# Patient Record
Sex: Female | Born: 1965 | Race: Black or African American | Hispanic: No | Smoking: Never smoker
Health system: Southern US, Community
[De-identification: ages and names within clinical notes are randomized; demographics above are authoritative.]

## PROBLEM LIST (undated history)

## (undated) DIAGNOSIS — T464X5A Adverse effect of angiotensin-converting-enzyme inhibitors, initial encounter: Secondary | ICD-10-CM

## (undated) DIAGNOSIS — R05 Cough: Secondary | ICD-10-CM

## (undated) DIAGNOSIS — Z862 Personal history of diseases of the blood and blood-forming organs and certain disorders involving the immune mechanism: Secondary | ICD-10-CM

## (undated) DIAGNOSIS — T7840XA Allergy, unspecified, initial encounter: Secondary | ICD-10-CM

## (undated) DIAGNOSIS — R7611 Nonspecific reaction to tuberculin skin test without active tuberculosis: Secondary | ICD-10-CM

## (undated) DIAGNOSIS — R042 Hemoptysis: Secondary | ICD-10-CM

## (undated) DIAGNOSIS — I10 Essential (primary) hypertension: Secondary | ICD-10-CM

## (undated) DIAGNOSIS — E0590 Thyrotoxicosis, unspecified without thyrotoxic crisis or storm: Secondary | ICD-10-CM

## (undated) HISTORY — DX: Allergy, unspecified, initial encounter: T78.40XA

## (undated) HISTORY — DX: Adverse effect of angiotensin-converting-enzyme inhibitors, initial encounter: T46.4X5A

## (undated) HISTORY — DX: Personal history of diseases of the blood and blood-forming organs and certain disorders involving the immune mechanism: Z86.2

## (undated) HISTORY — DX: Thyrotoxicosis, unspecified without thyrotoxic crisis or storm: E05.90

## (undated) HISTORY — DX: Cough: R05

## (undated) HISTORY — DX: Nonspecific reaction to tuberculin skin test without active tuberculosis: R76.11

## (undated) HISTORY — DX: Hemoptysis: R04.2

## (undated) HISTORY — PX: TUBAL LIGATION: SHX77

---

## 1997-05-17 ENCOUNTER — Encounter: Admission: RE | Admit: 1997-05-17 | Discharge: 1997-05-17 | Payer: Self-pay | Admitting: Family Medicine

## 1997-05-23 ENCOUNTER — Encounter: Admission: RE | Admit: 1997-05-23 | Discharge: 1997-05-23 | Payer: Self-pay | Admitting: Family Medicine

## 1997-05-31 ENCOUNTER — Encounter: Admission: RE | Admit: 1997-05-31 | Discharge: 1997-05-31 | Payer: Self-pay | Admitting: Family Medicine

## 1997-06-23 ENCOUNTER — Encounter: Admission: RE | Admit: 1997-06-23 | Discharge: 1997-06-23 | Payer: Self-pay | Admitting: Family Medicine

## 1997-07-12 ENCOUNTER — Encounter: Admission: RE | Admit: 1997-07-12 | Discharge: 1997-07-12 | Payer: Self-pay | Admitting: Family Medicine

## 1997-08-09 ENCOUNTER — Encounter: Admission: RE | Admit: 1997-08-09 | Discharge: 1997-08-09 | Payer: Self-pay | Admitting: Family Medicine

## 2000-03-19 ENCOUNTER — Encounter: Admission: RE | Admit: 2000-03-19 | Discharge: 2000-03-19 | Payer: Self-pay | Admitting: Family Medicine

## 2001-09-03 ENCOUNTER — Ambulatory Visit (HOSPITAL_COMMUNITY): Admission: RE | Admit: 2001-09-03 | Discharge: 2001-09-03 | Payer: Self-pay | Admitting: Obstetrics

## 2001-09-03 ENCOUNTER — Encounter: Payer: Self-pay | Admitting: Obstetrics

## 2001-11-03 ENCOUNTER — Encounter: Admission: RE | Admit: 2001-11-03 | Discharge: 2001-11-03 | Payer: Self-pay | Admitting: Obstetrics and Gynecology

## 2001-11-10 ENCOUNTER — Encounter: Admission: RE | Admit: 2001-11-10 | Discharge: 2001-11-10 | Payer: Self-pay | Admitting: *Deleted

## 2001-11-12 ENCOUNTER — Ambulatory Visit (HOSPITAL_COMMUNITY): Admission: RE | Admit: 2001-11-12 | Discharge: 2001-11-12 | Payer: Self-pay | Admitting: *Deleted

## 2001-11-17 ENCOUNTER — Encounter: Admission: RE | Admit: 2001-11-17 | Discharge: 2001-11-17 | Payer: Self-pay | Admitting: *Deleted

## 2001-11-24 ENCOUNTER — Encounter: Admission: RE | Admit: 2001-11-24 | Discharge: 2001-11-24 | Payer: Self-pay | Admitting: *Deleted

## 2001-11-25 ENCOUNTER — Encounter (HOSPITAL_COMMUNITY): Admission: AD | Admit: 2001-11-25 | Discharge: 2001-12-02 | Payer: Self-pay | Admitting: *Deleted

## 2001-12-01 ENCOUNTER — Encounter: Admission: RE | Admit: 2001-12-01 | Discharge: 2001-12-01 | Payer: Self-pay | Admitting: *Deleted

## 2001-12-04 ENCOUNTER — Inpatient Hospital Stay (HOSPITAL_COMMUNITY): Admission: AD | Admit: 2001-12-04 | Discharge: 2001-12-07 | Payer: Self-pay | Admitting: Family Medicine

## 2001-12-04 ENCOUNTER — Encounter (INDEPENDENT_AMBULATORY_CARE_PROVIDER_SITE_OTHER): Payer: Self-pay | Admitting: *Deleted

## 2001-12-09 ENCOUNTER — Inpatient Hospital Stay (HOSPITAL_COMMUNITY): Admission: AD | Admit: 2001-12-09 | Discharge: 2001-12-11 | Payer: Self-pay | Admitting: *Deleted

## 2002-06-30 ENCOUNTER — Encounter: Admission: RE | Admit: 2002-06-30 | Discharge: 2002-06-30 | Payer: Self-pay | Admitting: Internal Medicine

## 2002-06-30 ENCOUNTER — Encounter: Payer: Self-pay | Admitting: Internal Medicine

## 2002-11-20 ENCOUNTER — Emergency Department (HOSPITAL_COMMUNITY): Admission: EM | Admit: 2002-11-20 | Discharge: 2002-11-20 | Payer: Self-pay | Admitting: Emergency Medicine

## 2004-03-14 ENCOUNTER — Ambulatory Visit: Payer: Self-pay | Admitting: Family Medicine

## 2005-05-08 ENCOUNTER — Encounter (INDEPENDENT_AMBULATORY_CARE_PROVIDER_SITE_OTHER): Payer: Self-pay | Admitting: *Deleted

## 2005-05-08 ENCOUNTER — Ambulatory Visit: Payer: Self-pay | Admitting: Family Medicine

## 2005-05-12 ENCOUNTER — Ambulatory Visit (HOSPITAL_COMMUNITY): Admission: RE | Admit: 2005-05-12 | Discharge: 2005-05-12 | Payer: Self-pay | Admitting: *Deleted

## 2006-05-13 ENCOUNTER — Ambulatory Visit: Payer: Self-pay | Admitting: Obstetrics and Gynecology

## 2006-11-12 ENCOUNTER — Ambulatory Visit (HOSPITAL_COMMUNITY): Admission: RE | Admit: 2006-11-12 | Discharge: 2006-11-12 | Payer: Self-pay | Admitting: Family Medicine

## 2009-04-24 ENCOUNTER — Emergency Department (HOSPITAL_COMMUNITY): Admission: EM | Admit: 2009-04-24 | Discharge: 2009-04-24 | Payer: Self-pay | Admitting: Emergency Medicine

## 2010-01-30 ENCOUNTER — Ambulatory Visit
Admission: RE | Admit: 2010-01-30 | Discharge: 2010-01-30 | Payer: Self-pay | Source: Home / Self Care | Attending: Obstetrics and Gynecology | Admitting: Obstetrics and Gynecology

## 2010-02-04 LAB — POCT URINALYSIS DIPSTICK
Bilirubin Urine: NEGATIVE
Ketones, ur: NEGATIVE mg/dL
Nitrite: NEGATIVE
Protein, ur: NEGATIVE mg/dL
Specific Gravity, Urine: 1.025 (ref 1.005–1.030)
Urine Glucose, Fasting: NEGATIVE mg/dL
Urobilinogen, UA: 0.2 mg/dL (ref 0.0–1.0)
pH: 6 (ref 5.0–8.0)

## 2010-02-14 ENCOUNTER — Ambulatory Visit: Admit: 2010-02-14 | Payer: Self-pay | Admitting: Obstetrics and Gynecology

## 2010-03-15 ENCOUNTER — Encounter (HOSPITAL_COMMUNITY): Payer: Self-pay | Admitting: Radiology

## 2010-03-15 ENCOUNTER — Inpatient Hospital Stay (HOSPITAL_COMMUNITY)
Admission: EM | Admit: 2010-03-15 | Discharge: 2010-03-19 | DRG: 204 | Disposition: A | Discharge status: Home / Self Care | Payer: Medicaid Other | Attending: Pulmonary Disease | Admitting: Pulmonary Disease

## 2010-03-15 ENCOUNTER — Emergency Department (HOSPITAL_COMMUNITY): Payer: Medicaid Other

## 2010-03-15 DIAGNOSIS — J189 Pneumonia, unspecified organism: Secondary | ICD-10-CM

## 2010-03-15 DIAGNOSIS — Z7982 Long term (current) use of aspirin: Secondary | ICD-10-CM

## 2010-03-15 DIAGNOSIS — I10 Essential (primary) hypertension: Secondary | ICD-10-CM | POA: Diagnosis present

## 2010-03-15 DIAGNOSIS — R042 Hemoptysis: Principal | ICD-10-CM | POA: Diagnosis present

## 2010-03-15 DIAGNOSIS — R7611 Nonspecific reaction to tuberculin skin test without active tuberculosis: Secondary | ICD-10-CM | POA: Diagnosis present

## 2010-03-15 DIAGNOSIS — Z23 Encounter for immunization: Secondary | ICD-10-CM

## 2010-03-15 DIAGNOSIS — R918 Other nonspecific abnormal finding of lung field: Secondary | ICD-10-CM | POA: Diagnosis present

## 2010-03-15 HISTORY — DX: Essential (primary) hypertension: I10

## 2010-03-15 LAB — CBC
HCT: 28.9 % — ABNORMAL LOW (ref 36.0–46.0)
Hemoglobin: 8.6 g/dL — ABNORMAL LOW (ref 12.0–15.0)
MCH: 21.6 pg — ABNORMAL LOW (ref 26.0–34.0)
MCHC: 29.8 g/dL — ABNORMAL LOW (ref 30.0–36.0)
MCV: 72.6 fL — ABNORMAL LOW (ref 78.0–100.0)
Platelets: 246 10*3/uL (ref 150–400)
RBC: 3.98 MIL/uL (ref 3.87–5.11)
RDW: 16.8 % — ABNORMAL HIGH (ref 11.5–15.5)
WBC: 10.9 10*3/uL — ABNORMAL HIGH (ref 4.0–10.5)

## 2010-03-15 LAB — DIFFERENTIAL
Basophils Absolute: 0 10*3/uL (ref 0.0–0.1)
Basophils Relative: 0 % (ref 0–1)
Eosinophils Absolute: 0 10*3/uL (ref 0.0–0.7)
Eosinophils Relative: 0 % (ref 0–5)
Lymphocytes Relative: 11 % — ABNORMAL LOW (ref 12–46)
Lymphs Abs: 1.2 10*3/uL (ref 0.7–4.0)
Monocytes Absolute: 0.5 10*3/uL (ref 0.1–1.0)
Monocytes Relative: 5 % (ref 3–12)
Neutro Abs: 9.2 10*3/uL — ABNORMAL HIGH (ref 1.7–7.7)
Neutrophils Relative %: 84 % — ABNORMAL HIGH (ref 43–77)

## 2010-03-15 LAB — POCT I-STAT, CHEM 8
BUN: 11 mg/dL (ref 6–23)
Calcium, Ion: 1.17 mmol/L (ref 1.12–1.32)
Chloride: 105 mEq/L (ref 96–112)
Creatinine, Ser: 0.7 mg/dL (ref 0.4–1.2)
Glucose, Bld: 152 mg/dL — ABNORMAL HIGH (ref 70–99)
HCT: 30 % — ABNORMAL LOW (ref 36.0–46.0)
Hemoglobin: 10.2 g/dL — ABNORMAL LOW (ref 12.0–15.0)
Potassium: 3.7 mEq/L (ref 3.5–5.1)
Sodium: 141 mEq/L (ref 135–145)
TCO2: 22 mmol/L (ref 0–100)

## 2010-03-15 LAB — COMPREHENSIVE METABOLIC PANEL
ALT: 41 U/L — ABNORMAL HIGH (ref 0–35)
AST: 33 U/L (ref 0–37)
Albumin: 2.9 g/dL — ABNORMAL LOW (ref 3.5–5.2)
Alkaline Phosphatase: 58 U/L (ref 39–117)
BUN: 9 mg/dL (ref 6–23)
CO2: 24 mEq/L (ref 19–32)
Calcium: 8.9 mg/dL (ref 8.4–10.5)
Chloride: 108 mEq/L (ref 96–112)
Creatinine, Ser: 0.66 mg/dL (ref 0.4–1.2)
GFR calc Af Amer: >60 mL/min (ref >60)
GFR calc non Af Amer: >60 mL/min (ref >60)
Glucose, Bld: 85 mg/dL (ref 70–99)
Potassium: 3.7 mEq/L (ref 3.5–5.1)
Sodium: 141 mEq/L (ref 135–145)
Total Bilirubin: 0.3 mg/dL (ref 0.3–1.2)
Total Protein: 6.3 g/dL (ref 6.0–8.3)

## 2010-03-15 LAB — RAPID URINE DRUG SCREEN, HOSP PERFORMED
Amphetamines: NOT DETECTED
Barbiturates: NOT DETECTED
Benzodiazepines: NOT DETECTED
Cocaine: NOT DETECTED
Opiates: NOT DETECTED
Tetrahydrocannabinol: NOT DETECTED

## 2010-03-15 LAB — BRAIN NATRIURETIC PEPTIDE: Pro B Natriuretic peptide (BNP): 183 pg/mL — ABNORMAL HIGH (ref 0.0–100.0)

## 2010-03-15 LAB — POCT CARDIAC MARKERS
CKMB, poc: <1 ng/mL — ABNORMAL LOW (ref 1.0–8.0)
Myoglobin, poc: 73.4 ng/mL (ref 12–200)
Troponin i, poc: <0.05 ng/mL (ref 0.00–0.09)

## 2010-03-15 LAB — ABO/RH: ABO/RH(D): B POS

## 2010-03-15 LAB — PROTIME-INR
INR: 1.09 (ref 0.00–1.49)
Prothrombin Time: 14.3 seconds (ref 11.6–15.2)

## 2010-03-15 LAB — SEDIMENTATION RATE: Sed Rate: 30 mm/hr — ABNORMAL HIGH (ref 0–22)

## 2010-03-15 LAB — RHEUMATOID FACTOR: Rheumatoid fact SerPl-aCnc: <10 IU/mL (ref <=14)

## 2010-03-15 LAB — D-DIMER, QUANTITATIVE: D-Dimer, Quant: 0.9 ug/mL-FEU — ABNORMAL HIGH (ref 0.00–0.48)

## 2010-03-15 LAB — APTT: aPTT: 24 seconds (ref 24–37)

## 2010-03-15 MED ORDER — IOHEXOL 300 MG/ML  SOLN
100.0000 mL | Freq: Once | INTRAMUSCULAR | Status: AC | PRN
  Administered 2010-03-15: 100 mL via INTRAVENOUS

## 2010-03-15 MED ORDER — IOHEXOL 350 MG/ML SOLN
100.0000 mL | Freq: Once | INTRAVENOUS | Status: AC | PRN
  Administered 2010-03-15: 100 mL via INTRAVENOUS

## 2010-03-15 MED ORDER — IOHEXOL 300 MG/ML  SOLN: 100.0000 mL | Freq: Once | INTRAMUSCULAR | Status: AC | PRN

## 2010-03-16 ENCOUNTER — Inpatient Hospital Stay (HOSPITAL_COMMUNITY): Payer: Medicaid Other

## 2010-03-16 LAB — URINALYSIS, ROUTINE W REFLEX MICROSCOPIC
Bilirubin Urine: NEGATIVE
Hgb urine dipstick: NEGATIVE
Specific Gravity, Urine: 1.017 (ref 1.005–1.030)
pH: 7.5 (ref 5.0–8.0)

## 2010-03-17 LAB — DIFFERENTIAL
Basophils Relative: 0 % (ref 0–1)
Lymphocytes Relative: 30 % (ref 12–46)
Monocytes Relative: 11 % (ref 3–12)
Neutro Abs: 4.5 10*3/uL (ref 1.7–7.7)

## 2010-03-17 LAB — CBC
Hemoglobin: 8 g/dL — ABNORMAL LOW (ref 12.0–15.0)
MCH: 21.3 pg — ABNORMAL LOW (ref 26.0–34.0)
MCV: 72.3 fL — ABNORMAL LOW (ref 78.0–100.0)
RBC: 3.75 MIL/uL — ABNORMAL LOW (ref 3.87–5.11)

## 2010-03-17 LAB — C4 COMPLEMENT: Complement C4, Body Fluid: 29 mg/dL (ref 16–47)

## 2010-03-18 ENCOUNTER — Inpatient Hospital Stay (HOSPITAL_COMMUNITY): Payer: Medicaid Other

## 2010-03-18 ENCOUNTER — Encounter (HOSPITAL_COMMUNITY): Payer: Self-pay

## 2010-03-18 DIAGNOSIS — R7611 Nonspecific reaction to tuberculin skin test without active tuberculosis: Secondary | ICD-10-CM

## 2010-03-18 LAB — BASIC METABOLIC PANEL
CO2: 25 mEq/L (ref 19–32)
Calcium: 9 mg/dL (ref 8.4–10.5)
Chloride: 110 mEq/L (ref 96–112)
Glucose, Bld: 97 mg/dL (ref 70–99)
Sodium: 139 mEq/L (ref 135–145)

## 2010-03-18 LAB — STREP PNEUMONIAE URINARY ANTIGEN: Strep Pneumo Urinary Antigen: NEGATIVE

## 2010-03-18 LAB — CBC
HCT: 27.9 % — ABNORMAL LOW (ref 36.0–46.0)
Hemoglobin: 8.3 g/dL — ABNORMAL LOW (ref 12.0–15.0)
MCHC: 29.7 g/dL — ABNORMAL LOW (ref 30.0–36.0)
RBC: 3.83 MIL/uL — ABNORMAL LOW (ref 3.87–5.11)

## 2010-03-19 LAB — CROSSMATCH
ABO/RH(D): B POS
Antibody Screen: NEGATIVE
Unit division: 0
Unit division: 0

## 2010-03-19 LAB — ANTI-NEUTROPHIL ANTIBODY

## 2010-03-19 LAB — LEGIONELLA ANTIGEN, URINE

## 2010-03-22 LAB — CULTURE, BLOOD (ROUTINE X 2)
Culture  Setup Time: 201202250240
Culture: NO GROWTH

## 2010-03-23 LAB — CULTURE, BLOOD (ROUTINE X 2)
Culture  Setup Time: 201202262354
Culture: NO GROWTH

## 2010-03-28 ENCOUNTER — Encounter: Payer: Self-pay | Admitting: Internal Medicine

## 2010-03-28 ENCOUNTER — Inpatient Hospital Stay (INDEPENDENT_AMBULATORY_CARE_PROVIDER_SITE_OTHER): Payer: Self-pay | Admitting: Internal Medicine

## 2010-03-28 DIAGNOSIS — I5030 Unspecified diastolic (congestive) heart failure: Secondary | ICD-10-CM | POA: Insufficient documentation

## 2010-03-28 DIAGNOSIS — R042 Hemoptysis: Secondary | ICD-10-CM

## 2010-03-28 DIAGNOSIS — R079 Chest pain, unspecified: Secondary | ICD-10-CM

## 2010-03-28 DIAGNOSIS — R0602 Shortness of breath: Secondary | ICD-10-CM

## 2010-03-28 DIAGNOSIS — I10 Essential (primary) hypertension: Secondary | ICD-10-CM | POA: Insufficient documentation

## 2010-03-28 HISTORY — DX: Hemoptysis: R04.2

## 2010-04-02 NOTE — Assessment & Plan Note (Addendum)
Summary: hfu per steve/mhh   Visit Type:  Hospital Follow-up Primary Provider/Referring Provider:  Dr. Sylvester Harder  CC:  HFU.  Pt c/o chest pain on left side and  SOB. Marland Kitchen  History of Present Illness: March  48, 578  44 year old lady. 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 Knonw hypertension untreated x several years. 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 Workup negative (see spl past med) except noted to have hypertension and diasth chf and mild MR on echo. PPD was positive again as expected though but pulmnary inflrates rapidly improvd in hospital and she did not have B symptoms - this made [place] diagnose diast chf due to obesity and BP as the etiology.  Dc;ed on antihypertensive.Now here for followup  Today she states that  she is still very dyspneic. Feels it somewha worrse after dc. Dyspnea for climbing few stairs. No further hemoptysis. Compliant with labetalol. Also, new left infraclavicular pain for 3 days, constant, made worse with raising hands and radiationg to back, moderate in severity. No relieving factors. No associated edema, palpitaitons  Preventive Screening-Counseling & Management  Alcohol-Tobacco     Smoking Status: never  Current Medications (verified): 1)  Labetalol Hcl 200 Mg Tabs (Labetalol Hcl) .... Take 1 Tablet By Mouth Two Times A Day  Allergies (verified): No Known Drug Allergies  Past History:  Past medical, surgical, family and social histories (including risk factors) reviewed, and no changes noted (except as noted below).  Past Medical History: Hypertension  - diagnosed several decades ago  - untreated till admission Feb 2012 Diast CHF  - on echo feb 2012 - new Hemoptysis due to alv hge   - feb 2012. ANA negative. Deemed due to dias chf PPD positive  - negative during childhood in [facility]  - first positive test 1993 during immigration green card clearance. CXR normal 1993 per hx  - s/p INR Rx at [facility] - 6 months  - repeat positive feb 2012  Past Surgical History: c-section  Past Pulmonary History:  Pulmonary History: CT scan showed diffuse bilateral alveolar infiltrates.   Hemoglobin 8.3, hematocrit 27.9, platelets 263, WBC 6.3.  Sodium 139,   potassium 3.5, chloride 110, CO2 is 25, BUN is 5, creatinine 0.61,   glucose 97, albumin is 2.9, calcium is 8.9.  Urine Legionella is   negative.  Strep urine is negative.  Blood cultures showed no growth.   AFBs are pending.  Blood culture showed no growth.  PPD was positive.   Complement C3 was 142, C4 was 429.  Urine drug screen was negative.  HIV   was negative.  BNP was 183.  D-dimer is 0.90 and sed rate was 30.  RA   factor was less than 10.  HIV was nonreactive.  ANA was negative.   Cardiac markers were negative.  Myoglobin 73.9.    Note that her PPD was   positive.   ECHO - diast dysfn  Family History: Reviewed history and no changes required. Father-HTN  Social History: Reviewed history and no changes required. Patient never smoked.  Married has children Smoking Status:  never  Review of Systems       The patient complains of shortness of breath with activity and chest pain.  The patient denies shortness of breath at rest, productive cough, non-productive cough, coughing up blood, irregular heartbeats, acid heartburn, indigestion, loss of appetite, weight change, abdominal pain, difficulty swallowing, sore throat, tooth/dental problems,  headaches, nasal congestion/difficulty breathing through nose, sneezing, itching, ear ache, anxiety, depression, hand/feet swelling, joint stiffness or pain, rash, change in color of mucus, and fever.    Vital Signs:  Patient profile:   45 year old female Height:      65 inches Weight:      221.13 pounds BMI:     36.93 O2 Sat:      99 % on Room air Temp:     98.5 degrees F oral Pulse rate:   87 / minute BP sitting:   142 / 78  (right arm) Cuff size:   regular  Vitals  Entered By: Carron Curie CMA (March 28, 2010 11:16 AM)  O2 Flow:  Room air CC: HFU.  Pt c/o chest pain on left side and  SOB.  Comments Medications reviewed with patient Carron Curie CMA  March 28, 2010 11:17 AM Daytime phone number verified with patient.    Physical Exam  General:  well developed, well nourished, in no acute distressobese.   Head:  normocephalic and atraumatic Eyes:  PERRLA/EOM intact; conjunctiva and sclera clear Ears:  TMs intact and clear with normal canals Nose:  no deformity, discharge, inflammation, or lesions Mouth:  no deformity or lesions Neck:  no masses, thyromegaly, or abnormal cervical nodes Chest Wall:  left rib side tender Lungs:  clear bilaterally to auscultation and percussion Heart:  regular rhythm and normal rate.  systolic mumru + Abdomen:  bowel sounds positive; abdomen soft and non-tender without masses, or organomegaly Msk:  no deformity or scoliosis noted with normal posture Pulses:  pulses normal Extremities:  no clubbing, cyanosis, edema, or deformity noted Neurologic:  CN II-XII grossly intact with normal reflexes, coordination, muscle strength and tone Skin:  intact without lesions or rashes Cervical Nodes:  no significant adenopathy Axillary Nodes:  no significant adenopathy Psych:  alert and cooperative; normal mood and affect; normal attention span and concentration   Impression & Recommendations:  Problem # 1:  UNSPECIFIED DIASTOLIC HEART FAILURE (ICD-428.30) Assessment New clinically better  plan refer cards Her updated medication list for this problem includes:    Labetalol Hcl 200 Mg Tabs (Labetalol hcl) .Marland Kitchen... Take 1 tablet by mouth two times a day  Orders: Cardiology Referral (Cardiology) Est. Patient Level IV (99214)Future Orders: T-2 View CXR (71020TC) ... 04/18/2010  Problem # 2:  CHEST PAIN (ICD-786.50) Assessment: New atypoical. had ruled out for Mi in hospital  plan Orders: Cardiology Referral  (Cardiology) Est. Patient Level IV (45409)  Problem # 3:  DYSPNEA (ICD-786.05) Assessment: New ?du eto diast chf and mumrur  plan repeat cxr in 1 month cards referral Orders: Cardiology Referral (Cardiology) Est. Patient Level IV (99214)Future Orders: T-2 View CXR (71020TC) ... 04/18/2010  Problem # 4:  HEMOPTYSIS UNSPECIFIED (ICD-786.30) Assessment: Improved  this was proba due to diast chf. The PPD positive is a non issue I Think. I wil do repeat cxr end of month if clear, the PPD positive should be a non issue esp if she is asymptomatic and cards feels primary cards issue of bp and diast chf  Orders: Est. Patient Level IV (81191)  Medications Added to Medication List This Visit: 1)  Labetalol Hcl 200 Mg Tabs (Labetalol hcl) .... Take 1 tablet by mouth two times a day  Patient Instructions: 1)  my nurse will walk you for oxygen levels now 2)  please see [place] cardiologist 3)  please have cxr in 1 month 4)  call me in 1 monthafter  to report progress  5)   - depending on symptoms I might have you come in for visit   Immunization History:  Influenza Immunization History:    Influenza:  historical (03/21/2010)  Appended Document: hfu per steve/mhh Ambulatory Pulse Oximetry  Resting; HR__85___    02 Sat_100____  Lap1 (185 feet)   HR_107____   02 Sat___97__ Lap2 (185 feet)   HR__103___   02 Sat__98___    Lap3 (185 feet)   HR__102___   02 Sat__97___  _x__Test Completed without Difficulty ___Test Stopped due to:   Appended Document: hfu per steve/mhh given changes in EMR. just have her come in 1 monthw ith cxr  Appended Document: hfu per steve/mhh order placed for cxr in 1 month an dov.

## 2010-04-04 ENCOUNTER — Inpatient Hospital Stay (HOSPITAL_COMMUNITY)
Admission: EM | Admit: 2010-04-04 | Discharge: 2010-04-07 | DRG: 291 | Disposition: A | Discharge status: Home / Self Care | Payer: Medicaid Other | Attending: Internal Medicine | Admitting: Internal Medicine

## 2010-04-04 ENCOUNTER — Emergency Department (HOSPITAL_COMMUNITY): Payer: Medicaid Other

## 2010-04-04 DIAGNOSIS — I509 Heart failure, unspecified: Secondary | ICD-10-CM | POA: Diagnosis present

## 2010-04-04 DIAGNOSIS — J189 Pneumonia, unspecified organism: Secondary | ICD-10-CM | POA: Diagnosis present

## 2010-04-04 DIAGNOSIS — R911 Solitary pulmonary nodule: Secondary | ICD-10-CM

## 2010-04-04 DIAGNOSIS — R7611 Nonspecific reaction to tuberculin skin test without active tuberculosis: Secondary | ICD-10-CM | POA: Diagnosis present

## 2010-04-04 DIAGNOSIS — J9600 Acute respiratory failure, unspecified whether with hypoxia or hypercapnia: Secondary | ICD-10-CM | POA: Diagnosis present

## 2010-04-04 DIAGNOSIS — I5023 Acute on chronic systolic (congestive) heart failure: Principal | ICD-10-CM | POA: Diagnosis present

## 2010-04-04 DIAGNOSIS — I10 Essential (primary) hypertension: Secondary | ICD-10-CM | POA: Diagnosis present

## 2010-04-04 DIAGNOSIS — D509 Iron deficiency anemia, unspecified: Secondary | ICD-10-CM | POA: Diagnosis present

## 2010-04-04 DIAGNOSIS — I5030 Unspecified diastolic (congestive) heart failure: Secondary | ICD-10-CM

## 2010-04-04 DIAGNOSIS — E876 Hypokalemia: Secondary | ICD-10-CM | POA: Diagnosis not present

## 2010-04-04 DIAGNOSIS — I498 Other specified cardiac arrhythmias: Secondary | ICD-10-CM | POA: Diagnosis present

## 2010-04-04 LAB — IRON AND TIBC
Saturation Ratios: 3 % — ABNORMAL LOW (ref 20–55)
UIBC: 286 ug/dL

## 2010-04-04 LAB — URINALYSIS, ROUTINE W REFLEX MICROSCOPIC
Bilirubin Urine: NEGATIVE
Glucose, UA: NEGATIVE mg/dL
Hgb urine dipstick: NEGATIVE
Specific Gravity, Urine: 1.021 (ref 1.005–1.030)
Urobilinogen, UA: 0.2 mg/dL (ref 0.0–1.0)

## 2010-04-04 LAB — COMPREHENSIVE METABOLIC PANEL
AST: 54 U/L — ABNORMAL HIGH (ref 0–37)
Albumin: 3 g/dL — ABNORMAL LOW (ref 3.5–5.2)
BUN: 17 mg/dL (ref 6–23)
Chloride: 108 mEq/L (ref 96–112)
Creatinine, Ser: 0.67 mg/dL (ref 0.4–1.2)
GFR calc Af Amer: >60 mL/min (ref >60)
Potassium: 4.1 mEq/L (ref 3.5–5.1)
Total Bilirubin: 0.5 mg/dL (ref 0.3–1.2)
Total Protein: 6.5 g/dL (ref 6.0–8.3)

## 2010-04-04 LAB — CARDIAC PANEL(CRET KIN+CKTOT+MB+TROPI)
CK, MB: 2.3 ng/mL (ref 0.3–4.0)
Relative Index: 1 (ref 0.0–2.5)
Total CK: 223 U/L — ABNORMAL HIGH (ref 7–177)
Troponin I: 0.04 ng/mL (ref 0.00–0.06)

## 2010-04-04 LAB — CBC
MCH: 20.8 pg — ABNORMAL LOW (ref 26.0–34.0)
MCV: 72 fL — ABNORMAL LOW (ref 78.0–100.0)
Platelets: 366 10*3/uL (ref 150–400)
RBC: 4.04 MIL/uL (ref 3.87–5.11)
RDW: 17.3 % — ABNORMAL HIGH (ref 11.5–15.5)
WBC: 13.8 10*3/uL — ABNORMAL HIGH (ref 4.0–10.5)

## 2010-04-04 LAB — DIFFERENTIAL
Basophils Relative: 0 % (ref 0–1)
Eosinophils Absolute: 0.2 10*3/uL (ref 0.0–0.7)
Eosinophils Relative: 2 % (ref 0–5)
Neutrophils Relative %: 75 % (ref 43–77)

## 2010-04-04 LAB — PROCALCITONIN: Procalcitonin: 3.98 ng/mL

## 2010-04-04 LAB — TROPONIN I: Troponin I: 0.05 ng/mL (ref 0.00–0.06)

## 2010-04-04 LAB — VITAMIN B12: Vitamin B-12: 227 pg/mL (ref 211–911)

## 2010-04-04 LAB — GLUCOSE, CAPILLARY: Glucose-Capillary: 87 mg/dL (ref 70–99)

## 2010-04-04 LAB — CK TOTAL AND CKMB (NOT AT ARMC)
CK, MB: 2.5 ng/mL (ref 0.3–4.0)
Total CK: 238 U/L — ABNORMAL HIGH (ref 7–177)

## 2010-04-04 NOTE — Discharge Summary (Signed)
[name], Shields                   ACCOUNT NO.:  [id number]  MEDICAL RECORD NO.:  [id number]           PATIENT TYPE:  I  LOCATION:  5507                         FACILITY:  [facility]  PHYSICIAN:  [name], MD   DATE OF BIRTH:  1965-07-07  DATE OF ADMISSION:  03/15/2010 DATE OF DISCHARGE:  03/19/2010                              DISCHARGE SUMMARY   DISCHARGE DIAGNOSES:  Consist of alveolar hemorrhage and bilateral pulmonary infiltrates.  HISTORY OF PRESENT ILLNESS:  [name] is a 45 year old African female who has her origins in [place], who presented to the emergency room on March 15, 2010, with sudden onset alveolar hemorrhage, coughing up blood hemoptysis.  She was evaluated in the emergency room and decided to be admitted to the Pulmonary Critical Care Service for further evaluation and treatment.  LABORATORY DATA:  CT scan showed diffuse bilateral alveolar infiltrates. Hemoglobin 8.3, hematocrit 27.9, platelets 263, WBC 6.3.  Sodium 139, potassium 3.5, chloride 110, CO2 is 25, BUN is 5, creatinine 0.61, glucose 97, albumin is 2.9, calcium is 8.9.  Urine Legionella is negative.  Strep urine is negative.  Blood cultures showed no growth. AFBs are pending.  Blood culture showed no growth.  PPD was positive. Complement C3 was 142, C4 was 429.  Urine drug screen was negative.  HIV was negative.  BNP was 183.  D-dimer is 0.90 and sed rate was 30.  RA factor was less than 10.  HIV was nonreactive.  ANA was negative. Cardiac markers were negative.  Myoglobin 73.9.  Note that her PPD was positive but she has prior hx of PPD positive and Rx for Latent TB  Echo on day of dischargte showed diastolic dysfunction.  HOSPITAL COURSE BY DISCHARGE DIAGNOSES: 1. Alveolar hemorrhage.  She was admitted to [facility].  CT     scan showed diffuse bilateral alveolar hemorrhage.  The notion of     fiberoptic bronchoscopy was entertained and then dismissed by Dr.     [name] due to rapid improvement. Autoimmune workup negative. ECHO at discharge showed diastolic dysfn ad this with untread hypertension was felt to be reason for alveolar hemorrhage. Dr Marchelle Gearing wil set up opd cards appointment. Empiric  doxycycline was given for 3 more days  2.  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 Due to clearance of cxr it was felt this could be evaluated as outpatient  3. For hypertension, she was placed on low salt.  Again, she will     follow up with Dr. [name] in an outpatient basis.  Her medication reconciliation sheet as her discharge medications.  Her diet is a heart-healthy diet.  DISPOSITION/CONDITION ON DISCHARGE:  Improved.> 30 minutes discharge planning   Devra Dopp, MSN, ACNP   ______________________________ [name], MD    SM/MEDQ  D:  03/19/2010  T:  03/20/2010  Job:  [id number]  Electronically Signed by Devra Dopp MSN ACNP on 03/29/2010 03:25:37 PM Electronically Signed by [name] MD on 04/04/2010  01:08:42 PM

## 2010-04-05 ENCOUNTER — Inpatient Hospital Stay (HOSPITAL_COMMUNITY): Payer: Medicaid Other

## 2010-04-05 DIAGNOSIS — I10 Essential (primary) hypertension: Secondary | ICD-10-CM

## 2010-04-05 DIAGNOSIS — I5033 Acute on chronic diastolic (congestive) heart failure: Secondary | ICD-10-CM

## 2010-04-05 DIAGNOSIS — J159 Unspecified bacterial pneumonia: Secondary | ICD-10-CM

## 2010-04-05 LAB — COMPREHENSIVE METABOLIC PANEL
ALT: 57 U/L — ABNORMAL HIGH (ref 0–35)
Alkaline Phosphatase: 52 U/L (ref 39–117)
BUN: 8 mg/dL (ref 6–23)
Chloride: 109 mEq/L (ref 96–112)
Glucose, Bld: 120 mg/dL — ABNORMAL HIGH (ref 70–99)
Potassium: 3.2 mEq/L — ABNORMAL LOW (ref 3.5–5.1)
Sodium: 139 mEq/L (ref 135–145)
Total Bilirubin: 0.5 mg/dL (ref 0.3–1.2)

## 2010-04-05 LAB — CBC
HCT: 25.9 % — ABNORMAL LOW (ref 36.0–46.0)
Hemoglobin: 7.8 g/dL — ABNORMAL LOW (ref 12.0–15.0)
MCH: 21.4 pg — ABNORMAL LOW (ref 26.0–34.0)
MCV: 71.2 fL — ABNORMAL LOW (ref 78.0–100.0)
RBC: 3.64 MIL/uL — ABNORMAL LOW (ref 3.87–5.11)
WBC: 8.1 10*3/uL (ref 4.0–10.5)

## 2010-04-07 LAB — BRAIN NATRIURETIC PEPTIDE: Pro B Natriuretic peptide (BNP): 76 pg/mL (ref 0.0–100.0)

## 2010-04-07 LAB — BASIC METABOLIC PANEL
BUN: 8 mg/dL (ref 6–23)
CO2: 26 mEq/L (ref 19–32)
Calcium: 9.3 mg/dL (ref 8.4–10.5)
Chloride: 108 mEq/L (ref 96–112)
Creatinine, Ser: 0.56 mg/dL (ref 0.4–1.2)
GFR calc Af Amer: >60 mL/min (ref >60)

## 2010-04-08 LAB — SJOGRENS SYNDROME-B EXTRACTABLE NUCLEAR ANTIBODY: SSB (La) (ENA) Antibody, IgG: <1 AU/mL (ref <30)

## 2010-04-08 LAB — SJOGRENS SYNDROME-A EXTRACTABLE NUCLEAR ANTIBODY: SSA (Ro) (ENA) Antibody, IgG: 26 AU/mL (ref <30)

## 2010-04-09 ENCOUNTER — Telehealth: Payer: Self-pay | Admitting: *Deleted

## 2010-04-09 NOTE — Consult Note (Signed)
[name], [name]                   ACCOUNT NO.:  [id number]  MEDICAL RECORD NO.:  [id number]           PATIENT TYPE:  I  LOCATION:  4506                         FACILITY:  [facility]  PHYSICIAN:  Veverly Fells. Excell Seltzer, MD  DATE OF BIRTH:  11/25/1965  DATE OF CONSULTATION:  04/04/2010 DATE OF DISCHARGE:                                CONSULTATION   REQUESTING PHYSICIAN:  Oretha Milch, MD  REASON FOR CONSULTATION:  Diastolic heart failure.  HISTORY OF PRESENT ILLNESS:  [name] is a 45 year old African woman who presented early this morning with subacute onset of shortness of breath and cough.  She was noted to have bilateral pulmonary infiltrates on chest x-ray and she also had severe hypertension at presentation with blood pressure greater than 200/100.  The patient has been recently hospitalized with alveolar hemorrhage in February 2012.  This was presumed to be secondary to diastolic dysfunction and uncontrolled hypertension.  She was scheduled for an outpatient cardiology evaluation, but again was admitted within a few weeks of her last discharge.  The patient states that she had been feeling a little better since her previous hospitalization, but she reports dyspnea with fairly minimal exertion.  She becomes dyspneic with walking short distances on level ground.  She also complains of orthopnea.  She denies PND or edema.  She has chest pain only with coughing and deep inspiration.  She denies fevers or chills.  She has been coughing up white sputum.  The patient has a history of hypertension, but was not on medications for quite a long time.  Since discharge from the hospital, she has been taking labetalol 200 mg twice daily.  During her recent hospitalization, she underwent workup for autoimmune disease.  This was all negative.  She has a positive PPD, but this has been present for years and she is completed a course of treatment for latent tuberculosis.  The patient denies any  further hemoptysis at the time of this admission.  PAST MEDICAL HISTORY: 1. Hypertension. 2. Recent alveolar hemorrhage with bilateral pulmonary infiltrates. 3. History of a C-section.  MEDICATIONS:  Labetalol 200 mg twice daily.  ALLERGIES:  NKDA.  SOCIAL HISTORY:  The patient lives locally in [place].  She works as a Social worker.  She does not smoke cigarettes, drink alcohol, or use illicit drugs.  FAMILY HISTORY:  Negative for coronary artery disease, diabetes, or stroke.  REVIEW OF SYSTEMS:  Negative except as per HPI.  PHYSICAL EXAMINATION:  GENERAL:  The patient is alert and oriented.  She is in no respiratory distress.  She appears comfortable at rest. VITAL SIGNS:  Temperature of 98.3, heart rate of 105, blood pressure 162/80, oxygen saturation 96% on 2 liters of oxygen per nasal cannula, respiratory rate is 30. HEENT:  Normal. NECK:  No carotid bruits.  No thyromegaly.  Jugular venous pressure is 7 cm. LUNGS:  Inspiratory rales bilaterally, right greater than left. CARDIOVASCULAR:  The apex is discreet, nondisplaced.  The heart is tachycardic and regular with a 2/6 systolic ejection murmur along the left sternal border.  There are no diastolic murmurs  or gallops. ABDOMEN:  Soft and nontender.  No organomegaly.  No abdominal bruits. BACK:  No CVA tenderness. EXTREMITIES:  No clubbing, cyanosis, or edema.  Peripheral pulses are 2+ and equal throughout. SKIN:  Warm and dry without rash. NEUROLOGIC:  Cranial nerves II-XII are intact.  Strength is intact and equal bilaterally.  Review of telemetry demonstrates sinus rhythm and sinus tachycardia.  No arrhythmias are present.  A 12-lead EKG shows sinus tachycardia with a heart rate of 100 beats per minute.  There are no other abnormalities seen.  LABORATORY DATA:  A total CK of 223, MB of 2.3, and troponin of 0.04. BNP is 332.  Urine HCG is negative.  Troponin is 0.05, creatinine is 0.67, and potassium is 4.1.   White blood cell count is 13.8, hemoglobin is 8.4, and platelet count is 366,000.  Echocardiogram from March 19, 2010, shows moderate concentric left ventricular hypertrophy with normal left ventricular ejection fraction of 55-60%.  There were no regional wall motion abnormalities.  Doppler parameters were consistent with grade 1 diastolic dysfunction.  There was thickening of the mitral leaflets, but only trace to mild mitral regurgitation.  No other significant valvular disease was noted.  There was no pericardial effusion.  FINAL ASSESSMENT:  This is a 45 year old woman with recurrent bilateral pulmonary infiltrates and associated dyspnea with cough.  I am not sure whether acute on chronic diastolic heart failure is the only cause of her symptoms, but I think at a minimum it is contributing to her presentation.  She has had a very brisk diuresis with an initial dose of intravenous Lasix and has already put out 3 liters of urine within the first 12 hours of her hospitalization.  I think serial chest radiography will help determine the etiology of her symptoms.  If she has rapid clearing of her pulmonary infiltrates, I think this favors cardiogenic pulmonary edema.  Recommend continuation of labetalol 200 mg twice daily.  I would recommend starting the patient on furosemide 20 mg orally every day as well.  She may require a second antihypertensive medication and I think amlodipine would be a good choice.  We will follow along with you to assess her initial response to the therapies as outlined above.  Pneumonitis may also be contributing to her symptoms and the Pulmonary Team is evaluating her as well.  Thank you for the opportunity to see this nice patient.  Please feel free to call at anytime with questions.  We will follow along throughout her hospitalization.     Veverly Fells. Excell Seltzer, MD     MDC/MEDQ  D:  04/04/2010  T:  04/05/2010  Job:  [id number]  cc:   Fleet Contras,  M.D.  Electronically Signed by Tonny Bollman MD on 04/09/2010 06:04:23 PM

## 2010-04-09 NOTE — Telephone Encounter (Signed)
[email or web address] to set pt for a 1 month appt. Carron Curie, MA

## 2010-04-10 LAB — CULTURE, BLOOD (ROUTINE X 2)
Culture  Setup Time: 201203151019
Culture: NO GROWTH

## 2010-04-11 NOTE — Discharge Summary (Signed)
[name], [name]                   ACCOUNT NO.:  [id number]  MEDICAL RECORD NO.:  [id number]           PATIENT TYPE:  LOCATION:                                 FACILITY:  PHYSICIAN:  [name], M.D.DATE OF BIRTH:  [birth year]  DATE OF ADMISSION: DATE OF DISCHARGE:                              DISCHARGE SUMMARY   PRIMARY CARE DOCTOR:  None.  PULMONOLOGIST:  [name], MD  DISCHARGE DIAGNOSES: 1. Healthcare-associated pneumonia. 2. Acute hypoxic respiratory failure. 3. Iron deficiency anemia. 4. Acute diastolic heart failure.  DISCHARGE MEDICATIONS:  Please refer to discharge medication list.  PROCEDURES PERFORMED: 1. Borderline heart size, patchy airspace, distinctive infiltrate and     density are seen in the right lower lobe and left perihilar     infiltrate. 2. Chest x-ray on April 01, 2010, showed new bilateral airspace     disease.  BRIEF ADMITTING HISTORY AND PHYSICAL:  This is a 45 year old femalerecently discharged from the hospital for alveolar hemorrhage or pulmonary infiltrate.  She was discharged on antibiotics.  She was supposed to follow up with her pulmonologist, but she relates that last day before coming into the hospital she was already getting short of breath and coughing up sputum.  There was no blood on it and she was complaining of fever and chills.  Entirely short of breath.  Also some chest pain with increased inspiration, so we are asked to admit her to further evaluate.  Please refer to dictation from April 04, 2010, for other details.  CONSULTANTS:  Cardiology and Pulmonology.  BRIEF HOSPITAL COURSE: 1. Acute respiratory failure, probably multifactorial secondary to     acute decompensated diastolic heart failure and healthcare-     associated pneumonia.  She was admitted to the floor.  She was     started on cefepime and Levaquin.  Diuresed.  She never required     intubation.  With treatment of her pneumonia and diuresing.   Her     shortness of breath got better. 2. Healthcare-associated pneumonia.  She was admitted to the floor.     Cultures were done which were negative.  She was started on     Levaquin and cefepime.  She defervesced.  Her white count came     down.  Her shortness of breath improved.  She was feeling much     better, able to tolerate diet, so she was changed to p.o. Levaquin     and sent home and discharged to follow up with Pulmonology as an     outpatient at this time.  Differential remains ANCA with     vasculitis, but this seems more likely that this was healthcare-     associated pneumonia.  Her rheumatoid factor was less than 10.  Her     cultures continue to be negative. 3. Iron deficiency anemia.  Her MCV is 71.  She was started on ferrous     sulfate.  She is a menstruating female, most likely the cause. 4. Acute on chronic decompensated diastolic heart failure.  Cardiology     was  consulted.  They started labetalol.  They agreed with     management.  She was diuresed well.  On the day of discharge, her     BNP was 76.  She will follow up with Cardiology as an outpatient.  VITAL SIGNS ON DAY OF DISCHARGE:  Temperature 99, pulse 93, respirations 18, and blood pressure 151/81.  She was saturating 98% on room air.  LABORATORY DATA ON DAY OF DISCHARGE:  Sodium 140, potassium 3.6, chloride 108, bicarb 26, glucose 109, BUN of 8, creatinine 0.5, and calcium 9.3.  Her BNP is 76.     [name], M.D.     AF/MEDQ  D:  04/07/2010  T:  04/08/2010  Job:  [id number]  cc:   [name], MD  Electronically Signed by [name] M.D. on 04/11/2010 08:10:49 AM

## 2010-04-12 NOTE — Telephone Encounter (Signed)
[email or web address].Corina Stacy, CMA  

## 2010-04-15 NOTE — Telephone Encounter (Signed)
Appointment set. [name], CMA

## 2010-04-16 ENCOUNTER — Encounter: Payer: Self-pay | Admitting: Cardiovascular Disease

## 2010-04-18 ENCOUNTER — Other Ambulatory Visit: Payer: Self-pay | Admitting: Internal Medicine

## 2010-04-18 ENCOUNTER — Ambulatory Visit (INDEPENDENT_AMBULATORY_CARE_PROVIDER_SITE_OTHER)
Admission: RE | Admit: 2010-04-18 | Discharge: 2010-04-18 | Disposition: A | Discharge status: Home / Self Care | Payer: Self-pay | Source: Ambulatory Visit | Attending: Internal Medicine | Admitting: Internal Medicine

## 2010-04-18 DIAGNOSIS — R0602 Shortness of breath: Secondary | ICD-10-CM

## 2010-04-22 NOTE — H&P (Signed)
[name], [name]                   ACCOUNT NO.:  [id number]  MEDICAL RECORD NO.:  [id number]           PATIENT TYPE:  E  LOCATION:  [facility]                         FACILITY:  [facility]  PHYSICIAN:  Eduard Clos, MDDATE OF BIRTH:  1965/06/22  DATE OF ADMISSION:  04/04/2010 DATE OF DISCHARGE:                             HISTORY & PHYSICAL   PRIMARY CARE PHYSICIAN:  Unassigned.  PRIMARY PULMONOLOGIST:  [name], MD  CHIEF COMPLAINT:  Cough with productive sputum and shortness of breath.  HISTORY OF PRESENT ILLNESS:  A 45 year old female who was just recently discharged from hospital on March 19, 2010, however, the patient has been having alveolar hemorrhage.  At that time, the patient was admitted under Pulmonology and patient chewed antibiotics.  Most of the workup at that time was negative including HIV, ANA, rheumatoid factor, C3, C4. 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  The patient was discharged with antibiotics and to follow with a pulmonologist.  The patient did fall and at that time the patient was told to get a cardiology opinion.  The patient states that last evening, she again became short of breath and coughing productive of sputum, and this time she had no blood in the sputum and she had fever, chills, weak, tired, short of breath, and also some chest pain which increases with deep inspiration.  She also has extensive shortness of breath.  The patient has been admitted for further workup at this time.  X-ray shows infiltrates bilaterally.  The patient denies any nausea, vomiting, abdominal pain, dysuria, discharge, diarrhea, any dizziness, loss of consciousness, any focal deficit.  PAST MEDICAL HISTORY:  Recent admission for alveolar hemorrhage and bilateral pulmonary infiltrates of unknown cause.  PAST SURGICAL HISTORY:  C-section.  MEDICATIONS ON ADMISSION:  None.  SOCIAL HISTORY:  The patient denies smoking cigarettes, drinking alcohol, or  use of illegal drugs.  Works as a Social worker.  FAMILY HISTORY:  Negative for diabetes or hypertension or stroke.  REVIEW OF SYSTEMS:  As per history of present illness, nothing else significant.  PHYSICAL EXAMINATION:  GENERAL:  The patient examined at bedside appears, mildly short of breath, is able to talk in complete sentences. VITAL SIGNS:  Blood pressure is 160/70, pulse is 114 per minute, temperature is 99.2, respirations 24 per minute, O2 sat is 94%.  HEENT: Anicteric.  No pallor.  No facial asymmetry.  Tongue is midline. CHEST:  Bilateral air entry present.  No rhonchi.  There is bilateral coarse crepitation. HEART:  S1 and S2 heard. ABDOMEN:  Soft, nontender.  Bowel sounds heard. CNS:  Alert, awake, and oriented to time, place, and person.  Moves upper and lower extremities 5/5. EXTREMITIES:  Peripheral pulses felt.  No edema.  LABORATORY DATA:  Chest x-ray shows new bilateral patchy airspace disease consistent with pneumonia.  CBC; WBC is 13.8, hemoglobin is 8.4 which is time she was discharged, hematocrit 29.1, platelets 366.  Complete metabolic panel, sodium 137, potassium 4.1, chloride 108, carbon dioxide 22, glucose 97, BUN 17, creatinine 0.6, total bilirubin is 0.5, alk phos 69, AST 54, ALT 77,  total protein 6.5, albumin 3, calcium 9.  UA is negative for nitrites and leukocyte.  ASSESSMENT: 1. Pneumonia. 2. Recent admission for alveolar hemorrhage and bilateral infiltrate. 3. 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 4. Anemia. 5. Sinus tachycardia. 6. Pleuritic-type of chest pain.  PLAN: 1. At this time, admit the patient to step-down unit for closer     observation at least for next 24 hours. 2. At this time, we will be starting empiric antibiotics for     pneumonia, I have already discussed with the pulmonologist, Dr.     Sung Amabile, who has advised to keep the patient n.p.o. for     anticipation of bronchoscopy. 3. Further  recommendation based on the clinical course and consult     recommendations.     Eduard Clos, MD     ANK/MEDQ  D:  04/04/2010  T:  04/04/2010  Job:  [id number]  Electronically Signed by Midge Minium MD on 04/22/2010 07:54:11 AM

## 2010-04-29 LAB — AFB CULTURE WITH SMEAR (NOT AT ARMC): Acid Fast Smear: NONE SEEN

## 2010-04-30 ENCOUNTER — Encounter: Payer: Self-pay | Admitting: Cardiovascular Disease

## 2010-04-30 ENCOUNTER — Ambulatory Visit (INDEPENDENT_AMBULATORY_CARE_PROVIDER_SITE_OTHER): Payer: Self-pay | Admitting: Cardiovascular Disease

## 2010-04-30 VITALS — BP 134/64 | HR 90 | Resp 18 | Ht 65.0 in | Wt 205.4 lb

## 2010-04-30 DIAGNOSIS — I5030 Unspecified diastolic (congestive) heart failure: Secondary | ICD-10-CM

## 2010-04-30 DIAGNOSIS — I10 Essential (primary) hypertension: Secondary | ICD-10-CM

## 2010-04-30 LAB — AFB CULTURE WITH SMEAR (NOT AT ARMC): Acid Fast Smear: NONE SEEN

## 2010-04-30 MED ORDER — AMLODIPINE BESYLATE 5 MG PO TABS: 5.0000 mg | ORAL_TABLET | Freq: Every day | ORAL | Status: DC

## 2010-04-30 MED ORDER — FUROSEMIDE 20 MG PO TABS: 20.0000 mg | ORAL_TABLET | Freq: Every day | ORAL | Status: DC

## 2010-04-30 MED ORDER — LABETALOL HCL 200 MG PO TABS: 200.0000 mg | ORAL_TABLET | Freq: Two times a day (BID) | ORAL | Status: DC

## 2010-04-30 MED ORDER — LISINOPRIL 5 MG PO TABS: 5.0000 mg | ORAL_TABLET | Freq: Every day | ORAL | Status: DC

## 2010-04-30 NOTE — Assessment & Plan Note (Signed)
Blood pressure is well controlled. No changes. 

## 2010-04-30 NOTE — Patient Instructions (Signed)
Your physician recommends that you schedule a follow-up appointment in: 1 year with Dr. McAlhany   

## 2010-04-30 NOTE — Progress Notes (Signed)
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 She was seen as a consultation in the hospital by Dr. Tonny Bollman. She has done well since discharge from the hospital. She has residual cough and mild dyspnea. Overall much improved. Occasional mild LE edema that resolves at night. NO chest pain. Her BP has been well controlled.   Past Medical History  Diagnosis Date  . Hypertension     DX SEVERAL DECADES YRS AGO- UNTRREATED UNTIL ADMISSION 02/2010  . Diastolic heart failure 02/2010    NEW ON ECHO 02/2010  . Hemoptysis 02/2010    DUE TO ALV HGE.Marland KitchenMarland KitchenANA NEGATIVE...DEEMED DUE TO DIASTOLIC CHF  . PPD positive     NEGATIVE DURING CHILDHOOD IN [facility]..1ST + TEST 1993 DURING IMMIGRATION GRREN CARD CLEARENCE.Marland KitchenCXR NORMAL 1993 PER HX..s/p INR Rx AT [facility].Marland KitchenREPEAT + 02/2010    Past Surgical History  Procedure Date  . Cesarean section     Current Outpatient Prescriptions  Medication Sig Dispense Refill  . amLODipine (NORVASC) 5 MG tablet Take 5 mg by mouth daily.        . furosemide (LASIX) 20 MG tablet Take 20 mg by mouth daily.        Marland Kitchen labetalol (NORMODYNE) 200 MG tablet Take 200 mg by mouth 2 (two) times daily.        Marland Kitchen levofloxacin (LEVAQUIN) 500 MG tablet Take 500 mg by mouth daily.        Marland Kitchen lisinopril (PRINIVIL,ZESTRIL) 5 MG tablet Take 5 mg by mouth daily.          Not on File  History   Social History  . Marital Status: Married    Spouse Name: N/A    Number of Children: N/A  . Years of Education: N/A   Occupational History  . Not on file.   Social History Main Topics  . Smoking status: Never Smoker   . Smokeless tobacco: Not on file  . Alcohol Use:   . Drug Use:   . Sexually Active:    Other Topics Concern  . Not on file   Social History Narrative   . No narrative on file    Family History  Problem Relation Age of Onset  . Hypertension Father     Review of Systems:  As stated in HPI and otherwise negative.   BP 134/64  Pulse 90  Resp 18  Ht 5\' 5"  (1.651 m)  Wt 205 lb 6.4 oz (93.169 kg)  BMI 34.18 kg/m2  Physical Examination: General: Well developed, well nourished, NAD HEENT: OP clear, mucus membranes moist SKIN: warm, dry. No rashes. Neuro: No focal deficits Musculoskeletal: Muscle strength 5/5 all ext Psychiatric: Mood and affect normal Neck: No JVD, no carotid bruits, no thyromegaly, no lymphadenopathy. Lungs:Clear bilaterally, no wheezes, rhonci, crackles Cardiovascular: Regular rate and rhythm. Systolic  Murmur. No gallops or rubs. Abdomen:Soft. Bowel sounds present. Non-tender.  Extremities: No lower extremity edema. Pulses are 2 + in the bilateral DP/PT.  Echo: 03/19/10:  moderate concentric left   ventricular hypertrophy with normal left ventricular ejection fraction   of 55-60%.  There were no regional wall motion abnormalities.  Doppler   parameters were consistent with grade 1 diastolic dysfunction.  There   was thickening of the mitral leaflets, but only trace to mild mitral   regurgitation.  No other significant valvular disease was noted.  There   was no pericardial effusion.    EKG:NSR, rate 90 bpm. Non-specific T wave flattening.

## 2010-04-30 NOTE — Assessment & Plan Note (Signed)
Volume status ok. Recent admissions with diastolic heart failure as component of respiratory issues. Blood pressure is well controlled. Recent echo with normal LV systolic function. No further cardiac workup at this time.

## 2010-05-27 ENCOUNTER — Encounter: Payer: Self-pay | Admitting: Internal Medicine

## 2010-05-27 ENCOUNTER — Ambulatory Visit (INDEPENDENT_AMBULATORY_CARE_PROVIDER_SITE_OTHER): Payer: Medicaid Other | Admitting: Internal Medicine

## 2010-05-27 DIAGNOSIS — I10 Essential (primary) hypertension: Secondary | ICD-10-CM

## 2010-05-27 DIAGNOSIS — T44905A Adverse effect of unspecified drugs primarily affecting the autonomic nervous system, initial encounter: Secondary | ICD-10-CM

## 2010-05-27 DIAGNOSIS — R05 Cough: Secondary | ICD-10-CM

## 2010-05-27 DIAGNOSIS — T464X5A Adverse effect of angiotensin-converting-enzyme inhibitors, initial encounter: Secondary | ICD-10-CM

## 2010-05-27 DIAGNOSIS — R058 Other specified cough: Secondary | ICD-10-CM

## 2010-05-27 DIAGNOSIS — R059 Cough, unspecified: Secondary | ICD-10-CM

## 2010-05-27 HISTORY — DX: Other specified cough: R05.8

## 2010-05-27 HISTORY — DX: Adverse effect of angiotensin-converting-enzyme inhibitors, initial encounter: T46.4X5A

## 2010-05-27 MED ORDER — OLMESARTAN MEDOXOMIL 20 MG PO TABS: 20.0000 mg | ORAL_TABLET | Freq: Every day | ORAL | Status: DC

## 2010-05-27 NOTE — Assessment & Plan Note (Signed)
Reports poor control  Plan Change 5mg  lisinopril to 20mg  benicar samples Continue lasix, norvasc and labetalol Extensively discussed DASH diet priinciples and instructed; gave [facility] diet sheet

## 2010-05-27 NOTE — Assessment & Plan Note (Signed)
New diagnosis on 05/27/2010  Plan Dc lisinopril 5mg  STart 20mg  sample benicar daily (reports sbp 140 at home) ROV 5 weeks

## 2010-05-27 NOTE — Progress Notes (Signed)
  Subjective:    Patient ID: [name], female    DOB: 1965/02/05, 45 y.o.   MRN: [id number]  HPI Followup alveolar hemorrhage feb 2012 due to diastolic chf due to uncntrolled hypertension. After I saw her as fu in march 2012, she was readmitted for chf and bp and hcap. She states she was discharged on multiple anti-bp meds including lisinopril 5mg  per day. Since then has chronic dry cough that is severe at night. Stable since onset. No specific aggravating or relieving factors. No associated sinus, wheeze, gerd, fever, chills.    Review of Systems  Constitutional: Negative for fever and unexpected weight change.  HENT: Positive for nosebleeds and sneezing. Negative for ear pain, congestion, sore throat, rhinorrhea, trouble swallowing, dental problem, postnasal drip and sinus pressure.   Eyes: Positive for itching. Negative for redness.  Respiratory: Positive for cough and shortness of breath. Negative for chest tightness and wheezing.   Cardiovascular: Positive for palpitations. Negative for leg swelling.  Gastrointestinal: Positive for nausea. Negative for vomiting.  Genitourinary: Negative for dysuria.  Musculoskeletal: Negative for joint swelling.  Skin: Negative for rash.  Neurological: Negative for headaches.  Hematological: Does not bruise/bleed easily.  Psychiatric/Behavioral: Negative for dysphoric mood. The patient is not nervous/anxious.        Objective:   Physical Exam  [vitalsreviewed. Constitutional: She is oriented to person, place, and time. She appears well-developed and well-nourished. No distress.       obese  HENT:  Head: Normocephalic and atraumatic.  Right Ear: External ear normal.  Left Ear: External ear normal.  Mouth/Throat: Oropharynx is clear and moist. No oropharyngeal exudate.  Eyes: Conjunctivae and EOM are normal. Pupils are equal, round, and reactive to light. Right eye exhibits no discharge. Left eye exhibits no discharge. No scleral icterus.    Neck: Normal range of motion. Neck supple. No JVD present. No tracheal deviation present. No thyromegaly present.  Cardiovascular: Normal rate, regular rhythm, normal heart sounds and intact distal pulses.  Exam reveals no gallop and no friction rub.   No murmur heard. Pulmonary/Chest: Effort normal and breath sounds normal. No respiratory distress. She has no wheezes. She has no rales. She exhibits no tenderness.  Abdominal: Soft. Bowel sounds are normal. She exhibits no distension and no mass. There is no tenderness. There is no rebound and no guarding.  Musculoskeletal: Normal range of motion. She exhibits no edema and no tenderness.  Lymphadenopathy:    She has no cervical adenopathy.  Neurological: She is alert and oriented to person, place, and time. She has normal reflexes. No cranial nerve deficit. She exhibits normal muscle tone. Coordination normal.  Skin: Skin is warm and dry. No rash noted. She is not diaphoretic. No erythema. No pallor.  Psychiatric: She has a normal mood and affect. Her behavior is normal. Judgment and thought content normal.          Assessment & Plan:

## 2010-05-27 NOTE — Patient Instructions (Addendum)
#  FOR BP and COugh Please take diet sheet and work on changing your lifestyle; eat foods on the left column only Please do aerobic exercises 3 days per week atleast for 20-67minutes See if you can get a fitness trainer Losing 20# over next several years will be immensely beneficia Stop lisinopril due to cough STart benicar sample 20mg  per day Return in 4 weeks to check cough and bp

## 2010-06-07 NOTE — Group Therapy Note (Signed)
[name], [name] NO.:  [id number]   MEDICAL RECORD NO.:  [id number]          PATIENT TYPE:  WOC   LOCATION:  [facility]                   FACILITY:  [facility]   PHYSICIAN:  Tinnie Gens, MD        DATE OF BIRTH:  [birth year]   DATE OF SERVICE:  05/08/2005                                    CLINIC NOTE   CHIEF COMPLAINT:  Yearly examination.   HISTORY OF PRESENT ILLNESS:  The patient is a 45 year old gravida 6, para 4-  0-4-1-1-4 with 1 twin gestation and 1 term IU FD, who presents for a years  GYN examination. Her past pap smear was February 2006 and was normal. She  has not had a mammogram in 3 or 4 years. The patient complains today of some  bilateral breast tenderness that comes and goes. Is not related to her  cycles. She has noted no nipple discharge or breast masses. She continues to  have regular cycles. She has no change in blood flow or significant  menorrhea.   PAST MEDICAL HISTORY:  Negative.   PAST SURGICAL HISTORY:  She had a cesarean section and tubal ligation.   MEDICATIONS:  None.   ALLERGIES:  NO KNOWN DRUG ALLERGIES.   OBSTETRIC HISTORY:  She had a term IU FD. She had a cesarean section for  twins and BTL. One miscarriage. She had gestational diabetes class A-2 and a  history of hypertension during her last pregnancy.   GYNECOLOGIC HISTORY:  No history of abnormal pap. Regular cycles.   SOCIAL HISTORY:  She is employed full time at a [employer] service [employer]. No tobacco,  alcohol, or drug use.   REASON FOR ADMISSION:  14 point review of systems reviewed and essentially  negative except as in the HPI.   PHYSICAL EXAMINATION:  VITAL SIGNS:  Temperature 97.9, blood pressure  124/84, weight 216. Pulse was 79.  GENERAL:  A well developed, well nourished, white female in no acute  distress.  NECK:  Supple with normal thyroid.  BREAST:  Symmetric with even nipples. No masses. No supraclavicular axillary  adenopathy.  ABDOMEN:  Soft, nontender, and  nondistended.  GENITOURINARY:  Normal external female genitalia. BUS is normal. Vagina is  pink and rugae. Cervix is parous without lesion. Uterus is 6 to 8 weeks  size. Adnexa without mass or tenderness.  EXTREMITIES:  No clubbing, cyanosis, or edema.   IMPRESSION:  Yearly examination.   PLAN:  1.  Mammogram.  2.  Pap smear today.  3.  If her next pap smear is normal, patient can get every 3 year annual      examinations.  4.  To continue yearly mammograms.  5.  Trial using __________ oil for her breast tenderness.           ______________________________  Tinnie Gens, MD     TP/MEDQ  D:  05/08/2005  T:  05/09/2005  Job:  [id number]

## 2010-06-07 NOTE — Group Therapy Note (Signed)
NAME:  [name], [name] NO.:  [id number]   MEDICAL RECORD NO.:  [id number]          PATIENT TYPE:  WOC   LOCATION:  [facility]                   FACILITY:  [facility]   PHYSICIAN:  Argentina Donovan, MD        DATE OF BIRTH:  Mar 24, 1965   DATE OF SERVICE:                                  CLINIC NOTE   The patient is a 45 year old [place] female from the [place],  gravida 6, para 52-0-4,1,1,4 with a set of twins 46 years old and 3  daughters older than that.  She has been in good health and has not had  any problems of the past year.  The breast pain that she was complaining  about 1 year ago has abated, and the patient is in for an annual check.  Dr. Shawnie Pons told her she only needed a Pap smear every 3 years.  Her 1  last year was normal.   EXAMINATION:  BREASTS:  Symmetrical with no dominant masses.  No nipple  discharge.  ABDOMEN:  Soft, flat and nontender.  No masses or organomegaly.  External genitalia is normal.  BUS within normal limits.  Vagina is  clean, well rugous.  This cervix is parous and clean the uterus is  normal size for some of this who has been multiparas with a regular  easily mobile free cul-de-sac and the adnexa are normal.   IMPRESSION:  Normal gynecological examination.           ______________________________  Argentina Donovan, MD     PR/MEDQ  D:  05/13/2006  T:  05/13/2006  Job:  [id number]

## 2010-06-07 NOTE — Op Note (Signed)
NAME:  [name], [name]                             ACCOUNT NO.:  [id number]   MEDICAL RECORD NO.:  [id number]                   PATIENT TYPE:  INP   LOCATION:  9142                                 [facility]   PHYSICIAN:  Mary Sella. Orlene Erm, M.D.                 DATE OF BIRTH:  [birth year]   DATE OF PROCEDURE:  12/04/2001  DATE OF DISCHARGE:                                 OPERATIVE REPORT   PREOPERATIVE DIAGNOSIS:  The patient is a  45 year old black female, gravida  6, para 4-0-1-3 at 35 weeks and six days.  She has pregnancy with twin  intrauterine pregnancy and unstable lie.   POSTOPERATIVE DIAGNOSIS:  The patient is a  45 year old black female,  gravida 6, para 4-0-1-3 at 35 weeks and six days.  She has pregnancy with  twin intrauterine pregnancy and unstable lie.   PROCEDURE:  Primary low transverse cesarean section and bilateral tubal  ligation.   SURGEON:  Mary Sella. Orlene Erm, M.D.   ASSISTANT:  Douglass Rivers, M.D.   ANESTHESIA:  Epidural.   COMPLICATIONS:  None.   SPECIMENS:  Cord blood and placenta.   ESTIMATED BLOOD LOSS:  1000 cc.   DISPOSITION:  To recovery room stable.   INDICATIONS FOR PROCEDURE:  The patient is a 45 year old black female who  presents with a twin intrauterine pregnancy and spontaneous rupture of  membranes at approximately 2 a.m.  The patient was found to be cephalic by  ultrasound earlier in the day, was possibly 1 cm dilated and 50% effaced.  The patient underwent Pitocin augmentation and placement of scalp lead.  The  patient progressed to 3 cm and at that time the patient was found to be in  noncephalic presentation.  The patient underwent ultrasound again and was  found to have breech presenting twin A.  The patient was consented for  primary low transverse cesarean section, and bilateral tubal ligation.   PROCEDURE:  The patient was taken to the operating room and general  anesthesia was accomplished.  She was prepped and draped in sterile  fashion.  A Pfannenstiel incision was performed and carried to the underlying fascia  with scalpel.  The fascia was incised and dissected laterally with Mayo  scissors.  The fascia was separated from the underlying rectus muscle and  ______with sharp and blunt dissection.  The rectus musculare were separated  and the peritoneum was entered sharply.  The peritoneum was extended  superiorly and inferiorly.  A bladder blade was placed and bladder flap was  carried out with sharp and blunt dissection.  The uterus was scored with the  scalpel and carried down to the midline to the uterine cavity.  The uterine  cavity was entered and the incision was extended with the surgeons fingers.  Upon entering the uterine cavity an infant was found to be in transverse lie  with back  down.  The infant's head was righted and delivered up through the  uterine incision.  The infant's body was delivered and the cord was clamped  and cut.  The scalp lead had actually been on the shoulders of the  presenting infant.  The second infant was found to be in vertex presentation  and delivered without difficulty through the operative incision.  The cord  was clamped and the infant was handed off  again to the awaiting neonatal  resuscitation team.  The placenta was then manually extracted.  The uterus  was curetted with a dry lap sponge.  The uterus was externalized.   There was an extension of the uterine incision to the left uterine arteries,  and there was some bleeding from this region.  A running locked stitch of 0  chromic was used to obtain hemostasis.  Several figure-of-eight sutures were  applied to obtain further hemostasis.  There was still some oozing of the  left corner of the uterine incision, and a uterine artery ligation was  performed with 0 chromic.  The uterus was hemostatic.  We returned to the  abdominal cavity.  The uterus was hemostatic.   Attention was then turned to the patient's fallopian  tubes, where the  Babcock clamp was used to grasp the patient's left fallopian tube.  A 1 cm  segment of tube was ligated with several ties of 0 plain gut.  The 1 cm  segment tie was excised.  The patient's right fallopian was identified and  doubly ligated in a similar fashion.  Again, a 1 cm segment of tube was  excised.  The uterus was returned to the abdominal cavity and the pericolic  gutters were emptied of clot and debris.  The uterine incision and the tubal  end sites were inspected and noted to be hemostatic.  The fascia was then  closed with a running stitch of 0 Vicryl.  The subcutaneous tissue was  irrigated with normal saline and the skin edges were reapproximated with  staples.  The patient tolerated the procedure well and returned to the  recovery room in stable condition.                                               Mary Sella. Orlene Erm, M.D.    [facility]/MEDQ  D:  12/04/2001  T:  12/04/2001  Job:  [id number]

## 2010-06-07 NOTE — Discharge Summary (Signed)
   NAME:  [name], [name]                             ACCOUNT NO.:  [id number]   MEDICAL RECORD NO.:  [id number]                   PATIENT TYPE:  INP   LOCATION:  9304                                 [facility]   PHYSICIAN:  Tamika J. Lazarus Salines, M.D.                DATE OF BIRTH:  1965-09-21   DATE OF ADMISSION:  12/09/2001  DATE OF DISCHARGE:  12/11/2001                                 DISCHARGE SUMMARY   DISCHARGE DIAGNOSES:  1. Postoperative/postpartum from lower transverse cesarean section on     12/04/01.  2. Preeclampsia.   DISCHARGE MEDICATIONS:  Hydrochlorothiazide 25 mg daily.   FOLLOW UP:  [facility] in 5-6 weeks.   HISTORY OF PRESENT ILLNESS:  Please see full H&P for details.  In brief,  patient is a 45 year old gravida 6, para 4-2-1-5 who presented to [facility] on  post-op day #5 status post low transverse C-section for staple removal.  She  was noted to have elevated blood pressure ________ at hospitalization to 162  systolic and 103 diastolic.  Patient had been treated for preeclampsia  during her last hospitalization so PIH labs were drawn and were concerning  for some elevation.   LABORATORY DATA:  Included BUN 15, creatinine 0.9.  AST 45, ALT 35.  LDH  339.  Uric acid 4.0.  Platelets 304.   HOSPITAL COURSE:  The patient was admitted to AICU and started on magnesium  sulfate for treatment of suspected preeclampsia.  Patient did well during  hospitalization.  She denied any headache, visual changes or right upper  quadrant pain.  She diuresed well on magnesium sulfate which was  discontinued on the second day of hospitalization.  PIH labs were rechecked  and remained stable.  Her blood pressure remained elevated and this was  presumed to be due to chronic hypertension.  Hydrochlorothiazide was  started.   DISCHARGE INSTRUCTIONS:  The patient was discharged to home in stable  condition.                                                Tamika J. Lazarus Salines, M.D.    Nadara Eaton  D:  05/27/2002  T:  05/28/2002  Job:  [id number]

## 2010-06-07 NOTE — Discharge Summary (Signed)
NAME:  [name], [name]                             ACCOUNT NO.:  [id number]   MEDICAL RECORD NO.:  [id number]                   PATIENT TYPE:  INP   LOCATION:  9109                                 FACILITY:  [facility]   PHYSICIAN:  Tamika J. Lazarus Salines, M.D.                DATE OF BIRTH:  1965-06-04   DATE OF ADMISSION:  12/04/2001  DATE OF DISCHARGE:  12/07/2001                                 DISCHARGE SUMMARY   ATTENDING PHYSICIAN:  Dr. Conni Elliot.   ADMISSION DIAGNOSES:  1. Intrauterine pregnancy at 35 weeks 6 days.  2. Preterm premature rupture of membranes.  3. Multiple gestations.  4. Gestational diabetes.  5. Advanced maternal age.   DISCHARGE DIAGNOSES:  1. Status post low transverse cesarean section for unstable lie.  2. Delivery of viable twin female infants.  3. Status post bilateral tubal ligation.  4. Anemia.  5. Pregnancy induced hypertension.   DISCHARGE MEDICATIONS:  1. Ibuprofen 600 mg q.6h. p.r.n.  2. Percocet 5/325 mg q.4h. p.r.n. severe pain.  3. Prenatal vitamins daily.  4. Ferrous sulfate 325 mg daily.   HISTORY OF PRESENT ILLNESS:  Please see full H&P for details.  In brief, a  45 year old [place] American female presented as a gravida 6, para 4-0-1-3  at 22 and 6 weeks with a twin intrauterine pregnancy and spontaneous rupture  of membranes.  Patient had been receiving her prenatal care at the high risk  clinic.  Her prenatal course was complicated by twin gestation, gestational  diabetes, advanced maternal age, history of large for gestational age  fetuses and history of prior intrauterine fetal demise.  Patient was  admitted to labor and delivery for expectant management.   HOSPITAL COURSE:  Problem 1.  On initial evaluation patient was found to be  cephalic by ultrasound and dilated 1 cm.  She underwent Pitocin augmentation  and she progressed to 3 cm.  On repeat ultrasound assessment the patient was  found to be in a non-cephalic breech presentation.   Patient was consented  for primary low transverse cesarean section as well as bilateral tubal  ligation.  She delivered viable twin female infants without complication.  She  did well postoperatively.   Problem 2.  Her postpartum course was complicated elevated blood pressures  following delivery and increased creatinine of 0.9.  She was started on  treatment with magnesium sulfate for possible preeclampsia.  She maintained  good urine output and had no other signs or symptoms of preeclampsia so, the  magnesium was discontinued after one day.  She continued to do well  following this.   Problem 3.  She was started on ferrous sulfate for treatment of anemia.  She  breast fed both infants.   DISPOSITION:  On day of discharge she had no complaints and was ambulating  and eating well.  She was discharged to home in stable  condition.   DISCHARGE INSTRUCTIONS:  1. Patient was instructed to return to maternity admissions in two days for     staple removal.  2. Counseled on and advised to maintain and ADA diet.  3. Instructed to return to [facility] for followup visit in six weeks.                                               Tamika J. Lazarus Salines, M.D.    Nadara Eaton  D:  05/27/2002  T:  05/28/2002  Job:  [id number]

## 2010-06-25 ENCOUNTER — Ambulatory Visit: Payer: Medicaid Other | Admitting: Internal Medicine

## 2010-08-28 ENCOUNTER — Encounter: Payer: Self-pay | Admitting: Obstetrics and Gynecology

## 2010-08-28 ENCOUNTER — Ambulatory Visit (INDEPENDENT_AMBULATORY_CARE_PROVIDER_SITE_OTHER): Payer: Medicaid Other | Admitting: Obstetrics and Gynecology

## 2010-08-28 ENCOUNTER — Other Ambulatory Visit (HOSPITAL_COMMUNITY): Admission: RE | Admit: 2010-08-28 | Payer: Self-pay | Source: Ambulatory Visit | Admitting: Obstetrics and Gynecology

## 2010-08-28 VITALS — BP 152/85 | HR 105 | Temp 97.6°F | Ht 65.0 in | Wt 204.2 lb

## 2010-08-28 DIAGNOSIS — Z1231 Encounter for screening mammogram for malignant neoplasm of breast: Secondary | ICD-10-CM

## 2010-08-28 DIAGNOSIS — Z0000 Encounter for general adult medical examination without abnormal findings: Secondary | ICD-10-CM

## 2010-08-28 DIAGNOSIS — Z124 Encounter for screening for malignant neoplasm of cervix: Secondary | ICD-10-CM

## 2010-08-28 NOTE — Progress Notes (Signed)
Patient is a 45 year old nulligravida female from [place]. She is in for her annual GYN exam. She's never had an abnormal Pap smear and has had almost every year. We will order a mammogram and do a Pap smear today.  She is a well-developed slightly obese black female in no acute distress. Her blood pressure is 143 will over 85 and she takes labetalol and Lasix daily. Her weight is 228 pounds and her height is 5 foot 5 inches. Exam: Breasts: Symmetrical large pendulous. No dominant masses, no nipple discharge, no supraclavicular nor axillary nodes. Abdomen: Flat soft nontender no masses no organomegaly. Genitalia: External normal BUS within normal limits. Vagina clean and well rugated. Cervix clean and nulliparous Pap smear was taken. The uterus is anterior of normal size shape and consistency. The adnexa could not be palpated because of the habitus of the patient.  Impression is normal gynecological examination.

## 2010-09-03 ENCOUNTER — Ambulatory Visit (HOSPITAL_COMMUNITY)
Admission: RE | Admit: 2010-09-03 | Discharge: 2010-09-03 | Disposition: A | Discharge status: Home / Self Care | Payer: Self-pay | Source: Ambulatory Visit | Attending: Obstetrics and Gynecology | Admitting: Obstetrics and Gynecology

## 2010-09-03 DIAGNOSIS — Z1231 Encounter for screening mammogram for malignant neoplasm of breast: Secondary | ICD-10-CM | POA: Insufficient documentation

## 2010-09-06 ENCOUNTER — Emergency Department (HOSPITAL_COMMUNITY): Payer: Medicaid Other

## 2010-09-06 ENCOUNTER — Inpatient Hospital Stay (HOSPITAL_COMMUNITY)
Admission: EM | Admit: 2010-09-06 | Discharge: 2010-09-10 | DRG: 194 | Disposition: A | Discharge status: Home / Self Care | Payer: Medicaid Other | Attending: Internal Medicine | Admitting: Internal Medicine

## 2010-09-06 ENCOUNTER — Inpatient Hospital Stay (HOSPITAL_COMMUNITY): Payer: Medicaid Other

## 2010-09-06 ENCOUNTER — Encounter (HOSPITAL_COMMUNITY): Payer: Self-pay | Admitting: Radiology

## 2010-09-06 DIAGNOSIS — I509 Heart failure, unspecified: Secondary | ICD-10-CM | POA: Diagnosis present

## 2010-09-06 DIAGNOSIS — I10 Essential (primary) hypertension: Secondary | ICD-10-CM | POA: Diagnosis present

## 2010-09-06 DIAGNOSIS — M25569 Pain in unspecified knee: Secondary | ICD-10-CM | POA: Diagnosis present

## 2010-09-06 DIAGNOSIS — R040 Epistaxis: Secondary | ICD-10-CM | POA: Diagnosis present

## 2010-09-06 DIAGNOSIS — I5030 Unspecified diastolic (congestive) heart failure: Secondary | ICD-10-CM | POA: Diagnosis present

## 2010-09-06 DIAGNOSIS — J189 Pneumonia, unspecified organism: Principal | ICD-10-CM | POA: Diagnosis present

## 2010-09-06 DIAGNOSIS — I498 Other specified cardiac arrhythmias: Secondary | ICD-10-CM | POA: Diagnosis present

## 2010-09-06 DIAGNOSIS — R1032 Left lower quadrant pain: Secondary | ICD-10-CM | POA: Diagnosis present

## 2010-09-06 DIAGNOSIS — D509 Iron deficiency anemia, unspecified: Secondary | ICD-10-CM | POA: Diagnosis present

## 2010-09-06 LAB — DIFFERENTIAL
Basophils Absolute: 0 10*3/uL (ref 0.0–0.1)
Basophils Relative: 0 % (ref 0–1)
Eosinophils Absolute: 0.1 10*3/uL (ref 0.0–0.7)
Eosinophils Relative: 1 % (ref 0–5)
Lymphs Abs: 1.8 10*3/uL (ref 0.7–4.0)
Neutrophils Relative %: 76 % (ref 43–77)

## 2010-09-06 LAB — BASIC METABOLIC PANEL
CO2: 27 mEq/L (ref 19–32)
Calcium: 9.7 mg/dL (ref 8.4–10.5)
Creatinine, Ser: <0.47 mg/dL — ABNORMAL LOW (ref 0.50–1.10)
Sodium: 136 mEq/L (ref 135–145)

## 2010-09-06 LAB — CBC
Platelets: 252 10*3/uL (ref 150–400)
RBC: 4.32 MIL/uL (ref 3.87–5.11)
RDW: 14.6 % (ref 11.5–15.5)
WBC: 12.2 10*3/uL — ABNORMAL HIGH (ref 4.0–10.5)

## 2010-09-06 LAB — POCT I-STAT, CHEM 8
BUN: 14 mg/dL (ref 6–23)
Chloride: 104 mEq/L (ref 96–112)
HCT: 35 % — ABNORMAL LOW (ref 36.0–46.0)
Sodium: 139 mEq/L (ref 135–145)
TCO2: 26 mmol/L (ref 0–100)

## 2010-09-06 LAB — PROCALCITONIN: Procalcitonin: <0.1 ng/mL

## 2010-09-06 LAB — D-DIMER, QUANTITATIVE: D-Dimer, Quant: 0.72 ug/mL-FEU — ABNORMAL HIGH (ref 0.00–0.48)

## 2010-09-06 LAB — LACTIC ACID, PLASMA: Lactic Acid, Venous: 1.7 mmol/L (ref 0.5–2.2)

## 2010-09-06 MED ORDER — IOHEXOL 350 MG/ML SOLN
80.0000 mL | Freq: Once | INTRAVENOUS | Status: AC | PRN
  Administered 2010-09-06: 80 mL via INTRAVENOUS

## 2010-09-06 MED ORDER — IOHEXOL 300 MG/ML  SOLN
100.0000 mL | Freq: Once | INTRAMUSCULAR | Status: AC | PRN
  Administered 2010-09-06: 100 mL via INTRAVENOUS

## 2010-09-07 ENCOUNTER — Inpatient Hospital Stay (HOSPITAL_COMMUNITY): Payer: Medicaid Other

## 2010-09-07 DIAGNOSIS — R042 Hemoptysis: Secondary | ICD-10-CM

## 2010-09-07 LAB — COMPREHENSIVE METABOLIC PANEL
AST: 28 U/L (ref 0–37)
Albumin: 2.7 g/dL — ABNORMAL LOW (ref 3.5–5.2)
Calcium: 9.4 mg/dL (ref 8.4–10.5)
Creatinine, Ser: <0.47 mg/dL — ABNORMAL LOW (ref 0.50–1.10)

## 2010-09-07 LAB — HEMOGLOBIN AND HEMATOCRIT, BLOOD
HCT: 28.8 % — ABNORMAL LOW (ref 36.0–46.0)
Hemoglobin: 9.1 g/dL — ABNORMAL LOW (ref 12.0–15.0)

## 2010-09-07 LAB — CBC
MCH: 23.8 pg — ABNORMAL LOW (ref 26.0–34.0)
MCV: 77 fL — ABNORMAL LOW (ref 78.0–100.0)
Platelets: 203 10*3/uL (ref 150–400)
RDW: 14.8 % (ref 11.5–15.5)

## 2010-09-07 LAB — PHOSPHORUS: Phosphorus: 4.2 mg/dL (ref 2.3–4.6)

## 2010-09-07 LAB — MAGNESIUM: Magnesium: 1.8 mg/dL (ref 1.5–2.5)

## 2010-09-07 LAB — PRO B NATRIURETIC PEPTIDE: Pro B Natriuretic peptide (BNP): 446.4 pg/mL — ABNORMAL HIGH (ref 0–125)

## 2010-09-08 ENCOUNTER — Inpatient Hospital Stay (HOSPITAL_COMMUNITY): Payer: Medicaid Other

## 2010-09-08 DIAGNOSIS — R011 Cardiac murmur, unspecified: Secondary | ICD-10-CM

## 2010-09-08 LAB — HEMOGLOBIN AND HEMATOCRIT, BLOOD
HCT: 29.6 % — ABNORMAL LOW (ref 36.0–46.0)
HCT: 30.9 % — ABNORMAL LOW (ref 36.0–46.0)
Hemoglobin: 9.7 g/dL — ABNORMAL LOW (ref 12.0–15.0)

## 2010-09-08 LAB — URINALYSIS, ROUTINE W REFLEX MICROSCOPIC
Bilirubin Urine: NEGATIVE
Leukocytes, UA: NEGATIVE
Nitrite: NEGATIVE
Specific Gravity, Urine: 1.014 (ref 1.005–1.030)
Urobilinogen, UA: 1 mg/dL (ref 0.0–1.0)
pH: 7.5 (ref 5.0–8.0)

## 2010-09-08 NOTE — Consult Note (Signed)
[name], [name]                   ACCOUNT NO.:  [id number]  MEDICAL RECORD NO.:  [id number]  LOCATION:  3714                         FACILITY:  [facility]  PHYSICIAN:  Clinton D. Maple Hudson, MD, FCCP, FACPDATE OF BIRTH:  1965/03/27  DATE OF CONSULTATION:  09/07/2010 DATE OF DISCHARGE:                                CONSULTATION   PROBLEMS FOR CONSULTATION:  A 45 year old woman evaluated for recurrent hemoptysis.  She is a nonsmoker who came to this country from the [place] in [place] 22 years ago and last visited there 5 years ago. First episode of hemoptysis attributed to alveolar hemorrhage in February 2012 with the second episode in March.  The third episode for the current admission began on the night before admission and woke her from sleep.  She has had intermittent mild and usually dry cough over the last several months at least.  Cough is associated with some substernal soreness, some shortness of breath especially with supine position, and with exertion.  She has not had pain on swallowing. 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  Urine drug screen, HIV have been negative.  B natriuretic peptide previously was 183.  PAST MEDICAL HISTORY: 1. Hypertension. 2. Two previous episodes of alveolar hemorrhage. 3. Treatment for hospital-acquired pneumonia. 4. Iron-deficiency anemia without non pulmonary blood loss known. 5. Diastolic congestive heart failure.  ALLERGIES:  None known.  SOCIAL HISTORY:  Working as a Social worker.  Five children.  Denies alcohol or street drug use.  FAMILY HISTORY:  Negative for any bleeding disorder and negative for diabetes, hypertension, and stroke.  PAST SURGICAL HISTORY:  Limited to C-section.  REVIEW OF SYSTEMS:  May be low-grade fever and chilling at times, but not recently according to her now.  Chest discomfort only as described. She denies headache, blurred vision, rash, adenopathy, myalgias,  weight loss.  Review of systems is otherwise negative.  OBJECTIVE:  VITAL SIGNS:  BP 160/77, heart rate 113, respirations 15, temperature 100.3, oxygen saturation 100% on 2 L prongs. GENERAL:  This is an alert, slightly accented, pleasant, and apparently comfortable woman lying supine.  She is cooperative and attentive. SKIN:  No evident rash, bruising, or excoriation. ADENOPATHY:  None found at the cervical, supraclavicular, or axillary areas. NEUROLOGIC:  Speech is clear.  Tongue protrudes in midline.  Extraocular muscles move normally.  She moves all extremities normally. HEENT:  Conjunctivae are clear.  Oral mucosa seems clear.  Voice quality is normal.  Hearing and vision are grossly intact. NECK:  There is no neck vein distention, thyromegaly, or stridor. CHEST:  Quiet breathing without rales, rhonchi, or wheeze heard and with no cough. HEART:  Rapid regular rhythm.  There is a complex murmur.  I am hearing a diastolic component and a short high pitched systolic component, best heard near the sternum.  PMI does not appear increased.  I cannot exclude a soft rub. ABDOMEN:  Soft and nontender.  I do not feel liver or spleen.  I do not hear a bruit. EXTREMITIES:  Without cyanosis, clubbing, or edema.  CHEST X-RAY:  Patchy density consistent with a bronchopneumonia.  CT angiogram  of chest negative for pulmonary embolism, mild cardiac enlargement, multifocal patchy ground glass densities, which had been noticed previously and noted to resolve in the interim with some air bronchograms consistent with fluid density such as hemorrhage.  X-ray of right knee for knee pain was negative.  LABORATORY DATA:  Pro BNP 446.  HIV negative.  D-dimer is 0.72.  CBC significant for WBC 7200, hemoglobin 8.9, platelets 203,000.  BUN 9, creatinine 0.47.  Liver functions normal except ALT 37 and albumin 2.7.  IMPRESSION:  Recurrent alveolar hemorrhage.  She has now been started on broad-spectrum  antibiotics for possible community-associated pneumonia with Zithromax and Rocephin.  Intermittent hemoptysis in this context is rather famously attributed to parasite infestation such as Strongyloides and those should be considered. Serology for Strongyloides and stool for ova  and parasites may be revealing  I suspect possible connection to her heart murmur and recommend an echocardiogram on that basis.  Consider imaging the abdomen for asymptomatic involvement of liver, spleen, and kidney, which might suggest a broader process as this is a recurrent disorder now, if other workup is negative, we will consider surgical open lung biopsy.     Clinton D. Maple Hudson, MD, FCCP, FACP     CDY/MEDQ  D:  09/07/2010  T:  09/07/2010  Job:  [id number]  cc:   Hind Bosie Helper, MD Fleet Contras, M.D.  Electronically Signed by Jetty Duhamel MD FCCP FACP on 09/08/2010 91:47:82 PM

## 2010-09-09 ENCOUNTER — Inpatient Hospital Stay (HOSPITAL_COMMUNITY): Payer: Medicaid Other

## 2010-09-09 DIAGNOSIS — R042 Hemoptysis: Secondary | ICD-10-CM

## 2010-09-09 LAB — HEMOGLOBIN AND HEMATOCRIT, BLOOD: Hemoglobin: 9.7 g/dL — ABNORMAL LOW (ref 12.0–15.0)

## 2010-09-09 MED ORDER — IOHEXOL 300 MG/ML  SOLN
100.0000 mL | Freq: Once | INTRAMUSCULAR | Status: AC | PRN
  Administered 2010-09-09: 100 mL via INTRAVENOUS

## 2010-09-10 LAB — PRO B NATRIURETIC PEPTIDE: Pro B Natriuretic peptide (BNP): 141.8 pg/mL — ABNORMAL HIGH (ref 0–125)

## 2010-09-10 LAB — DIFFERENTIAL
Basophils Absolute: 0 10*3/uL (ref 0.0–0.1)
Basophils Relative: 0 % (ref 0–1)
Eosinophils Absolute: 0.5 10*3/uL (ref 0.0–0.7)
Eosinophils Relative: 8 % — ABNORMAL HIGH (ref 0–5)
Monocytes Absolute: 0.8 10*3/uL (ref 0.1–1.0)
Monocytes Relative: 13 % — ABNORMAL HIGH (ref 3–12)

## 2010-09-10 LAB — BASIC METABOLIC PANEL
CO2: 25 mEq/L (ref 19–32)
Calcium: 10.1 mg/dL (ref 8.4–10.5)
Creatinine, Ser: <0.47 mg/dL — ABNORMAL LOW (ref 0.50–1.10)
Sodium: 141 mEq/L (ref 135–145)

## 2010-09-10 LAB — CBC
MCH: 24.1 pg — ABNORMAL LOW (ref 26.0–34.0)
MCHC: 31.7 g/dL (ref 30.0–36.0)
RDW: 14.1 % (ref 11.5–15.5)

## 2010-09-10 LAB — TSH: TSH: 0.013 u[IU]/mL — ABNORMAL LOW (ref 0.350–4.500)

## 2010-09-12 LAB — CULTURE, BLOOD (ROUTINE X 2)
Culture  Setup Time: 201208171406
Culture: NO GROWTH

## 2010-09-20 ENCOUNTER — Ambulatory Visit (INDEPENDENT_AMBULATORY_CARE_PROVIDER_SITE_OTHER): Payer: Self-pay | Admitting: Internal Medicine

## 2010-09-20 ENCOUNTER — Encounter: Payer: Self-pay | Admitting: Internal Medicine

## 2010-09-20 DIAGNOSIS — I10 Essential (primary) hypertension: Secondary | ICD-10-CM

## 2010-09-20 DIAGNOSIS — I5030 Unspecified diastolic (congestive) heart failure: Secondary | ICD-10-CM

## 2010-09-20 DIAGNOSIS — R042 Hemoptysis: Secondary | ICD-10-CM

## 2010-09-20 NOTE — Progress Notes (Signed)
  Subjective:    Patient ID: [name], female    DOB: 1965/03/29, 45 y.o.   MRN: [id number]  HPI   50 yobf never smoker from [place] with recurrent Feb 2012 assoc with hbp/ chf    Last Admit for recurrent hemoptysis (3 rd episode)  09/06/10 hemoptysis resolved with lowering of BP   09/20/2010 f/u ov/Wert cc  Minimal dry cough day > night, no more bloody sputum.  Each time she's had hemoptysis it is assoc with hbp and she doesn't know what meds she takes for her hbp.  Pt denies any significant recent  sore throat, dysphagia, itching, sneezing,  nasal congestion or excess/ purulent secretions,  fever, chills, sweats, unintended wt loss, pleuritic or exertional cp, hempoptysis, orthopnea pnd or leg swelling.    Also denies any obvious fluctuation of symptoms with weather or environmental changes or other aggravating or alleviating factors.           Objective:   Physical Exam  [vitalsreviewed.  Wt 201 09/20/2010  Constitutional: She is oriented to person, place, and time. She appears well-developed and well-nourished. No distress.       obese  HENT:  Head: Normocephalic and atraumatic.  Right Ear: External ear normal.  Left Ear: External ear normal.  Mouth/Throat: Oropharynx is clear and moist. No oropharyngeal exudate.  Eyes: Conjunctivae and EOM are normal. Pupils are equal, round, and reactive to light. Right eye exhibits no discharge. Left eye exhibits no discharge. No scleral icterus.  Neck: Normal range of motion. Neck supple. No JVD present. No tracheal deviation present. No thyromegaly present.  Cardiovascular: Normal rate, regular rhythm, normal heart sounds and intact distal pulses.  Exam reveals no gallop and no friction rub.   No murmur heard. Pulmonary/Chest: Effort normal and breath sounds normal. No respiratory distress. She has no wheezes. She has no rales. She exhibits no tenderness.  Abdominal: Soft. Bowel sounds are normal. She exhibits no distension and no  mass. There is no tenderness. There is no rebound and no guarding.  Musculoskeletal: Normal range of motion. She exhibits no edema and no tenderness.  Lymphadenopathy:    She has no cervical adenopathy.           Assessment & Plan:

## 2010-09-20 NOTE — Assessment & Plan Note (Signed)
Apparently this only occurs with episodes of hbp and does not need further w/u at this point (deferred to Dr Marchelle Gearing)  but rather very careful f/u of her hbp

## 2010-09-20 NOTE — Assessment & Plan Note (Signed)
Marginal control and insight reviewed.  See instructions for specific recommendations which were reviewed directly with the patient who was given a copy with highlighter outlining the key components.

## 2010-09-20 NOTE — Assessment & Plan Note (Signed)
This patient does not know her meds and is very high risk for a fourth and potentially fatal episode of chf/hemptysis/ resp failure  I had an extended discussion with the patient today lasting 15 to 20 minutes of a 25 minute visit on the following issues:   Plans to f/u with her primary in one week, but after that needs a trust but verify visit with [place] to make sure she heard/understood the dangers of poor adherence to a complex rx.

## 2010-09-20 NOTE — Patient Instructions (Signed)
The list we made for you today is the best we could do but may not be perfect because it relies on your memory and our records.  PLEASE LET us KNOW RIGHT AWAY IF YOU FIND ERRORS ON IT AND WE WILL TRY TO CLARIFY AND CORRECT THE LIST GOING FORWARD,  KEEPING THIS LIST UPDATED AND ACCURATE IS CRITICAL TO KEEPING YOU HEALTHY.   Avoid salt and salty food as much as possible  Keep appointment with Dr Geralynn Ochs  Return to see [place] with all medication two weeks after you see your doctor (3 weeks from now) either Dr Marchelle Gearing or Tammy NP or Sherene Sires with all medications in hand

## 2010-10-10 ENCOUNTER — Ambulatory Visit (INDEPENDENT_AMBULATORY_CARE_PROVIDER_SITE_OTHER): Payer: Self-pay | Admitting: Adult Health

## 2010-10-10 ENCOUNTER — Encounter: Payer: Self-pay | Admitting: Adult Health

## 2010-10-10 DIAGNOSIS — I5030 Unspecified diastolic (congestive) heart failure: Secondary | ICD-10-CM

## 2010-10-10 DIAGNOSIS — R042 Hemoptysis: Secondary | ICD-10-CM

## 2010-10-10 NOTE — Assessment & Plan Note (Signed)
Compensated on present regimen Recently dx with hyperthyroidism w/ goiter -cont follow up as planned with endocrinology.

## 2010-10-10 NOTE — Patient Instructions (Signed)
Continue on current regimen.  Follow med calendar closely and bring to each visit.  follow up Dr. Marchelle Gearing in 6 weeks and As needed

## 2010-10-10 NOTE — Progress Notes (Signed)
  Subjective:    Patient ID: [name], female    DOB: Mar 12, 1965, 45 y.o.   MRN: [id number]  HPI 35 yobf never smoker from [place] with recurrent Feb 2012 assoc with hbp/ chf  Last Admit for recurrent hemoptysis (3 rd episode)  09/06/10 hemoptysis resolved with lowering of BP   09/20/2010 f/u ov/Wert cc  Minimal dry cough day > night, no more bloody sputum.  Each time she's had hemoptysis it is assoc with hbp and she doesn't know what meds she takes for her hbp. >>  10/10/2010 Follow up and med review.   Pt returns for follow up and med review. We reviewed all her meds and organized them into a med calendar format.  It appears she is taking her meds correctly. She does not have insurance and pays cash for meds so needs generic rx. We  Gave her samples of benicar. She can afford the rest of her meds most are on the $4 list. She can read english. Speaks good english.   Since last visit she is improved with less cough. No hemoptysis.  Recently dx with Goiter and Hperthyroidism. Awaiting endocrinology ov.          Objective:   Physical Exam  [vitalsreviewed.  Wt 201 09/20/2010 >>189 10/10/2010  Constitutional: She is oriented to person, place, and time. She appears well-developed and well-nourished. No distress.       obese  HENT:  Head: Normocephalic and atraumatic.  Right Ear: External ear normal.  Left Ear: External ear normal.  Mouth/Throat: Oropharynx is clear and moist. No oropharyngeal exudate.  Eyes: Conjunctivae and EOM are normal. Pupils are equal, round, and reactive to light. Right eye exhibits no discharge. Left eye exhibits no discharge. No scleral icterus.  Neck: Normal range of motion. Neck supple. No JVD present. No tracheal deviation present. Large goiter noted.  Cardiovascular: Normal rate, regular rhythm, normal heart sounds and intact distal pulses.  Exam reveals no gallop and no friction rub.   No murmur heard. Pulmonary/Chest: Effort normal and breath sounds  normal. No respiratory distress. She has no wheezes. She has no rales. She exhibits no tenderness.  Abdominal: Soft. Bowel sounds are normal. She exhibits no distension and no mass. There is no tenderness. There is no rebound and no guarding.  Musculoskeletal: Normal range of motion. She exhibits no edema and no tenderness.             Assessment & Plan:

## 2010-10-10 NOTE — Assessment & Plan Note (Signed)
Resolved  Patient's medications were reviewed today and patient education was given. Computerized medication calendar was adjusted/completed

## 2010-11-04 NOTE — Discharge Summary (Signed)
  NAMEFELICA, [name]                   ACCOUNT NO.:  [id number]  MEDICAL RECORD NO.:  [id number]  LOCATION:  3714                         FACILITY:  [facility]  PHYSICIAN:  Tarvaris Puglia I Dinh Ayotte, MD      DATE OF BIRTH:  03-31-1965  DATE OF ADMISSION:  09/06/2010 DATE OF DISCHARGE:  09/10/2010                              DISCHARGE SUMMARY   PRIMARY CARE PHYSICIAN:  Fleet Contras, MD  DISCHARGE DIAGNOSES: 1. Hemoptysis. 2. Recurrent alveolar hemorrhage, causes till now not clear. 3. Iron-deficiency anemia. 4. Diastolic congestive heart failure. 5. Hypertension.  CONSULTATION:  Pulmonary consulted.  PROCEDURE: 1. CT angio.  There is recurrent area of confluent patchy ground-glass     airspace opacity bilaterally.  Alveolar filling process such as     hemorrhage,  suggesting recurrent process. 2. Chest x-ray.  Vascular congestion. 3. Chest x-ray.  Interval resolution of bilateral pneumonia, stable   bronchitis/asthma.  HISTORY OF PRESENT ILLNESS:  This is a 45 year old female from [place], had admitted with recurrent hemoptysis, which happened suddenly without fever.  She has been previously admitted 2-3 times for the same complaint and diagnosis of alveolar hemorrhage made.  She underwent PPD, which was positive, but complement level was normal, HIV test was negative.  Drug screen negative.  BMP mildly elevated.  A 2-D echo did not suggest any mitral valve stenosis.  Anti-ANCA, anti-Smith antibody, and scleroderma were negative.  Suggestion was possibility of some strongyloidiasis or some lung flu.  Currently, the patient has a stool sample for strongyloidiasis, which is pending.  Pulmonary consulted where they recommend outpatient workup.  Hemoptysis was stopped.  She has an echo done in February 2008 to 2012, which did show EF of 55-60. Wall motion within normal.  Mitral valve mild thickening of both mitral leaflets and the posterior leaflet appears tethered.  There is trace-to- mild  posterior directed mitral insufficiency.  This case was discussed with Dr. Sherene Sires and he did not recommend further repeating 2-D echo.  She will need to follow up with Dr. Sherene Sires on September 20, 2010, at 10:30.     Jefferie Holston Bosie Helper, MD    HIE/MEDQ  D:  09/10/2010  T:  09/10/2010  Job:  [id number]  Electronically Signed by [name] MD on 11/04/2010 04:29:29 PM

## 2010-11-04 NOTE — H&P (Signed)
[name], [name]                   ACCOUNT NO.:  [id number]  MEDICAL RECORD NO.:  [id number]  LOCATION:  [facility]                         FACILITY:  [facility]  PHYSICIAN:  Kamaiyah Uselton I Hartman Minahan, MD      DATE OF BIRTH:  1965-09-24  DATE OF ADMISSION:  09/06/2010 DATE OF DISCHARGE:                             HISTORY & PHYSICAL   PRIMARY CARE PHYSICIAN:  Dr. Concepcion Elk.  CHIEF COMPLAINT:  Coughing blood, start last night.  HISTORY OF PRESENT ILLNESS:  This is a 45 year old African female, who presented to the emergency room with coughing up blood.  As per the patient, this symptoms started suddenly last night and she was feeling well and then suddenly she woke up at the mid of the night and she noticed some blood in the cough.  The patient admitted it was pure blood in the cough.  Condition associated with some shortness of breath and orthopnea and exertional dyspnea.  Also condition associated with chest pain, mainly retrosternal aside from esophagus up to her retrosternal area.  The patient has previously admitted to the hospital for alveolar hemorrhage.  Workup done including PPD, which was positive complementarity was normal and C4 within normal, and urine drug screen was negative.  HIV was negative.  BNP was 183.  Streptococcal in the urine was negative.  Apparently, causes of patient's alveolar hemorrhage was unknown.  The patient also had low-grade fever, but denies any shaking.  Denies any chills.  Denies any sore throat.  Denies any loss of weight.  PAST MEDICAL HISTORY:  Significant for; 1. Hypertension. 2. Alveolar hemorrhage and then fFurther treated for hospital-acquired     pneumonia. 3. Iron-deficiency anemia. 4. Diastolic congestive heart failure.  ALLERGIES:  No known drug allergies.  SOCIAL HISTORY:  Denies any smoking cigarette.  Denies any drinking alcohol or use of illicit drug.  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  FAMILY HISTORY:  Negative for diabetes,  hypertension, or stroke.  PAST SURGICAL HISTORY:  C-section.  REVIEW OF SYMPTOMS:  The patient complained of right knee pain for the last two months, also complained of orthopnea and exertional dyspnea. Denies any ongoing chest pain.  Denies any nausea, vomiting, or abdominal pain.  Denies any back pain.  PHYSICAL EXAMINATION:  VITAL SIGNS:  Temperature 102.1, blood pressure 113/47, pulse rate 122, respiratory rate 24, and saturating 98% on room air. GENERAL:  The patient is lying comfortably, not on shortness of breath, not using her accessory muscle for breathing. HEENT:  Anicteric.  No pallor.  No facial asymmetry.  Tongue is midline. CHEST:  Diminished air entry at the lower base.  No rhonchi.  No rales. HEART:  S1 and S2, tachy. ABDOMEN:  Soft, nontender.  Bowel sounds positive. CNS:  The patient is awake, alert, oriented x3 with no focal neuro deficits. EXTREMITIES:  There is some mild limitation of the right knee joint movement, but there is no effusion.  Chest x-ray did show bilateral diffuse bronchopneumonia.  CBC:  White blood cell 12.2, hemoglobin 10.4, hematocrit 33.2, platelets 252.  BMET: Sodium 136, potassium 4, chloride 101, CO2 27, glucose 97, BUN 13, creatinine 0.47, calcium  9.7, and D-dimer 0.72.  1. Hemoptysis could be related to the bronchopneumonia, but with a     history of alveolar hemorrhage, I will like to proceed with CT scan     for further evaluation.  Currently, we will treat the patient as a     case of community-acquired pneumonia with Zithromax and Rocephin.     We will get set of blood culture.  Also, we will provide the     patient with neb treatment.  We may involve Pulmonary consult     depend on the patient improvement and depending on hemoptysis and     CT scan finding. 2. Hypertension.  We will continue with patient home medication.     Blood pressure during admission was most significantly rated around     113/47. 3. Right knee pain.  On  examination, there is no evidence of effusion.     We will proceed with knee x-ray. 4. Diastolic congestive heart failure, seemed stable.  I will get pro     BNP and we will continue with Lasix 20 mg p.o. daily.  We will     provide the patient with sequential compression devices.  We will     monitor the patient for any evidence of hemoptysis.     Kahne Helfand Bosie Helper, MD     HIE/MEDQ  D:  09/06/2010  T:  09/06/2010  Job:  [id number]  cc:   Fleet Contras, M.D.  Electronically Signed by [name] MD on 11/04/2010 04:28:59 PM

## 2010-11-21 ENCOUNTER — Ambulatory Visit (INDEPENDENT_AMBULATORY_CARE_PROVIDER_SITE_OTHER): Payer: Medicaid Other | Admitting: Internal Medicine

## 2010-11-21 ENCOUNTER — Ambulatory Visit (INDEPENDENT_AMBULATORY_CARE_PROVIDER_SITE_OTHER)
Admission: RE | Admit: 2010-11-21 | Discharge: 2010-11-21 | Disposition: A | Discharge status: Home / Self Care | Payer: Medicaid Other | Source: Ambulatory Visit | Attending: Internal Medicine | Admitting: Internal Medicine

## 2010-11-21 ENCOUNTER — Encounter: Payer: Self-pay | Admitting: Internal Medicine

## 2010-11-21 VITALS — BP 132/70 | HR 80 | Temp 98.0°F | Ht 65.0 in | Wt 185.4 lb

## 2010-11-21 DIAGNOSIS — Z23 Encounter for immunization: Secondary | ICD-10-CM

## 2010-11-21 DIAGNOSIS — R053 Chronic cough: Secondary | ICD-10-CM

## 2010-11-21 DIAGNOSIS — R059 Cough, unspecified: Secondary | ICD-10-CM

## 2010-11-21 DIAGNOSIS — R042 Hemoptysis: Secondary | ICD-10-CM

## 2010-11-21 DIAGNOSIS — R058 Other specified cough: Secondary | ICD-10-CM

## 2010-11-21 DIAGNOSIS — R0602 Shortness of breath: Secondary | ICD-10-CM

## 2010-11-21 DIAGNOSIS — T464X5A Adverse effect of angiotensin-converting-enzyme inhibitors, initial encounter: Secondary | ICD-10-CM

## 2010-11-21 DIAGNOSIS — R05 Cough: Secondary | ICD-10-CM

## 2010-11-21 NOTE — Patient Instructions (Addendum)
Have cxr today - to make sure the spots during august 2012 visit for coughing blood have cleared up. Will call you with results Have flu shot today If cxr okay, no further need for followup Your cough is likely due to acid reflux; control your fat intake and take diet sheet with [place]. You can also try over the counter prilosec for this ................. Update: cxr clear:

## 2010-11-21 NOTE — Assessment & Plan Note (Signed)
No further episodes since august 2012. BP under control. CXR today clear. No further need for pulmonary followup. Advised to get bp under contrl. Hopefully Rx of hyper thyroidisim will help

## 2010-11-21 NOTE — Assessment & Plan Note (Signed)
RSI score 21 is c/w LPR cough. Cough is clearly related to gerd. CXR 11/21/10 is clear. Advised to control GERd. No need for active pulmonary fyu

## 2010-11-21 NOTE — Progress Notes (Signed)
Subjective:    Patient ID: [name], female    DOB: Apr 16, 1965, 45 y.o.   MRN: [id number]  HPI 42 yobf never smoker from [facility] with 1. Recurrent Feb 2012 assoc with hbp/ chf . Last Admit for recurrent hemoptysis (3 rd episode)  09/06/10 hemoptysis resolved with lowering of BP. PE ruled out 2. ACE inhibitor cough - May 2012 during Rx for hypertension 2. 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 Since then no furhter episodes. She lost 50# weight. Diagnosed with hyperthyroid i summer 2012. On med mgmt now and feeling better. Taking bp meds (has it in bag). BP good control currently. No major complaints  Only complaint is 1-2 episodes of dry cough each month related to fatty food intake. When they occur episodes are severe. Relieved by absence of fatty food. RSI cough score is 21 and c/w LPR cough - has severe feeling of tickle in throat and something sticking in her throat and moderate intensity of cough getting worse when shelies down and mild dificulty occasionally with food. Denies ACE inhibitor, or sinus drainage. CXR today: shows clearance of alveolar hemorrhage findings of august 2012  Past med hx: 1. Hyperthyroidisim since last ov with me in may 2012. On Rx. Developed rash with first agent but now on PTU without problems 2. 50# weight loss since last ov. She denies this is due to hyperthyroid 3. BP under control; does not know her meds but has list with her and she says she is compliant  Social and Fam Hx: unchanged       Review of Systems  Constitutional: Negative for fever and unexpected weight change.  HENT: Negative for ear pain, nosebleeds, congestion, sore throat, rhinorrhea, sneezing, trouble swallowing, dental problem, postnasal drip and sinus pressure.   Eyes: Negative for redness and  itching.  Respiratory: Negative for cough, chest tightness, shortness of breath and wheezing.   Cardiovascular: Negative for palpitations and leg swelling.  Gastrointestinal: Negative for nausea and vomiting.  Genitourinary: Negative for dysuria.  Musculoskeletal: Negative for joint swelling.  Skin: Negative for rash.  Neurological: Negative for headaches.  Hematological: Does not bruise/bleed easily.  Psychiatric/Behavioral: Negative for dysphoric mood. The patient is not nervous/anxious.        Objective:   Physical Exam Wt 201 09/20/2010 >>189 10/10/2010  > 185# 11/21/10 Constitutional: She is oriented to person, place, and time. She appears well-developed and well-nourished. No distress.  HENT:  GOITER + Head: Normocephalic and atraumatic. Mild sweat + Right Ear: External ear normal.  Left Ear: External ear normal.  Mouth/Throat: Oropharynx is clear and moist. No oropharyngeal exudate.  Eyes: Conjunctivae and EOM are normal. Pupils are equal, round, and reactive to light. Right eye exhibits no discharge. Left eye exhibits no discharge. No scleral icterus.  Neck: Normal range of motion. Neck supple. No JVD present. No tracheal deviation present. Large goiter noted + Cardiovascular: Normal rate, regular rhythm, normal heart sounds and intact distal pulses.  Exam reveals no gallop and no friction rub.   SYSTOLIC MURMUR + No murmur heard. Pulmonary/Chest: Effort normal and breath sounds normal. No respiratory distress. She has no wheezes. She has no rales. She exhibits no tenderness.  Abdominal: Soft. Bowel sounds are normal. She exhibits no distension and no mass. There is no tenderness. There  is no rebound and no guarding.  Musculoskeletal: Normal range of motion. She exhibits no edema and no tenderness.             Assessment & Plan:

## 2010-11-28 ENCOUNTER — Encounter: Payer: Self-pay | Admitting: Family Medicine

## 2010-11-28 ENCOUNTER — Ambulatory Visit (INDEPENDENT_AMBULATORY_CARE_PROVIDER_SITE_OTHER): Payer: Self-pay | Admitting: Family Medicine

## 2010-11-28 DIAGNOSIS — R042 Hemoptysis: Secondary | ICD-10-CM

## 2010-11-28 DIAGNOSIS — M2550 Pain in unspecified joint: Secondary | ICD-10-CM

## 2010-11-28 DIAGNOSIS — E0590 Thyrotoxicosis, unspecified without thyrotoxic crisis or storm: Secondary | ICD-10-CM

## 2010-11-28 DIAGNOSIS — I5030 Unspecified diastolic (congestive) heart failure: Secondary | ICD-10-CM

## 2010-11-28 DIAGNOSIS — I10 Essential (primary) hypertension: Secondary | ICD-10-CM

## 2010-11-28 DIAGNOSIS — Z862 Personal history of diseases of the blood and blood-forming organs and certain disorders involving the immune mechanism: Secondary | ICD-10-CM

## 2010-11-28 NOTE — Progress Notes (Signed)
  Subjective:    Patient ID: [name], female    DOB: 1965/11/06, 45 y.o.   MRN: [id number]   HPI Very pleasant 45 y/o F originally from the [place] that comes to establish care in our center. She has been f/u previously by Pulmonology [facility] and Cardiology. Has hx of HTN and early this year started to have hemoptysis. She was admitted a total of 3 times due to this condition and has had a complete work up  including antibody titers looking for vasculitis, all with negative results. Las visit with her Pulmonologist was 11/26/10 and after CXR negative and asymptomatic the f/u with him for this condition was concluded.    CHF: Diastolic Heart failure diagnosis also during this year hospitalizations with ECHO  EF 65-70% BNP elevated. She is controlled with furosemide and antihypertensives. Today no edema, SOB, orthopnea. HTN: On Amlodipine, Metoprolol. Was on Olmesartan but this medication was discontinued since last visit to [facility]. Pt was not clear about this so she continued to take it. BP controlled 134/80. No headaches, vision changes, chest pain or SOB, no cough. Hyperthyroidism: She also was found this year to have a low TSH and has been f/u by Dr. Margaretmary Bayley who has her on PTU. No imaging of thyroid at this time has been performed. No palpitations or chest pain, no tremors, no changes in skin and /or hair noticed. Anemia: Microcytic with anemia panel most likely Iron defficency. Pt with Hx of heavy periods. She stated her periods have improved and now they last less longer and are less heavy. No taking Iron pills at this moment. 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 But today she complaints of right elbow pain 8/10 that started about a month ago. Tramadol also seems to help. No edema, no erythema, no calor. Only mild decrease ROM due to pain. No morning stiffness reported. No changes on her urine, or bowel movements.  Review of  Systems Please refer to HPI.    Objective:   Physical Exam  Constitutional: She is oriented to person, place, and time. She appears well-developed. No distress.  HENT:  Head: Normocephalic and atraumatic.  Mouth/Throat: No oropharyngeal exudate.  Eyes: EOM are normal. Pupils are equal, round, and reactive to light.  Neck: Neck supple. No JVD present. No tracheal deviation present. Thyromegaly present.       Presence of goiter more noticeable on the right. Soft to palpation non tender. Non nodular surface noted.   Cardiovascular: Normal rate, regular rhythm and normal heart sounds.   No murmur heard. Pulmonary/Chest: Breath sounds normal. No respiratory distress. She has no wheezes. She has no rales. She exhibits no tenderness.  Abdominal: Bowel sounds are normal. She exhibits no distension and no mass. There is no tenderness.  Musculoskeletal:       No edema, erythema or calor on knees or right elbow. Pain to normal ROM but not limitation of movement.  Lymphadenopathy:    She has no cervical adenopathy.  Neurological: She is alert and oriented to person, place, and time. She has normal reflexes. No cranial nerve deficit.  Psychiatric: She has a normal mood and affect. Her behavior is normal. Judgment and thought content normal.          Assessment & Plan:

## 2010-11-28 NOTE — Patient Instructions (Signed)
It has been a pleasure to meet you. I will give you a call if we need to do any other tests. Please make an appointment with me in one month.

## 2010-11-29 DIAGNOSIS — I5030 Unspecified diastolic (congestive) heart failure: Secondary | ICD-10-CM | POA: Insufficient documentation

## 2010-11-29 DIAGNOSIS — E0590 Thyrotoxicosis, unspecified without thyrotoxic crisis or storm: Secondary | ICD-10-CM | POA: Insufficient documentation

## 2010-11-29 DIAGNOSIS — M2550 Pain in unspecified joint: Secondary | ICD-10-CM | POA: Insufficient documentation

## 2010-11-29 DIAGNOSIS — Z862 Personal history of diseases of the blood and blood-forming organs and certain disorders involving the immune mechanism: Secondary | ICD-10-CM | POA: Insufficient documentation

## 2010-11-29 HISTORY — DX: Personal history of diseases of the blood and blood-forming organs and certain disorders involving the immune mechanism: Z86.2

## 2010-11-29 HISTORY — DX: Unspecified diastolic (congestive) heart failure: I50.30

## 2010-11-29 NOTE — Assessment & Plan Note (Signed)
Asymptomatic since last hospitalization.  Plan: Continue monitoring. Stop Olmesartan as recommended last visit with Pulmonology.  Continue tight control of her BP and CHF.

## 2010-11-29 NOTE — Assessment & Plan Note (Signed)
Menses have decressed in flow and duration. No fatigue, SOB or other symptoms. Basal Hb around 9. Last Hb 10.1. Microcytic. Plan: Recheck Hb next visit.

## 2010-11-29 NOTE — Assessment & Plan Note (Signed)
Will try to contact Dr. Chestine Spore to coordinate care.

## 2010-11-29 NOTE — Assessment & Plan Note (Signed)
No edema, no SOB, No orthopnea. Plan: BP controlled and Furosemide 20 mg daily. Continue regimen.

## 2010-11-29 NOTE — Assessment & Plan Note (Signed)
New right elbow pain. No edema, erythema or calor. Mild decrease on ROM due to pain. No morning stiffess Plan: Continue tramadol as this helps. We will continue monitoring due to the pt hx of hemoptysis and now joint involvement with no apparent signs of trauma or infection.

## 2010-11-29 NOTE — Assessment & Plan Note (Signed)
BP today 134/80 mmHg. On Metoprolol 50 mg BID and Amlodipine 5 mg daily. Plan: Continue treatment. Monitor

## 2010-11-29 NOTE — Assessment & Plan Note (Signed)
Controlled. No edema, SOB, orthopnea. Plan: Continue BP management and Furosemide 20 mg daily.

## 2011-01-02 ENCOUNTER — Ambulatory Visit (INDEPENDENT_AMBULATORY_CARE_PROVIDER_SITE_OTHER): Payer: Self-pay | Admitting: Family Medicine

## 2011-01-02 ENCOUNTER — Encounter: Payer: Self-pay | Admitting: Family Medicine

## 2011-01-02 VITALS — BP 123/75 | HR 74 | Temp 98.5°F | Ht 65.0 in | Wt 195.2 lb

## 2011-01-02 DIAGNOSIS — Z23 Encounter for immunization: Secondary | ICD-10-CM

## 2011-01-02 DIAGNOSIS — R042 Hemoptysis: Secondary | ICD-10-CM

## 2011-01-02 DIAGNOSIS — E0590 Thyrotoxicosis, unspecified without thyrotoxic crisis or storm: Secondary | ICD-10-CM

## 2011-01-02 DIAGNOSIS — I5030 Unspecified diastolic (congestive) heart failure: Secondary | ICD-10-CM

## 2011-01-02 DIAGNOSIS — I10 Essential (primary) hypertension: Secondary | ICD-10-CM

## 2011-01-02 NOTE — Assessment & Plan Note (Addendum)
Controled, BP 123/75 today. Compliant with med (Amlodipine 5mg , Metoprolol 50 mg BID, furosemide 20 mg daily) Plan: Labs CBC, CMET, Lipid Profile, BNP(F/U) F/u appointment in 2 months.

## 2011-01-02 NOTE — Assessment & Plan Note (Signed)
No edema, orthopnea, SOB. On furosemide 20 mg and BP management. Plan: BNP and f/u in 2 months.

## 2011-01-02 NOTE — Assessment & Plan Note (Signed)
Asymptomatic. Plan:  Monitor for cough, hemophthisis.

## 2011-01-02 NOTE — Assessment & Plan Note (Addendum)
No Imaging done Plan: U/S of neck soft tissues, thyroid.

## 2011-01-02 NOTE — Progress Notes (Signed)
Subjective:     Patient ID: [name], female DOB: 1965/04/18, 45 y.o.   MRN: [id number]  HPI Pt that comes for f/u. She has not complaints at this time. No cough, SOB, orthopnea, palpitations or chest pain. BP has been controlled with medications. She reports been compliant. She had a visit with Dr. Chestine Spore and had some labs done but not imaging of her neck. The pain in her elbow had resolved completely and her knees hurt but she can control pain with ibuprofen/tylenol and some times tramadol. No morning stiffness, erythema, or edema. Had increased 2 kg since last visit a month ago.  Review of Systems  Per HPI  Objective:   Physical Exam  Constitutional: She is oriented to person, place, and time. She appears well-developed. No distress.  HENT:  Head: Normocephalic and atraumatic.  Mouth/Throat: No oropharyngeal exudate.  Eyes: EOM are normal. Pupils are equal, round, and reactive to light.  Neck: Neck supple. No JVD present. No tracheal deviation present. Thyromegaly present.       Presence of goiter more noticeable on the right. Soft to palpation non tender. Non nodular surface noted.   Cardiovascular: Normal rate, regular rhythm and normal heart sounds.   No murmur heard. Pulmonary/Chest: Breath sounds normal. No respiratory distress. She has no wheezes. She has no rales. She exhibits no tenderness.  Abdominal: Bowel sounds are normal. She exhibits no distension and no mass. There is no tenderness.  Musculoskeletal:       No edema, erythema or calor on knees. Mild pain but not limited ROM  Lymphadenopathy:    She has no cervical adenopathy.  Neurological: She is alert and oriented to person, place, and time. She has normal reflexes. No cranial nerve deficit.  Psychiatric: She has a normal mood and affect. Her behavior is normal. Judgment and thought content normal.       Assessment:        Plan:

## 2011-01-02 NOTE — Patient Instructions (Signed)
It has been a pleasure to see you today. Please take the medications as prescribed. I will call you with the labs results if they come back abnormal otherwise you will receive a letter. Make your next appointment in 2 months.

## 2011-01-07 ENCOUNTER — Other Ambulatory Visit: Payer: Self-pay

## 2011-01-07 ENCOUNTER — Ambulatory Visit (HOSPITAL_COMMUNITY)
Admission: RE | Admit: 2011-01-07 | Discharge: 2011-01-07 | Disposition: A | Discharge status: Home / Self Care | Payer: Self-pay | Source: Ambulatory Visit | Attending: Family Medicine | Admitting: Family Medicine

## 2011-01-07 DIAGNOSIS — I10 Essential (primary) hypertension: Secondary | ICD-10-CM

## 2011-01-07 DIAGNOSIS — E049 Nontoxic goiter, unspecified: Secondary | ICD-10-CM | POA: Insufficient documentation

## 2011-01-07 LAB — COMPREHENSIVE METABOLIC PANEL
AST: 25 U/L (ref 0–37)
Albumin: 3.9 g/dL (ref 3.5–5.2)
Alkaline Phosphatase: 117 U/L (ref 39–117)
BUN: 9 mg/dL (ref 6–23)
Glucose, Bld: 90 mg/dL (ref 70–99)
Potassium: 4.3 mEq/L (ref 3.5–5.3)
Total Bilirubin: 0.3 mg/dL (ref 0.3–1.2)

## 2011-01-07 LAB — CBC
HCT: 36.8 % (ref 36.0–46.0)
Hemoglobin: 11.4 g/dL — ABNORMAL LOW (ref 12.0–15.0)
MCH: 24.4 pg — ABNORMAL LOW (ref 26.0–34.0)
MCHC: 31 g/dL (ref 30.0–36.0)
RDW: 16.7 % — ABNORMAL HIGH (ref 11.5–15.5)

## 2011-01-07 LAB — LIPID PANEL
Cholesterol: 159 mg/dL (ref 0–200)
HDL: 43 mg/dL (ref >39)
LDL Cholesterol: 107 mg/dL — ABNORMAL HIGH (ref 0–99)
Triglycerides: 47 mg/dL (ref <150)
VLDL: 9 mg/dL (ref 0–40)

## 2011-01-07 NOTE — Progress Notes (Signed)
FLP,TSH,CBC,CMP AND BNP DONE TODAY Armentha Branagan

## 2011-01-08 LAB — BRAIN NATRIURETIC PEPTIDE: Brain Natriuretic Peptide: 26.1 pg/mL (ref 0.0–100.0)

## 2011-01-17 ENCOUNTER — Encounter: Payer: Self-pay | Admitting: Family Medicine

## 2011-04-03 ENCOUNTER — Other Ambulatory Visit: Payer: Self-pay

## 2011-04-03 ENCOUNTER — Ambulatory Visit: Payer: Self-pay | Admitting: Family Medicine

## 2011-04-03 ENCOUNTER — Ambulatory Visit (INDEPENDENT_AMBULATORY_CARE_PROVIDER_SITE_OTHER): Payer: Self-pay | Admitting: Family Medicine

## 2011-04-03 ENCOUNTER — Encounter: Payer: Self-pay | Admitting: Family Medicine

## 2011-04-03 VITALS — BP 124/80 | HR 76 | Temp 98.2°F | Ht 65.0 in | Wt 209.0 lb

## 2011-04-03 DIAGNOSIS — N926 Irregular menstruation, unspecified: Secondary | ICD-10-CM

## 2011-04-03 DIAGNOSIS — E0590 Thyrotoxicosis, unspecified without thyrotoxic crisis or storm: Secondary | ICD-10-CM

## 2011-04-03 DIAGNOSIS — N938 Other specified abnormal uterine and vaginal bleeding: Secondary | ICD-10-CM

## 2011-04-03 DIAGNOSIS — I10 Essential (primary) hypertension: Secondary | ICD-10-CM

## 2011-04-03 DIAGNOSIS — N939 Abnormal uterine and vaginal bleeding, unspecified: Secondary | ICD-10-CM

## 2011-04-03 DIAGNOSIS — N921 Excessive and frequent menstruation with irregular cycle: Secondary | ICD-10-CM

## 2011-04-03 LAB — CBC WITH DIFFERENTIAL/PLATELET
Basophils Relative: 0 % (ref 0–1)
HCT: 35.6 % — ABNORMAL LOW (ref 36.0–46.0)
Hemoglobin: 10.6 g/dL — ABNORMAL LOW (ref 12.0–15.0)
Lymphocytes Relative: 49 % — ABNORMAL HIGH (ref 12–46)
Lymphs Abs: 3 10*3/uL (ref 0.7–4.0)
MCHC: 29.8 g/dL — ABNORMAL LOW (ref 30.0–36.0)
Monocytes Absolute: 0.6 10*3/uL (ref 0.1–1.0)
Monocytes Relative: 9 % (ref 3–12)
Neutro Abs: 2.1 10*3/uL (ref 1.7–7.7)
Neutrophils Relative %: 35 % — ABNORMAL LOW (ref 43–77)
RBC: 4.37 MIL/uL (ref 3.87–5.11)
WBC: 6.1 10*3/uL (ref 4.0–10.5)

## 2011-04-03 LAB — TSH: TSH: <0.008 u[IU]/mL — ABNORMAL LOW (ref 0.350–4.500)

## 2011-04-03 LAB — POCT URINE PREGNANCY: Preg Test, Ur: NEGATIVE

## 2011-04-03 MED ORDER — MEDROXYPROGESTERONE ACETATE 10 MG PO TABS: 10.0000 mg | ORAL_TABLET | Freq: Every day | ORAL | Status: AC

## 2011-04-03 MED ORDER — AMLODIPINE BESYLATE 5 MG PO TABS: 5.0000 mg | ORAL_TABLET | Freq: Every day | ORAL | Status: DC

## 2011-04-03 NOTE — Patient Instructions (Signed)
Everything seems ok on your exam.   We will check some blood work today and let you know if there is a problem. I will set up an ultrasound. You can try a course of a hormone.  It is called progesterone.  You take it for 5 days, and you will probably have a period after that.

## 2011-04-04 ENCOUNTER — Other Ambulatory Visit: Payer: Self-pay

## 2011-04-04 DIAGNOSIS — N938 Other specified abnormal uterine and vaginal bleeding: Secondary | ICD-10-CM

## 2011-04-04 DIAGNOSIS — I10 Essential (primary) hypertension: Secondary | ICD-10-CM

## 2011-04-04 HISTORY — DX: Other specified abnormal uterine and vaginal bleeding: N93.8

## 2011-04-04 MED ORDER — AMLODIPINE BESYLATE 5 MG PO TABS: 5.0000 mg | ORAL_TABLET | Freq: Every day | ORAL | Status: DC

## 2011-04-04 NOTE — Progress Notes (Signed)
  Subjective:    Patient ID: [name], female    DOB: [birth year], 46 y.o.   MRN: [id number]  HPI 46 yo with hyperthyroidism presents with DUB.  She reports that for 3 months,she has had heavy periods q 3 weeks.  She bleeds though a super tampon in 30 minutes.  She denies dizziness or chest pain.  Her thyroid is treated by an endocrinologist.  She denies cramping.  She is not on hormonal treatments.  She denies any previous problems with her periods, has always had monthly menses.  She did have some heat intolerance with her hyperthyroidism.   Review of SystemsSee HPI     Objective:   Physical Exam  Constitutional: She appears well-developed and well-nourished. No distress.  Genitourinary:       Nl external female without lesions.  Nl vagina, scant amount of blood in vault.  Nl cervix without lesions.  Bimanual difficult due to body habitus, but no uterine enlargement or adnexal mass appreciated.  No CMT or adnexal TTP.  Skin: She is not diaphoretic.          Assessment & Plan:

## 2011-04-04 NOTE — Assessment & Plan Note (Signed)
Pt with metromenorrhagia.  Perhaps due to perimenopausal anovulation.  Will check ultrasound for structural causes of menorrhagia.  Trial of progesterone to induce withdrawal bleed.  If continues to have DUB, consider EmBx.

## 2011-04-07 ENCOUNTER — Ambulatory Visit (HOSPITAL_COMMUNITY)
Admission: RE | Admit: 2011-04-07 | Discharge: 2011-04-07 | Disposition: A | Discharge status: Home / Self Care | Payer: Self-pay | Source: Ambulatory Visit | Attending: Family Medicine | Admitting: Family Medicine

## 2011-04-07 DIAGNOSIS — N83209 Unspecified ovarian cyst, unspecified side: Secondary | ICD-10-CM | POA: Insufficient documentation

## 2011-04-07 DIAGNOSIS — N938 Other specified abnormal uterine and vaginal bleeding: Secondary | ICD-10-CM | POA: Insufficient documentation

## 2011-04-07 DIAGNOSIS — N949 Unspecified condition associated with female genital organs and menstrual cycle: Secondary | ICD-10-CM | POA: Insufficient documentation

## 2011-04-07 DIAGNOSIS — N921 Excessive and frequent menstruation with irregular cycle: Secondary | ICD-10-CM

## 2011-04-07 DIAGNOSIS — D251 Intramural leiomyoma of uterus: Secondary | ICD-10-CM | POA: Insufficient documentation

## 2011-04-08 ENCOUNTER — Encounter: Payer: Self-pay | Admitting: Family Medicine

## 2011-04-08 ENCOUNTER — Other Ambulatory Visit: Payer: Self-pay | Admitting: Family Medicine

## 2011-04-08 MED ORDER — DOCUSATE SODIUM 100 MG PO CAPS: ORAL_CAPSULE | ORAL | Status: DC

## 2011-04-08 MED ORDER — FERROUS SULFATE 325 (65 FE) MG PO TABS: 325.0000 mg | ORAL_TABLET | Freq: Every day | ORAL | Status: DC

## 2011-04-28 ENCOUNTER — Ambulatory Visit: Payer: Self-pay | Admitting: Family Medicine

## 2011-05-01 ENCOUNTER — Ambulatory Visit (INDEPENDENT_AMBULATORY_CARE_PROVIDER_SITE_OTHER): Payer: Self-pay | Admitting: Family Medicine

## 2011-05-01 ENCOUNTER — Encounter: Payer: Self-pay | Admitting: Family Medicine

## 2011-05-01 VITALS — BP 118/76 | HR 72 | Ht 65.0 in | Wt 210.0 lb

## 2011-05-01 DIAGNOSIS — M25569 Pain in unspecified knee: Secondary | ICD-10-CM

## 2011-05-01 DIAGNOSIS — E0590 Thyrotoxicosis, unspecified without thyrotoxic crisis or storm: Secondary | ICD-10-CM

## 2011-05-01 DIAGNOSIS — N939 Abnormal uterine and vaginal bleeding, unspecified: Secondary | ICD-10-CM

## 2011-05-01 DIAGNOSIS — N926 Irregular menstruation, unspecified: Secondary | ICD-10-CM

## 2011-05-01 DIAGNOSIS — M2550 Pain in unspecified joint: Secondary | ICD-10-CM

## 2011-05-01 DIAGNOSIS — N938 Other specified abnormal uterine and vaginal bleeding: Secondary | ICD-10-CM

## 2011-05-01 MED ORDER — MELOXICAM 7.5 MG PO TABS: 7.5000 mg | ORAL_TABLET | Freq: Every day | ORAL | Status: DC

## 2011-05-01 NOTE — Assessment & Plan Note (Signed)
Progesterone stopped bleeding. Pt awaiting next cycle to evaluate.

## 2011-05-01 NOTE — Progress Notes (Signed)
  Subjective:    Patient ID: [name], female    DOB: 01-26-1965, 46 y.o.   MRN: [id number]  HPI Pt that comes today to f/u her hyperthyroidism and also has right knee pain. #1. Hyperthyroidism: pt previously f/u by Dr. Chestine Spore, on PTU that recently was reduce dose to 150mg  BID (was TID) her TSH is the same that 3 months ago. (0.008 low). Pt does not know why her medication was decreased. She does not want to continue care with Endocrinology and also declines/refuses any surgical intervention at this time. Pt has signed ROI but we haven't received her records form Dr. Ophelia Charter office. No palpitations,chest pain, or other symptoms. Denies side effects of PTU. No rashes, normal LFT's in December/12 #2. Right Knee pain: monoarticular intermittent pain that alleviates with ibuprofen once a day, but limits her daily function. Mild knee edema per pt's perception. No increase in temperature, no erythema. #3. Pt recently came with DUB and had tx with progesterone and a pelvic U/S that showed small fibroids, with one of them having mass effect to endometrium and could correlate with symptoms. (Other findings: small right ovarian paraovarian cyst or hydrosalpinx) bleeding resolved with progesterone, LMP was 3/28. She is awaiting for her next cycle to evaluate intensity of bleeding.   Review of Systems  Constitutional: Negative.   HENT: Negative.   Eyes: Negative.   Respiratory: Negative.   Cardiovascular: Negative.   Gastrointestinal: Negative.   Genitourinary: Negative.   Musculoskeletal: Positive for joint swelling and arthralgias.  Neurological: Negative.        Objective:   Physical Exam  Constitutional: No distress.  HENT:  Mouth/Throat: Oropharynx is clear and moist.  Neck: Normal range of motion. Neck supple.  No JVD present. No tracheal deviation present. Thyromegaly present.  Presence of goiter more noticeable on the right. Soft to palpation non tender. Non nodular surface noted.    Cardiovascular: Normal rate, regular rhythm and normal heart sounds.  No murmur heard.  Pulmonary/Chest: Breath sounds normal. No respiratory distress. She has no wheezes. She has no rales. She exhibits no tenderness.  Abdominal: Bowel sounds are normal. She exhibits no distension and no mass. There is no tenderness.  Musculoskeletal:  No edema, erythema or calor on right knee. Pain to normal ROM but not limitation of movement. No other maneuver explored due to tenderness. Lymphadenopathy:  She has no cervical adenopathy.  Neurological: She is alert and oriented to person, place, and time. She has normal reflexes. No cranial nerve deficit.      Assessment & Plan:

## 2011-05-01 NOTE — Assessment & Plan Note (Addendum)
Pt was recently changed to PTU 150 mg BID not sure why. TSH still same than 4 months ago 0.008 (low). Asymptomatic. No rashes, last LFT's WNL in December/12. Pt does not want to continue endocrinology consult and also refuses any surgical intervention. ROI signed today (twice) to be able to obtain prior studies/ medical evaluation.

## 2011-05-01 NOTE — Assessment & Plan Note (Signed)
Right knee pain, no signs of effusion, limited physical exam due to pain. Pt declines narcotic pain medication.(She is not taking tramadol)  Plan: Mobic 7.5 mg daily Sports medicine consult.

## 2011-05-02 NOTE — Patient Instructions (Signed)
It has been a pleasure to see you today. Please take the medications as prescribed. Do not take ibuprofen if you are taking the new medication (mobic) Make an appointment for sports medicine consult regarding your right knee. Make a f/u appointment in a month.

## 2011-05-05 ENCOUNTER — Ambulatory Visit (INDEPENDENT_AMBULATORY_CARE_PROVIDER_SITE_OTHER): Payer: Self-pay | Admitting: Family Medicine

## 2011-05-05 ENCOUNTER — Encounter: Payer: Self-pay | Admitting: Family Medicine

## 2011-05-05 VITALS — BP 120/82 | HR 61 | Temp 98.0°F | Ht 65.0 in | Wt 210.0 lb

## 2011-05-05 DIAGNOSIS — M25569 Pain in unspecified knee: Secondary | ICD-10-CM

## 2011-05-05 DIAGNOSIS — M25561 Pain in right knee: Secondary | ICD-10-CM

## 2011-05-05 MED ORDER — MELOXICAM 7.5 MG PO TABS: ORAL_TABLET | ORAL | Status: DC

## 2011-05-05 NOTE — Patient Instructions (Addendum)
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 Start physical therapy for 1-3 visits then do home exercise program focusing on quad strengthening. Take meloxicam 2 tablets daily with food. Ice your knee 15 minutes at a time 3-4 times a day. Elevate above the level of your heart, use ace wrap as needed for swelling. Follow up with me in 1 month for a recheck. If not improving as expected would consider cortisone injection at that time.

## 2011-05-06 ENCOUNTER — Encounter: Payer: Self-pay | Admitting: Family Medicine

## 2011-05-06 DIAGNOSIS — M25561 Pain in right knee: Secondary | ICD-10-CM

## 2011-05-06 HISTORY — DX: Pain in right knee: M25.561

## 2011-05-06 NOTE — Progress Notes (Signed)
Subjective:    Patient ID: [name], female    DOB: 1965-01-28, 46 y.o.   MRN: [id number]  PCP: Dr. Aviva Signs  HPI 46 yo F here for right knee pain.  Patient reports having had intermittent right knee pain for 9 months but more consistent over past 1 month. Denies known trauma or injury. Associated with swelling, popping noises when standing up. Pain is deep anterior pain. Worse with stairs. Just started meloxicam past few days but hasn't noted much difference to date. No locking, catching, giving out.  Past Medical History  Diagnosis Date  . Hypertension     DX SEVERAL DECADES YRS AGO- UNTRREATED UNTIL ADMISSION 02/2010  . Diastolic heart failure 02/2010    NEW ON ECHO 02/2010  . Hemoptysis 02/2010    DUE TO ALV HGE.Marland KitchenMarland KitchenANA NEGATIVE...DEEMED DUE TO DIASTOLIC CHF  . PPD positive     NEGATIVE DURING CHILDHOOD IN [facility]..1ST + TEST 1993 DURING IMMIGRATION GRREN CARD CLEARENCE.Marland KitchenCXR NORMAL 1993 PER HX..s/p INR Rx AT [facility].Marland KitchenREPEAT + 02/2010  . Cough due to angiotensin-converting enzyme inhibitor 05/27/2010  . Hyperthyroidism     Current Outpatient Prescriptions on File Prior to Visit  Medication Sig Dispense Refill  . amLODipine (NORVASC) 5 MG tablet Take 1 tablet (5 mg total) by mouth daily.  30 tablet  2  . docusate sodium (COLACE) 100 MG capsule 1-2 po daily as needed for constipation.  100 capsule  0  . ferrous sulfate 325 (65 FE) MG tablet Take 1 tablet (325 mg total) by mouth daily with breakfast.  30 tablet  11  . furosemide (LASIX) 20 MG tablet Take 1 tablet (20 mg total) by mouth daily.  30 tablet  13  . medroxyPROGESTERone (PROVERA) 10 MG tablet Take 1 tablet (10 mg total) by mouth daily.  5 tablet  0  . metoprolol (LOPRESSOR) 50 MG tablet Take 50 mg by mouth 2 (two) times daily.        Marland Kitchen propylthiouracil (PTU) 50 MG tablet Take 150 mg by mouth 3 (three) times daily.          Past Surgical History  Procedure Date  . Cesarean section     No  Known Allergies  History   Social History  . Marital Status: Single    Spouse Name: N/A    Number of Children: N/A  . Years of Education: N/A   Occupational History  . Not on file.   Social History Main Topics  . Smoking status: Never Smoker   . Smokeless tobacco: Never Used  . Alcohol Use: Yes  . Drug Use: No  . Sexually Active: Yes    Birth Control/ Protection: Surgical   Other Topics Concern  . Not on file   Social History Narrative  . No narrative on file    Family History  Problem Relation Age of Onset  . Hypertension Father   . Sudden death Father   . Hyperlipidemia Neg Hx   . Heart attack Neg Hx   . Diabetes Neg Hx     BP 120/82  Pulse 61  Temp(Src) 98 F (36.7 C) (Oral)  Ht 5\' 5"  (1.651 m)  Wt 210 lb (95.255 kg)  BMI 34.95 kg/m2  LMP 04/17/2011   Review of Systems See hpi above.    Objective:   Physical Exam Gen: NAD  R knee: No gross deformity, ecchymoses, swelling. Mild TTP medial joint line.  No lateral joint line, post patellar facet, or other TTP about  knee. FROM. Negative ant/post drawers. Negative valgus/varus testing. Negative lachmanns. + medial mcmurrays, apleys.  Negative patellar apprehension, clarkes. NV intact distally.  L knee: FROM without pain, instability.    Assessment & Plan:  1. Right knee pain - radiographs done about 8-9 months ago (though not weight bearing) show minimal evidence of DJD.  History and exam consistent with degenerative meniscal tear - will try conservative care first.  Declined injection at this time.  Increase mobic to 15mg  daily.  Start PT for 1-3 visits then transition to HEP.  Ice as needed.  F/u in 1 month for recheck.  If not improving consider cortisone injection and/or MRI.

## 2011-05-06 NOTE — Assessment & Plan Note (Signed)
radiographs done about 8-9 months ago (though not weight bearing) show minimal evidence of DJD.  History and exam consistent with degenerative meniscal tear - will try conservative care first.  Declined injection at this time.  Increase mobic to 15mg  daily.  Start PT for 1-3 visits then transition to HEP.  Ice as needed.  F/u in 1 month for recheck.  If not improving consider cortisone injection and/or MRI.

## 2011-05-08 ENCOUNTER — Ambulatory Visit: Payer: Self-pay | Attending: Family Medicine | Admitting: Physical Therapy

## 2011-05-08 DIAGNOSIS — IMO0001 Reserved for inherently not codable concepts without codable children: Secondary | ICD-10-CM | POA: Insufficient documentation

## 2011-05-08 DIAGNOSIS — M25569 Pain in unspecified knee: Secondary | ICD-10-CM | POA: Insufficient documentation

## 2011-05-12 ENCOUNTER — Encounter: Payer: Self-pay | Admitting: Rehabilitation

## 2011-05-14 ENCOUNTER — Ambulatory Visit: Payer: Self-pay

## 2011-05-21 ENCOUNTER — Ambulatory Visit: Payer: Self-pay | Attending: Family Medicine | Admitting: Physical Therapy

## 2011-05-21 DIAGNOSIS — IMO0001 Reserved for inherently not codable concepts without codable children: Secondary | ICD-10-CM | POA: Insufficient documentation

## 2011-05-21 DIAGNOSIS — M25569 Pain in unspecified knee: Secondary | ICD-10-CM | POA: Insufficient documentation

## 2011-05-23 ENCOUNTER — Ambulatory Visit: Payer: Self-pay | Admitting: Physical Therapy

## 2011-06-03 ENCOUNTER — Ambulatory Visit: Payer: Self-pay | Admitting: Family Medicine

## 2011-07-10 ENCOUNTER — Other Ambulatory Visit: Payer: Self-pay

## 2011-07-10 DIAGNOSIS — I10 Essential (primary) hypertension: Secondary | ICD-10-CM

## 2011-07-10 MED ORDER — FUROSEMIDE 20 MG PO TABS: 20.0000 mg | ORAL_TABLET | Freq: Every day | ORAL | Status: DC

## 2011-07-14 ENCOUNTER — Other Ambulatory Visit: Payer: Self-pay | Admitting: *Deleted

## 2011-07-14 DIAGNOSIS — M25569 Pain in unspecified knee: Secondary | ICD-10-CM

## 2011-07-14 MED ORDER — MELOXICAM 7.5 MG PO TABS: ORAL_TABLET | ORAL | Status: DC

## 2011-07-17 ENCOUNTER — Ambulatory Visit: Payer: Self-pay | Admitting: Family Medicine

## 2011-07-23 ENCOUNTER — Ambulatory Visit: Payer: Self-pay | Admitting: Family Medicine

## 2011-07-25 ENCOUNTER — Encounter: Payer: Self-pay | Admitting: Family Medicine

## 2011-07-25 ENCOUNTER — Ambulatory Visit (INDEPENDENT_AMBULATORY_CARE_PROVIDER_SITE_OTHER): Payer: Self-pay | Admitting: Family Medicine

## 2011-07-25 VITALS — BP 134/77 | HR 69 | Temp 98.2°F | Ht 65.0 in | Wt 216.0 lb

## 2011-07-25 DIAGNOSIS — E0590 Thyrotoxicosis, unspecified without thyrotoxic crisis or storm: Secondary | ICD-10-CM

## 2011-07-25 NOTE — Assessment & Plan Note (Addendum)
Pt with TSH low, unchanged since December/12. No signs of thyrotoxicosis. Long discussion with pt regarding next step in management. Possible Grave's Diseases but we lack of details from pt's prior thyroid evaluation. We will need FreeT4 and T3 and possible 24h radioiodine uptake.   Plan:  We will need prior medical records otherwise we will have to reorder tests in order to diagnose and treat her condition. Pt with financial difficulties

## 2011-07-25 NOTE — Patient Instructions (Addendum)
Hyperthyroidism  The thyroid is a large gland located in the lower front part of your neck. The thyroid helps control metabolism. Metabolism is how your body uses food. It controls metabolism with the hormone thyroxine. When the thyroid is overactive, it produces too much hormone. When this happens, these following problems may occur:     Nervousness    Heat intolerance    Weight loss (in spite of increase food intake)    Diarrhea    Change in hair or skin texture    Palpitations (heart skipping or having extra beats)    Tachycardia (rapid heart rate)    Loss of menstruation (amenorrhea)    Shaking of the hands   CAUSES   Grave's Disease (the immune system attacks the thyroid gland). This is the most common cause.    Inflammation of the thyroid gland.    Tumor (usually benign) in the thyroid gland or elsewhere.    Excessive use of thyroid medications (both prescription and 'natural').    Excessive ingestion of Iodine.   DIAGNOSIS    To prove hyperthyroidism, your caregiver may do blood tests and ultrasound tests. Sometimes the signs are hidden. It may be necessary for your caregiver to watch this illness with blood tests, either before or after diagnosis and treatment.  TREATMENT  Short-term treatment  There are several treatments to control symptoms. Drugs called beta blockers may give some relief. Drugs that decrease hormone production will provide temporary relief in many people. These measures will usually not give permanent relief.  Definitive therapy   There are treatments available which can be discussed between you and your caregiver which will permanently treat the problem. These treatments range from surgery (removal of the thyroid), to the use of radioactive iodine (destroys the thyroid by radiation), to the use of antithyroid drugs (interfere with hormone synthesis). The first two treatments are permanent and usually successful. They most often require hormone replacement therapy for life. This is because it is impossible to remove or destroy the exact amount of thyroid required to make a person euthyroid (normal).  HOME CARE INSTRUCTIONS    See your caregiver if the problems you are being treated for get worse. Examples of this would be the problems listed above.  SEEK MEDICAL CARE IF:  Your general condition worsens.  MAKE SURE YOU:     Understand these instructions.    Will watch your condition.    Will get help right away if you are not doing well or get worse.   Document Released: 01/06/2005 Document Revised: 12/26/2010 Document Reviewed: 05/20/2006  ExitCare Patient Information 2012 [employer].

## 2011-07-25 NOTE — Progress Notes (Signed)
  Subjective:    Patient ID: [name], female    DOB: 1965-08-30, 46 y.o.   MRN: [id number]  HPI Pt that comes today to f/u Hyperthyroidism. Pt has been f/u by Dr. Chestine Spore and does not desire continuing care with his office. No records received from Dr. Ophelia Charter office to this day besides ROI signed by pt. She has been on PTU 150 mg BID. Initially on Methimazole that was change to PTU due to side effects (PTU was also changed from TID to BID). TSH values has been 0.013 in Aug/2012  to 0.008 that has been unchanged since Dec/2012. Pt is compliant with treatment. No signs of thyrotoxicosis reported. Denies palpitations, chest pain, increase in appetite or weight loss. Thyroid U/S performed Dec/2012: showed enlargement and diffused heterogenity of thyroid with no focal lesion  Review of Systems Per HPI    Objective:   Physical Exam  Vitals reviewed. Constitutional: She is oriented to person, place, and time. No distress.  HENT:  Mouth/Throat: Oropharynx is clear and moist.  Eyes: Conjunctivae and EOM are normal. Pupils are equal, round, and reactive to light.  Neck: Normal range of motion. Neck supple. No JVD present. Thyromegaly present.  Cardiovascular: Normal rate, regular rhythm and normal heart sounds.   Pulmonary/Chest: Effort normal and breath sounds normal.  Abdominal: Soft. Bowel sounds are normal. There is no tenderness.  Musculoskeletal: Normal range of motion. She exhibits no edema.  Lymphadenopathy:    She has no cervical adenopathy.  Neurological: She is alert and oriented to person, place, and time. She has normal reflexes.  Skin: Skin is warm and dry.  Psychiatric: She has a normal mood and affect. Her behavior is normal. Judgment and thought content normal.          Assessment & Plan:

## 2011-08-05 ENCOUNTER — Other Ambulatory Visit: Payer: Self-pay | Admitting: Internal Medicine

## 2011-08-05 DIAGNOSIS — E0590 Thyrotoxicosis, unspecified without thyrotoxic crisis or storm: Secondary | ICD-10-CM

## 2011-08-08 ENCOUNTER — Telehealth: Payer: Self-pay | Admitting: *Deleted

## 2011-08-08 NOTE — Telephone Encounter (Signed)
Called and LVM asking pt to call back concerning her Endocrinology referral whether or not she would be able to pay out of pocket.  Terese Door had informed pt that there were no Drs that accept the orange card for these services and that we could send her somewhere else where she would be responsible for paying for the visit(s) she stated that she would speak with her husband about this and let us know what she wanted to do.Laureen Ochs, Viann Shove

## 2011-08-11 ENCOUNTER — Encounter (HOSPITAL_COMMUNITY)
Admission: RE | Admit: 2011-08-11 | Discharge: 2011-08-11 | Disposition: A | Discharge status: Home / Self Care | Payer: Self-pay | Source: Ambulatory Visit | Attending: Internal Medicine | Admitting: Internal Medicine

## 2011-08-11 ENCOUNTER — Telehealth: Payer: Self-pay | Admitting: *Deleted

## 2011-08-11 DIAGNOSIS — E0590 Thyrotoxicosis, unspecified without thyrotoxic crisis or storm: Secondary | ICD-10-CM | POA: Insufficient documentation

## 2011-08-11 NOTE — Telephone Encounter (Signed)
Error

## 2011-08-12 ENCOUNTER — Encounter (HOSPITAL_COMMUNITY)
Admission: RE | Admit: 2011-08-12 | Discharge: 2011-08-12 | Disposition: A | Discharge status: Home / Self Care | Payer: Self-pay | Source: Ambulatory Visit | Attending: Internal Medicine | Admitting: Internal Medicine

## 2011-08-12 MED ORDER — SODIUM PERTECHNETATE TC 99M INJECTION
11.0000 | Freq: Once | INTRAVENOUS | Status: AC | PRN
  Administered 2011-08-12: 11 via INTRAVENOUS

## 2011-08-12 MED ORDER — SODIUM IODIDE I 131 CAPSULE
10.9000 | Freq: Once | INTRAVENOUS | Status: AC | PRN
  Administered 2011-08-11: 10.9 via ORAL

## 2011-08-14 ENCOUNTER — Telehealth: Payer: Self-pay | Admitting: *Deleted

## 2011-08-14 NOTE — Telephone Encounter (Signed)
Pt has been called and I have left several VM asking her to call back. I will cancel this referral due to pt not calling back to assist with referral.[name] Lynford Humphrey, CMA

## 2011-08-18 NOTE — Telephone Encounter (Signed)
Error

## 2011-08-20 ENCOUNTER — Other Ambulatory Visit: Payer: Self-pay | Admitting: Internal Medicine

## 2011-08-20 DIAGNOSIS — E0500 Thyrotoxicosis with diffuse goiter without thyrotoxic crisis or storm: Secondary | ICD-10-CM

## 2011-09-02 ENCOUNTER — Encounter (HOSPITAL_COMMUNITY)
Admission: RE | Admit: 2011-09-02 | Discharge: 2011-09-02 | Disposition: A | Discharge status: Home / Self Care | Payer: Self-pay | Source: Ambulatory Visit | Attending: Internal Medicine | Admitting: Internal Medicine

## 2011-09-02 DIAGNOSIS — E0500 Thyrotoxicosis with diffuse goiter without thyrotoxic crisis or storm: Secondary | ICD-10-CM | POA: Insufficient documentation

## 2011-09-02 MED ORDER — SODIUM IODIDE I 131 CAPSULE
15.2000 | Freq: Once | INTRAVENOUS | Status: AC | PRN
  Administered 2011-09-02: 15.2 via ORAL

## 2011-10-28 ENCOUNTER — Other Ambulatory Visit: Payer: Self-pay | Admitting: Family Medicine

## 2011-11-15 ENCOUNTER — Other Ambulatory Visit: Payer: Self-pay | Admitting: Cardiovascular Disease

## 2011-11-22 ENCOUNTER — Other Ambulatory Visit: Payer: Self-pay | Admitting: Family Medicine

## 2011-12-03 ENCOUNTER — Other Ambulatory Visit: Payer: Self-pay | Admitting: Cardiovascular Disease

## 2011-12-04 NOTE — Telephone Encounter (Signed)
Pt needs appointment then refill can be made Fax Received. Refill Completed. [name] (R.M.A)   

## 2011-12-10 ENCOUNTER — Other Ambulatory Visit: Payer: Self-pay | Admitting: Cardiovascular Disease

## 2011-12-12 ENCOUNTER — Other Ambulatory Visit: Payer: Self-pay | Admitting: Cardiovascular Disease

## 2011-12-14 ENCOUNTER — Other Ambulatory Visit: Payer: Self-pay | Admitting: Cardiovascular Disease

## 2011-12-31 ENCOUNTER — Other Ambulatory Visit: Payer: Self-pay | Admitting: Cardiovascular Disease

## 2012-01-08 ENCOUNTER — Other Ambulatory Visit: Payer: Self-pay | Admitting: Family Medicine

## 2012-01-08 ENCOUNTER — Telehealth: Payer: Self-pay | Admitting: Family Medicine

## 2012-01-08 MED ORDER — AMLODIPINE BESYLATE 5 MG PO TABS: 5.0000 mg | ORAL_TABLET | Freq: Every day | ORAL | Status: DC

## 2012-01-08 NOTE — Telephone Encounter (Signed)
Prescription sent to pharmacy.

## 2012-01-08 NOTE — Telephone Encounter (Signed)
Need refill for her Amlodipine.  Pharmacy need rx from [place] since the rx was orginally written by Dr. Lind Guest with [facility].  They want her primary to dispense rx for this now.

## 2012-01-08 NOTE — Telephone Encounter (Signed)
Called and informed her that rx was sent in. She asked and was informed that it was sent to the [facility] on [address]

## 2012-01-21 ENCOUNTER — Other Ambulatory Visit: Payer: Self-pay | Admitting: Family Medicine

## 2012-03-19 ENCOUNTER — Other Ambulatory Visit: Payer: Self-pay | Admitting: Family Medicine

## 2012-03-19 MED ORDER — FERROUS SULFATE 325 (65 FE) MG PO TABS: 325.0000 mg | ORAL_TABLET | Freq: Every day | ORAL | Status: DC

## 2012-05-24 ENCOUNTER — Telehealth: Payer: Self-pay | Admitting: Family Medicine

## 2012-05-24 NOTE — Telephone Encounter (Signed)
Will forward to Dr Tommi Rumps [facility]

## 2012-05-24 NOTE — Telephone Encounter (Signed)
Will route to her PCP. 

## 2012-05-24 NOTE — Telephone Encounter (Signed)
Pt going out of country next [place] due to illness in her family.  Need to have refill on her metoprolol and her meloxicam

## 2012-05-28 ENCOUNTER — Other Ambulatory Visit: Payer: Self-pay | Admitting: Family Medicine

## 2012-05-28 MED ORDER — MELOXICAM 7.5 MG PO TABS: ORAL_TABLET | ORAL | Status: DC

## 2012-07-07 ENCOUNTER — Encounter: Payer: Self-pay | Admitting: Family Medicine

## 2012-07-07 ENCOUNTER — Ambulatory Visit (INDEPENDENT_AMBULATORY_CARE_PROVIDER_SITE_OTHER): Payer: Self-pay | Admitting: Family Medicine

## 2012-07-07 VITALS — BP 138/69 | HR 85 | Temp 98.9°F | Ht 65.0 in | Wt 227.8 lb

## 2012-07-07 DIAGNOSIS — N939 Abnormal uterine and vaginal bleeding, unspecified: Secondary | ICD-10-CM

## 2012-07-07 DIAGNOSIS — N938 Other specified abnormal uterine and vaginal bleeding: Secondary | ICD-10-CM

## 2012-07-07 DIAGNOSIS — I10 Essential (primary) hypertension: Secondary | ICD-10-CM

## 2012-07-07 DIAGNOSIS — E0590 Thyrotoxicosis, unspecified without thyrotoxic crisis or storm: Secondary | ICD-10-CM

## 2012-07-07 DIAGNOSIS — N898 Other specified noninflammatory disorders of vagina: Secondary | ICD-10-CM

## 2012-07-07 DIAGNOSIS — N949 Unspecified condition associated with female genital organs and menstrual cycle: Secondary | ICD-10-CM

## 2012-07-07 MED ORDER — METOPROLOL TARTRATE 50 MG PO TABS: 50.0000 mg | ORAL_TABLET | Freq: Two times a day (BID) | ORAL | Status: DC

## 2012-07-07 NOTE — Progress Notes (Signed)
Family Medicine Office Visit Note   Subjective:   Patient ID: [name], female  DOB: 1965/06/10, 47 y.o.. MRN: [id number]   Pt that comes today complaining of heavy vaginal bleeding. She had similar symptoms a year ago that resolved with progesterone treatment. Pt also had a pelvic ultrasound at that time (March/2013) that showed small fibroids with mass effect upon endometrium and endometrium of 1.4cm. She describes now having her periods 2 times in a month that usually last for 5 days, being the first 2 days very heavy (uses tampons and maxipads every 1 hour. Last two menses were 5/19 ad 6/12. She does not have her menses today. She also has hyperthyroidism followed up by Dr. Chestine Spore with recent Iodine ablation. Diastolic CHF: f/u by  Cards with yearly evaluation. Denies LE edema, exertional dyspnea or orthopnea. Reports compliance with medications.  Review of Systems:  Pt denies SOB, chest pain, palpitations, headaches, dizziness, numbness or weakness. No changes on urinary or BM habits. No unintentional weigh loss.  Objective:   Physical Exam: Gen:  NAD HEENT: Moist and normal color mucous membranes.  Thyromegaly present. CV: Regular rate and rhythm, no murmurs rubs or gallops PULM: Clear to auscultation bilaterally. No wheezes/rales/rhonchi ABD: Soft, non tender, non distended, normal bowel sounds EXT: No edema Neuro: Alert and oriented x3. No focalization Vaginal exam: declined at this visit.  Assessment & Plan:

## 2012-07-07 NOTE — Assessment & Plan Note (Signed)
Controlled. No side effects of medication noted.  P/ No changes on her current regimen. Lipid profile, bmet.

## 2012-07-07 NOTE — Patient Instructions (Addendum)
It has been a pleasure to see you today. Please continue taking the medications as prescribed. I have handed you the paper prescription for metoprolol.  I will call you with the labs results if they come back abnormal otherwise we will discuss them at your next appointment. You will need to schedule an appointment for endometrial biopsy and pap smear after results are back and wnl for procedure.

## 2012-07-07 NOTE — Assessment & Plan Note (Addendum)
Due to frequency most likely ovulatory dysfunction. Pt has multiple conditions that can explain her bleeding, nevertheless it is important to r/o with her age the possibility of malignancy. Last pap smear Aug/2012 was negative. Pt has had also hemoptysis in th past with no apparent cause, she also has hyperthyroidism with recent thyroid ablation. She has been diagnosed with uterine fibroids affecting the endometrium and uses significant amount of NSAIDs to relieve her arthritic knee pain. Plan: Stop Mobic CBC, PT/PTT and bleeding time. TSH, BMET, FS/LH progesterone and prolactin Will set her up for endometrial biopsy upon labs results and will repeat pap smear  Continue Iron supplementation Pt declined OBGYN referral at this time, she would like to get all this work up done first.

## 2012-07-08 ENCOUNTER — Other Ambulatory Visit: Payer: Self-pay

## 2012-07-08 DIAGNOSIS — I10 Essential (primary) hypertension: Secondary | ICD-10-CM

## 2012-07-08 DIAGNOSIS — N939 Abnormal uterine and vaginal bleeding, unspecified: Secondary | ICD-10-CM

## 2012-07-08 DIAGNOSIS — E0590 Thyrotoxicosis, unspecified without thyrotoxic crisis or storm: Secondary | ICD-10-CM

## 2012-07-08 LAB — LIPID PANEL
LDL Cholesterol: 136 mg/dL — ABNORMAL HIGH (ref 0–99)
Total CHOL/HDL Ratio: 4.5 Ratio
VLDL: 17 mg/dL (ref 0–40)

## 2012-07-08 LAB — IRON AND TIBC
%SAT: 16 % — ABNORMAL LOW (ref 20–55)
TIBC: 312 ug/dL (ref 250–470)

## 2012-07-08 LAB — BASIC METABOLIC PANEL
CO2: 27 mEq/L (ref 19–32)
Calcium: 9 mg/dL (ref 8.4–10.5)
Chloride: 106 mEq/L (ref 96–112)
Sodium: 140 mEq/L (ref 135–145)

## 2012-07-08 LAB — CBC
MCH: 25.9 pg — ABNORMAL LOW (ref 26.0–34.0)
Platelets: 286 10*3/uL (ref 150–400)
RBC: 4.32 MIL/uL (ref 3.87–5.11)
WBC: 4.6 10*3/uL (ref 4.0–10.5)

## 2012-07-08 LAB — FSH/LH: LH: 12.8 m[IU]/mL

## 2012-07-08 NOTE — Progress Notes (Signed)
LABS DONE TODAY Jaleigha Deane 

## 2012-07-22 ENCOUNTER — Encounter: Payer: Self-pay | Admitting: Family Medicine

## 2012-07-22 ENCOUNTER — Ambulatory Visit (INDEPENDENT_AMBULATORY_CARE_PROVIDER_SITE_OTHER): Payer: Self-pay | Admitting: Family Medicine

## 2012-07-22 VITALS — BP 133/75 | HR 61 | Wt 227.4 lb

## 2012-07-22 DIAGNOSIS — N938 Other specified abnormal uterine and vaginal bleeding: Secondary | ICD-10-CM

## 2012-07-22 DIAGNOSIS — N949 Unspecified condition associated with female genital organs and menstrual cycle: Secondary | ICD-10-CM

## 2012-07-22 DIAGNOSIS — N912 Amenorrhea, unspecified: Secondary | ICD-10-CM

## 2012-07-22 MED ORDER — MELOXICAM 7.5 MG PO TABS: 7.5000 mg | ORAL_TABLET | Freq: Every day | ORAL | Status: DC

## 2012-07-22 NOTE — Progress Notes (Signed)
Endometrial Biopsy Procedure Note  Pre-operative Diagnosis: dysfunctional uterine bleeding.  Post-operative Diagnosis: same  Indications: abnormal uterine bleeding  Procedure Details   Urine pregnancy test was done today and result was negative.  The risks (including infection, bleeding, pain, and uterine perforation) and benefits of the procedure were explained to the patient and Written informed consent was obtained.  Antibiotic prophylaxis against endocarditis was not indicated.   The patient was placed in the dorsal lithotomy position.  Bimanual exam showed the uterus to be in the anteroflexed position.  A Graves' speculum inserted in the vagina, and the cervix prepped with povidone iodine.   A sharp tenaculum was applied to the anterior lip of the cervix for stabilization.  A sterile uterine sound was used to sound the uterus to a depth of 6.5cm.  A Pipelle endometrial aspirator was used to sample the endometrium.  Sample was sent for pathologic examination.  Condition: Stable  Complications: None  Plan:  The patient was advised to call for any fever or for prolonged or severe pain or bleeding. She was advised to use NSAID as needed for mild to moderate pain. She was advised to avoid vaginal intercourse for 48 hours or until the bleeding has completely stopped.  Attending Physician Documentation: Procedure was in the presence and close supervision of Dr. Denny Levy, MD and Dr. Lum Babe, MD.

## 2012-07-22 NOTE — Patient Instructions (Addendum)
We have done an Endometrial Biopsy today. You may experience mild pelvic discomfort but not pain.  Please avoid tampons or sexual intercourse for 2 weeks. If you have any of the following get evaluated right away: - intense vaginal bleeding - pelvic pain not resolved with ibuprofen - fever or chills - malodorous vaginal discharge. - please make an appointment in 2-3 weeks to discuss results. If they come back abnormal to the point of discussing them sooner I will call you.

## 2012-08-11 ENCOUNTER — Ambulatory Visit (INDEPENDENT_AMBULATORY_CARE_PROVIDER_SITE_OTHER): Payer: Self-pay | Admitting: Family Medicine

## 2012-08-11 ENCOUNTER — Encounter: Payer: Self-pay | Admitting: Family Medicine

## 2012-08-11 VITALS — BP 125/64 | HR 62 | Temp 98.2°F | Ht 65.0 in | Wt 227.0 lb

## 2012-08-11 DIAGNOSIS — D649 Anemia, unspecified: Secondary | ICD-10-CM

## 2012-08-11 DIAGNOSIS — N938 Other specified abnormal uterine and vaginal bleeding: Secondary | ICD-10-CM

## 2012-08-11 DIAGNOSIS — N949 Unspecified condition associated with female genital organs and menstrual cycle: Secondary | ICD-10-CM

## 2012-08-11 LAB — POCT HEMOGLOBIN: Hemoglobin: 10.2 g/dL — AB (ref 12.2–16.2)

## 2012-08-11 NOTE — Progress Notes (Signed)
Family Medicine Office Visit Note   Subjective:   Patient ID: [name], female  DOB: [birth year], 47 y.o.. MRN: [id number]   Pt that comes today to discuss recent test results. Pt had Hx of DUB with a U/S last year for thickened  endometrial stripe and fibroids. She went recently through endometrial biopsy with good sampling which was reported as secretory endometrium negative for hyperplasia or carcinoma. Pt also has had blood test checking for Prolactin, FSH/LH, TSH and testosterone. They were wnl except FSH in menopausal range.  Significant Hx also for hyperthyroidism s/p thyroid ablation.  Pt had recent menses with significant bleeding. She denies symptoms of acute anemia, but she still concerns about her symptoms.  Review of Systems:  Pt denies SOB, chest pain, palpitations, headaches, dizziness, numbness or weakness. No changes on urinary or BM habits. No unintentional weigh loss/gain.  Objective:   Physical Exam: Gen:  NAD HEENT: Moist mucous membranes  CV: Regular rate and rhythm, no murmurs rubs or gallops PULM: Clear to auscultation bilaterally. No wheezes/rales/rhonchy ABD: Soft, non tender, non distended, normal bowel sounds EXT: No edema Neuro: Alert and oriented x3. No focalization  Assessment & Plan:

## 2012-08-11 NOTE — Assessment & Plan Note (Signed)
Discussed in depth with patient the above results. Since pt is still symptomatic we recommended GYN consult to evaluate for surgical options. Pt will think about this and will let us know her final decision. Hb today is 10.2 a gram since her last Hb of 11.2. Pt is currently asymptomatic from her anemia. Recommended Iron replacement TID with vit C and stool softeners.

## 2012-08-11 NOTE — Patient Instructions (Addendum)
It has been a pleasure to see you today. Please take the medications as prescribed. Iron tab TID with Vit C. You may benefit from Miralax since this may give you constipation. We recommend GYN evaluation if symptoms persist. Let us know your decision so we can place proper referral. Make your next appointment in 3-4 months or sooner if needed.

## 2012-11-07 ENCOUNTER — Other Ambulatory Visit: Payer: Self-pay | Admitting: Cardiovascular Disease

## 2012-11-12 ENCOUNTER — Telehealth: Payer: Self-pay | Admitting: Family Medicine

## 2012-11-12 NOTE — Telephone Encounter (Signed)
Pt needs refill of her floresimide (sp?) [facility]

## 2012-11-16 ENCOUNTER — Telehealth: Payer: Self-pay | Admitting: Family Medicine

## 2012-11-16 NOTE — Telephone Encounter (Signed)
foresimide

## 2012-11-16 NOTE — Telephone Encounter (Signed)
Pt called and wanted to check on the status of her refill request. [name]

## 2012-11-18 ENCOUNTER — Other Ambulatory Visit: Payer: Self-pay | Admitting: Family Medicine

## 2012-11-18 DIAGNOSIS — I10 Essential (primary) hypertension: Secondary | ICD-10-CM

## 2012-11-18 MED ORDER — FUROSEMIDE 20 MG PO TABS: 20.0000 mg | ORAL_TABLET | Freq: Every day | ORAL | Status: DC

## 2012-11-18 NOTE — Telephone Encounter (Signed)
Spoke with patient and informed her of below 

## 2012-11-18 NOTE — Telephone Encounter (Signed)
Furosemide refilled to [facility] in [address]

## 2012-11-29 ENCOUNTER — Telehealth: Payer: Self-pay | Admitting: Family Medicine

## 2012-11-29 NOTE — Telephone Encounter (Signed)
Pt requesting a rx for her ibuprophen.  The meloxicam is causing her cramps and jitteryness.  Please call patient back to inform when ready.

## 2012-11-30 NOTE — Telephone Encounter (Signed)
Spoke with patient and informed her of below 

## 2012-11-30 NOTE — Telephone Encounter (Signed)
Pt can take ibuprofen over the counter. She does not need prescription for this.

## 2012-12-20 ENCOUNTER — Encounter: Payer: Self-pay | Admitting: Family Medicine

## 2012-12-20 ENCOUNTER — Telehealth: Payer: Self-pay | Admitting: *Deleted

## 2012-12-20 ENCOUNTER — Ambulatory Visit (INDEPENDENT_AMBULATORY_CARE_PROVIDER_SITE_OTHER): Payer: Self-pay | Admitting: Family Medicine

## 2012-12-20 VITALS — BP 136/85 | HR 73 | Ht 65.0 in | Wt 225.0 lb

## 2012-12-20 DIAGNOSIS — M25561 Pain in right knee: Secondary | ICD-10-CM

## 2012-12-20 DIAGNOSIS — M25569 Pain in unspecified knee: Secondary | ICD-10-CM

## 2012-12-20 MED ORDER — MELOXICAM 7.5 MG PO TABS: 7.5000 mg | ORAL_TABLET | Freq: Every day | ORAL | Status: DC

## 2012-12-20 NOTE — Telephone Encounter (Signed)
Patient called and needs a refill on the Meloxicam 7.5 mg. Still to take 1 tablet by mouth daily. #30 with 1 refill?

## 2012-12-20 NOTE — Patient Instructions (Signed)
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 You were given a cortisone shot today - if by a week from now you're not improving give me a call and MRI would be the next step. Can continue home exercises daily. Meloxicam or advil is ok to continue to take even with the shot. Ice your knee 15 minutes at a time 3-4 times a day. Elevate above the level of your heart, use ace wrap as needed for swelling. If doing well, follow up with me in 1 month for a recheck.

## 2012-12-20 NOTE — Telephone Encounter (Signed)
That is fine. Thanks 

## 2012-12-21 ENCOUNTER — Encounter: Payer: Self-pay | Admitting: Family Medicine

## 2012-12-21 NOTE — Assessment & Plan Note (Addendum)
Prior radiographs showed minimal evidence of DJD.  History and exam consistent with degenerative meniscal tear.  Went ahead with injection today.  NSAIDs, icing.  MRI if not improving as expected.  Continue HEP.  F/u in 1 month.  After informed written consent, patient was seated on exam table. Right knee was prepped with alcohol swab and utilizing anteromedial approach, patient's right knee was injected intraarticularly with 3:1 marcaine: depomedrol. Patient tolerated the procedure well without immediate complications.

## 2012-12-21 NOTE — Progress Notes (Signed)
Patient ID: [name], female   DOB: Feb 15, 1965, 47 y.o.   MRN: [id number]  Subjective:    Patient ID: [name], female    DOB: 30-May-1965, 47 y.o.   MRN: [id number]  PCP: Dr. Aviva Signs  Knee Pain    46 yo F here for right knee pain.  04/25/11: Patient reports having had intermittent right knee pain for 9 months but more consistent over past 1 month. Denies known trauma or injury. Associated with swelling, popping noises when standing up. Pain is deep anterior pain. Worse with stairs. Just started meloxicam past few days but hasn't noted much difference to date. No locking, catching, giving out.  12/20/12: Patient reports she has been dealing with the pain dating back to last visit. Primarily medial now. No new injuries or trauma. Has been icing, tried advil and meloxicam. No catching, locking, giving out. Still worse with stairs. Doing home exercises.  Past Medical History  Diagnosis Date  . Hypertension     DX SEVERAL DECADES YRS AGO- UNTRREATED UNTIL ADMISSION 02/2010  . Diastolic heart failure 02/2010    NEW ON ECHO 02/2010  . Hemoptysis 02/2010    DUE TO ALV HGE.Marland KitchenMarland KitchenANA NEGATIVE...DEEMED DUE TO DIASTOLIC CHF  . PPD positive     NEGATIVE DURING CHILDHOOD IN [facility]1ST + TEST 1993 DURING IMMIGRATION GRREN CARD CLEARENCE.Marland KitchenCXR NORMAL 1993 PER HX..s/p INR Rx AT [facility].Marland KitchenREPEAT + 02/2010  . Cough due to angiotensin-converting enzyme inhibitor 05/27/2010  . Hyperthyroidism     Current Outpatient Prescriptions on File Prior to Visit  Medication Sig Dispense Refill  . amLODipine (NORVASC) 5 MG tablet Take 1 tablet (5 mg total) by mouth daily.  30 tablet  11  . docusate sodium (COLACE) 100 MG capsule 1-2 po daily as needed for constipation.  100 capsule  0  . ferrous sulfate 325 (65 FE) MG tablet Take 1 tablet (325 mg total) by mouth daily with breakfast.  30 tablet  11  . furosemide (LASIX) 20 MG tablet Take 1 tablet (20 mg total) by mouth daily.  30  tablet  13  . metoprolol (LOPRESSOR) 50 MG tablet Take 1 tablet (50 mg total) by mouth 2 (two) times daily.  60 tablet  11  . propylthiouracil (PTU) 50 MG tablet Take 150 mg by mouth 3 (three) times daily.         No current facility-administered medications on file prior to visit.    Past Surgical History  Procedure Laterality Date  . Cesarean section      No Known Allergies  History   Social History  . Marital Status: Single    Spouse Name: N/A    Number of Children: N/A  . Years of Education: N/A   Occupational History  . Not on file.   Social History Main Topics  . Smoking status: Never Smoker   . Smokeless tobacco: Never Used  . Alcohol Use: Yes  . Drug Use: No  . Sexual Activity: Yes    Birth Control/ Protection: Surgical   Other Topics Concern  . Not on file   Social History Narrative  . No narrative on file    Family History  Problem Relation Age of Onset  . Hypertension Father   . Sudden death Father   . Hyperlipidemia Neg Hx   . Heart attack Neg Hx   . Diabetes Neg Hx     BP 136/85  Pulse 73  Ht 5\' 5"  (1.651 m)  Wt 225 lb (  102.059 kg)  BMI 37.44 kg/m2  LMP 07/15/2011   Review of Systems See hpi above.    Objective:   Physical Exam Gen: NAD  R knee: No gross deformity, ecchymoses, swelling. Mod TTP medial joint line.  No lateral joint line, post patellar facet, or other TTP about knee. FROM. Negative ant/post drawers. Negative valgus/varus testing. Negative lachmanns. + medial mcmurrays, apleys.  Negative patellar apprehension, clarkes. NV intact distally.  L knee: FROM without pain, instability.    Assessment & Plan:  1. Right knee pain - Prior radiographs showed minimal evidence of DJD.  History and exam consistent with degenerative meniscal tear.  Went ahead with injection today.  NSAIDs, icing.  MRI if not improving as expected.  Continue HEP.  F/u in 1 month.  After informed written consent, patient was seated on exam table.  Right knee was prepped with alcohol swab and utilizing anteromedial approach, patient's right knee was injected intraarticularly with 3:1 marcaine: depomedrol. Patient tolerated the procedure well without immediate complications.

## 2013-01-28 ENCOUNTER — Ambulatory Visit (INDEPENDENT_AMBULATORY_CARE_PROVIDER_SITE_OTHER): Payer: No Typology Code available for payment source | Admitting: Family Medicine

## 2013-01-28 ENCOUNTER — Encounter: Payer: Self-pay | Admitting: Family Medicine

## 2013-01-28 VITALS — BP 134/79 | HR 78 | Temp 99.3°F | Wt 231.0 lb

## 2013-01-28 DIAGNOSIS — E669 Obesity, unspecified: Secondary | ICD-10-CM

## 2013-01-28 DIAGNOSIS — E0590 Thyrotoxicosis, unspecified without thyrotoxic crisis or storm: Secondary | ICD-10-CM

## 2013-01-28 DIAGNOSIS — Z0000 Encounter for general adult medical examination without abnormal findings: Secondary | ICD-10-CM

## 2013-01-28 NOTE — Patient Instructions (Signed)
It has been a pleasure to see you today. Please take the medications as prescribed. I will call you with the labs results if they come back abnormal otherwise you will receive a letter. I have refer you to a nutritionist, please call her to the telephone provided in order to make the appointment.  This is an example diet you can start: DASH Diet The DASH diet stands for "Dietary Approaches to Stop Hypertension." It is a healthy eating plan that has been shown to reduce high blood pressure (hypertension) in as little as 14 days, while also possibly providing other significant health benefits. These other health benefits include reducing the risk of breast cancer after menopause and reducing the risk of type 2 diabetes, heart disease, colon cancer, and stroke. Health benefits also include weight loss and slowing kidney failure in patients with chronic kidney disease.  DIET GUIDELINES  Limit salt (sodium). Your diet should contain less than 1500 mg of sodium daily.  Limit refined or processed carbohydrates. Your diet should include mostly whole grains. Desserts and added sugars should be used sparingly.  Include small amounts of heart-healthy fats. These types of fats include nuts, oils, and tub margarine. Limit saturated and trans fats. These fats have been shown to be harmful in the body. CHOOSING FOODS  The following food groups are based on a 2000 calorie diet. See your Registered Dietitian for individual calorie needs. Grains and Grain Products (6 to 8 servings daily)  Eat More Often: Whole-wheat bread, brown rice, whole-grain or wheat pasta, quinoa, popcorn without added fat or salt (air popped).  Eat Less Often: White bread, white pasta, white rice, cornbread. Vegetables (4 to 5 servings daily)  Eat More Often: Fresh, frozen, and canned vegetables. Vegetables may be raw, steamed, roasted, or grilled with a minimal amount of fat.  Eat Less Often/Avoid: Creamed or fried vegetables.  Vegetables in a cheese sauce. Fruit (4 to 5 servings daily)  Eat More Often: All fresh, canned (in natural juice), or frozen fruits. Dried fruits without added sugar. One hundred percent fruit juice ( cup [237 mL] daily).  Eat Less Often: Dried fruits with added sugar. Canned fruit in light or heavy syrup. [employer], Fish, and Poultry (2 servings or less daily. One serving is 3 to 4 oz [85-114 g]).  Eat More Often: Ninety percent or leaner ground beef, tenderloin, sirloin. Round cuts of beef, chicken breast, [place] breast. All fish. Grill, bake, or broil your meat. Nothing should be fried.  Eat Less Often/Avoid: Fatty cuts of meat, [place], or chicken leg, thigh, or wing. Fried cuts of meat or fish. Dairy (2 to 3 servings)  Eat More Often: Low-fat or fat-free milk, low-fat plain or light yogurt, reduced-fat or part-skim cheese.  Eat Less Often/Avoid: Milk (whole, 2%).Whole milk yogurt. Full-fat cheeses. Nuts, Seeds, and Legumes (4 to 5 servings per week)  Eat More Often: All without added salt.  Eat Less Often/Avoid: Salted nuts and seeds, canned beans with added salt. Fats and Sweets (limited)  Eat More Often: Vegetable oils, tub margarines without trans fats, sugar-free gelatin. Mayonnaise and salad dressings.  Eat Less Often/Avoid: Coconut oils, palm oils, butter, stick margarine, cream, half and half, cookies, candy, pie. FOR MORE INFORMATION The Dash Diet Eating Plan: www.dashdiet.org

## 2013-01-28 NOTE — Assessment & Plan Note (Signed)
Pap smear up to date. No hx of abnormal.  Next due in 2017.  Mammography due. Instructions and contact info given in order for her to schedule it. No much time during visit to go over her typical day of food intake but reported irregularities in portion control and CHO intake.  Labs ordered to f/u her hyperthyroidism and basic metabolic profile. Nutrition consult recommended. DASH diet sample provided.  F/u in 2 months.

## 2013-01-28 NOTE — Progress Notes (Signed)
Family Medicine Office Visit Note   Subjective:   Patient ID: [name], female  DOB: [birth year], 48 y.o.. MRN: [id number]   Pt that comes today for annual visit. Her complaint today is her weight. She reports 6 lb increase since her last visit a month ago. She reports her appetite is the same and does no recall eating different during this time. She has not been able to exercise due  To her pain on her knees and is concerned about the possible metabolic causes of this weight gain. She does report issues with portion control and no limiting amount of pastas, bread, rice and sweats that she likes.   She reports compliance with her medication and since she missed her endocrinology appointment last month she has her next one scheduled for next week.   Her mammography was done 2.5 years ago and was reported negative at that time.  Last pap smear was 6 months ago and reported negative for intraepithelial lesions or malignancy.   Review of Systems:  Pt denies SOB, chest pain, palpitations, headaches, dizziness, numbness or weakness. No changes on urinary or BM habits.   Objective:   Physical Exam: Gen:  NAD HEENT: Moist mucous membranes  CV: Regular rate and rhythm, no murmurs rubs or gallops PULM: Clear to auscultation bilaterally. No wheezes/rales/rhonchi ABD: Soft, non tender, non distended, normal bowel sounds EXT: No edema Neuro: Alert and oriented x3. No focalization  Assessment & Plan:

## 2013-01-31 ENCOUNTER — Other Ambulatory Visit: Payer: No Typology Code available for payment source

## 2013-01-31 DIAGNOSIS — E0590 Thyrotoxicosis, unspecified without thyrotoxic crisis or storm: Secondary | ICD-10-CM

## 2013-01-31 LAB — BASIC METABOLIC PANEL
BUN: 12 mg/dL (ref 6–23)
CALCIUM: 8.9 mg/dL (ref 8.4–10.5)
CO2: 27 mEq/L (ref 19–32)
CREATININE: 0.84 mg/dL (ref 0.50–1.10)
Chloride: 106 mEq/L (ref 96–112)
Glucose, Bld: 94 mg/dL (ref 70–99)
Potassium: 4 mEq/L (ref 3.5–5.3)
SODIUM: 139 meq/L (ref 135–145)

## 2013-01-31 LAB — CBC
HCT: 35.2 % — ABNORMAL LOW (ref 36.0–46.0)
Hemoglobin: 11.2 g/dL — ABNORMAL LOW (ref 12.0–15.0)
MCH: 26.4 pg (ref 26.0–34.0)
MCHC: 31.8 g/dL (ref 30.0–36.0)
MCV: 82.8 fL (ref 78.0–100.0)
PLATELETS: 263 10*3/uL (ref 150–400)
RBC: 4.25 MIL/uL (ref 3.87–5.11)
RDW: 15.1 % (ref 11.5–15.5)
WBC: 5.2 10*3/uL (ref 4.0–10.5)

## 2013-01-31 LAB — TSH: TSH: 2.355 u[IU]/mL (ref 0.350–4.500)

## 2013-01-31 NOTE — Progress Notes (Signed)
BMP,CBC AND TSH DONE TODAY [name]

## 2013-02-01 ENCOUNTER — Ambulatory Visit: Payer: No Typology Code available for payment source | Admitting: Family Medicine

## 2013-02-04 ENCOUNTER — Other Ambulatory Visit: Payer: Self-pay | Admitting: Family Medicine

## 2013-02-04 ENCOUNTER — Telehealth: Payer: Self-pay | Admitting: Family Medicine

## 2013-02-04 DIAGNOSIS — N939 Abnormal uterine and vaginal bleeding, unspecified: Secondary | ICD-10-CM

## 2013-02-04 NOTE — Telephone Encounter (Signed)
[name] want you to see if you can get her referred to a gynecologist regarding the heavy bleeding she's still having.  Please call her asap when completed.

## 2013-02-04 NOTE — Telephone Encounter (Signed)
Referral placed. Thank you

## 2013-02-09 ENCOUNTER — Encounter: Payer: Self-pay | Admitting: Family Medicine

## 2013-02-09 ENCOUNTER — Ambulatory Visit (INDEPENDENT_AMBULATORY_CARE_PROVIDER_SITE_OTHER): Payer: No Typology Code available for payment source | Admitting: Family Medicine

## 2013-02-09 VITALS — BP 126/81 | HR 59 | Ht 65.0 in | Wt 226.0 lb

## 2013-02-09 DIAGNOSIS — M25561 Pain in right knee: Secondary | ICD-10-CM

## 2013-02-09 DIAGNOSIS — M25569 Pain in unspecified knee: Secondary | ICD-10-CM

## 2013-02-09 NOTE — Patient Instructions (Addendum)
Continue home exercises with meloxicam (OR aleve) as needed. Can consider imaging, repeating physical therapy, repeating injection if not improving. Ice your knee 15 minutes at a time 3-4 times a day as needed. Elevate above the level of your heart, use ace wrap as needed for swelling. Follow up as needed.

## 2013-02-10 ENCOUNTER — Encounter: Payer: Self-pay | Admitting: Family Medicine

## 2013-02-10 NOTE — Assessment & Plan Note (Signed)
Prior radiographs showed minimal evidence of DJD.  History and exam consistent with degenerative meniscal tear.  Improved with injection, mobic.  Continue current treatment.  F/u prn.

## 2013-02-10 NOTE — Progress Notes (Signed)
Patient ID: [name], female   DOB: 06-24-1965, 48 y.o.   MRN: [id number]  Subjective:    Patient ID: [name], female    DOB: February 11, 1965, 48 y.o.   MRN: [id number]  PCP: Dr. Thomes Dinning  Wound Check  Knee Pain    48 yo F here for right knee pain.  04/25/11: Patient reports having had intermittent right knee pain for 9 months but more consistent over past 1 month. Denies known trauma or injury. Associated with swelling, popping noises when standing up. Pain is deep anterior pain. Worse with stairs. Just started meloxicam past few days but hasn't noted much difference to date. No locking, catching, giving out.  12/20/12: Patient reports she has been dealing with the pain dating back to last visit. Primarily medial now. No new injuries or trauma. Has been icing, tried advil and meloxicam. No catching, locking, giving out. Still worse with stairs. Doing home exercises.  02/09/13: Patient reports she feels about 80% improved from last visit. Pain still a 4/10 though. Taking mobic occasionally. No catching, locking, giving out. Still bothers with stairs. Injection did help her.  Past Medical History  Diagnosis Date  . Hypertension     DX SEVERAL DECADES YRS AGO- UNTRREATED UNTIL ADMISSION 02/2010  . Diastolic heart failure 0/6237    NEW ON ECHO 02/2010  . Hemoptysis 02/2010    DUE TO ALV HGE.Marland KitchenMarland KitchenANA NEGATIVE...DEEMED DUE TO DIASTOLIC CHF  . PPD positive     NEGATIVE DURING CHILDHOOD IN [facility]..1ST + TEST 1993 DURING IMMIGRATION GRREN CARD CLEARENCE.Marland KitchenCXR NORMAL 1993 PER HX..s/p [facility]R Rx AT [id number].Marland KitchenREPEAT + 02/2010  . Cough due to angiotensin-converting enzyme inhibitor 05/27/2010  . Hyperthyroidism     Current Outpatient Prescriptions on File Prior to Visit  Medication Sig Dispense Refill  . amLODipine (NORVASC) 5 MG tablet Take 1 tablet (5 mg total) by mouth daily.  30 tablet  11  . docusate sodium (COLACE) 100 MG capsule 1-2 po daily as needed for  constipation.  100 capsule  0  . ferrous sulfate 325 (65 FE) MG tablet Take 1 tablet (325 mg total) by mouth daily with breakfast.  30 tablet  11  . furosemide (LASIX) 20 MG tablet Take 1 tablet (20 mg total) by mouth daily.  30 tablet  13  . meloxicam (MOBIC) 7.5 MG tablet Take 1 tablet (7.5 mg total) by mouth daily.  30 tablet  1  . metoprolol (LOPRESSOR) 50 MG tablet Take 1 tablet (50 mg total) by mouth 2 (two) times daily.  60 tablet  11  . propylthiouracil (PTU) 50 MG tablet Take 150 mg by mouth 3 (three) times daily.         No current facility-administered medications on file prior to visit.    Past Surgical History  Procedure Laterality Date  . Cesarean section      No Known Allergies  History   Social History  . Marital Status: Single    Spouse Name: N/A    Number of Children: N/A  . Years of Education: N/A   Occupational History  . Not on file.   Social History Main Topics  . Smoking status: Never Smoker   . Smokeless tobacco: Never Used  . Alcohol Use: Yes  . Drug Use: No  . Sexual Activity: Yes    Birth Control/ Protection: Surgical   Other Topics Concern  . Not on file   Social History Narrative  . No narrative on file    Family  History  Problem Relation Age of Onset  . Hypertension Father   . Sudden death Father   . Hyperlipidemia Neg Hx   . Heart attack Neg Hx   . Diabetes Neg Hx     BP 126/81  Pulse 59  Ht 5\' 5"  (1.651 m)  Wt 226 lb (102.513 kg)  BMI 37.61 kg/m2  LMP 07/15/2011   Review of Systems See hpi above.    Objective:   Physical Exam Gen: NAD  R knee: No gross deformity, ecchymoses, swelling. Minimal TTP medial joint line.  No lateral joint line, post patellar facet, or other TTP about knee. FROM. Negative ant/post drawers. Negative valgus/varus testing. Negative lachmanns. Minimally + medial mcmurrays, apleys.  Negative patellar apprehension, clarkes. NV intact distally.  L knee: FROM without pain, instability.     Assessment & Plan:  1. Right knee pain - Prior radiographs showed minimal evidence of DJD.  History and exam consistent with degenerative meniscal tear.  Improved with injection, mobic.  Continue current treatment.  F/u prn.

## 2013-02-11 ENCOUNTER — Other Ambulatory Visit: Payer: Self-pay | Admitting: Family Medicine

## 2013-02-11 DIAGNOSIS — Z1231 Encounter for screening mammogram for malignant neoplasm of breast: Secondary | ICD-10-CM

## 2013-02-15 ENCOUNTER — Ambulatory Visit (HOSPITAL_COMMUNITY)
Admission: RE | Admit: 2013-02-15 | Discharge: 2013-02-15 | Disposition: A | Discharge status: Home / Self Care | Payer: No Typology Code available for payment source | Source: Ambulatory Visit | Attending: Family Medicine | Admitting: Family Medicine

## 2013-02-15 ENCOUNTER — Other Ambulatory Visit: Payer: Self-pay | Admitting: Family Medicine

## 2013-02-15 DIAGNOSIS — Z1231 Encounter for screening mammogram for malignant neoplasm of breast: Secondary | ICD-10-CM

## 2013-03-24 ENCOUNTER — Encounter: Payer: No Typology Code available for payment source | Admitting: Obstetrics & Gynecology

## 2013-03-31 ENCOUNTER — Other Ambulatory Visit: Payer: Self-pay | Admitting: *Deleted

## 2013-04-01 MED ORDER — AMLODIPINE BESYLATE 5 MG PO TABS: 5.0000 mg | ORAL_TABLET | Freq: Every day | ORAL | Status: DC

## 2013-04-04 ENCOUNTER — Other Ambulatory Visit: Payer: Self-pay | Admitting: Family Medicine

## 2013-04-08 ENCOUNTER — Telehealth: Payer: Self-pay | Admitting: *Deleted

## 2013-04-08 NOTE — Telephone Encounter (Signed)
LM for pt to call back.  Would like to know if pt has received flu shot this season or if she wants one.  Angi Goodell,CMA

## 2013-04-11 ENCOUNTER — Telehealth: Payer: Self-pay | Admitting: Family Medicine

## 2013-04-11 NOTE — Telephone Encounter (Signed)
Documented this in her health maintenance. Jazmin Hartsell,CMA

## 2013-04-11 NOTE — Telephone Encounter (Signed)
[name] called back to say she doesn't want to get a flu shot

## 2013-05-02 ENCOUNTER — Other Ambulatory Visit: Payer: Self-pay | Admitting: Family Medicine

## 2013-05-16 ENCOUNTER — Other Ambulatory Visit: Payer: Self-pay | Admitting: Family Medicine

## 2013-05-31 ENCOUNTER — Other Ambulatory Visit: Payer: Self-pay | Admitting: Family Medicine

## 2013-06-21 ENCOUNTER — Other Ambulatory Visit: Payer: Self-pay | Admitting: Family Medicine

## 2013-08-18 ENCOUNTER — Emergency Department (HOSPITAL_COMMUNITY)
Admission: EM | Admit: 2013-08-18 | Discharge: 2013-08-18 | Disposition: A | Discharge status: Home / Self Care | Payer: No Typology Code available for payment source | Attending: Emergency Medicine | Admitting: Emergency Medicine

## 2013-08-18 ENCOUNTER — Encounter (HOSPITAL_COMMUNITY): Payer: Self-pay | Admitting: Emergency Medicine

## 2013-08-18 DIAGNOSIS — Z862 Personal history of diseases of the blood and blood-forming organs and certain disorders involving the immune mechanism: Secondary | ICD-10-CM | POA: Diagnosis not present

## 2013-08-18 DIAGNOSIS — Z79899 Other long term (current) drug therapy: Secondary | ICD-10-CM | POA: Insufficient documentation

## 2013-08-18 DIAGNOSIS — I10 Essential (primary) hypertension: Secondary | ICD-10-CM | POA: Diagnosis not present

## 2013-08-18 DIAGNOSIS — S139XXA Sprain of joints and ligaments of unspecified parts of neck, initial encounter: Secondary | ICD-10-CM | POA: Insufficient documentation

## 2013-08-18 DIAGNOSIS — S0993XA Unspecified injury of face, initial encounter: Secondary | ICD-10-CM | POA: Insufficient documentation

## 2013-08-18 DIAGNOSIS — I5030 Unspecified diastolic (congestive) heart failure: Secondary | ICD-10-CM | POA: Diagnosis not present

## 2013-08-18 DIAGNOSIS — Y9389 Activity, other specified: Secondary | ICD-10-CM | POA: Diagnosis not present

## 2013-08-18 DIAGNOSIS — S199XXA Unspecified injury of neck, initial encounter: Secondary | ICD-10-CM

## 2013-08-18 DIAGNOSIS — S161XXA Strain of muscle, fascia and tendon at neck level, initial encounter: Secondary | ICD-10-CM

## 2013-08-18 DIAGNOSIS — Y92410 Unspecified street and highway as the place of occurrence of the external cause: Secondary | ICD-10-CM | POA: Insufficient documentation

## 2013-08-18 DIAGNOSIS — Z8639 Personal history of other endocrine, nutritional and metabolic disease: Secondary | ICD-10-CM | POA: Diagnosis not present

## 2013-08-18 DIAGNOSIS — S43499A Other sprain of unspecified shoulder joint, initial encounter: Secondary | ICD-10-CM | POA: Diagnosis not present

## 2013-08-18 DIAGNOSIS — S46819A Strain of other muscles, fascia and tendons at shoulder and upper arm level, unspecified arm, initial encounter: Secondary | ICD-10-CM

## 2013-08-18 MED ORDER — HYDROCODONE-ACETAMINOPHEN 5-325 MG PO TABS: 1.0000 | ORAL_TABLET | Freq: Four times a day (QID) | ORAL | Status: DC | PRN

## 2013-08-18 MED ORDER — NAPROXEN 500 MG PO TABS: 500.0000 mg | ORAL_TABLET | Freq: Two times a day (BID) | ORAL | Status: DC

## 2013-08-18 MED ORDER — METHOCARBAMOL 500 MG PO TABS: 500.0000 mg | ORAL_TABLET | Freq: Two times a day (BID) | ORAL | Status: DC
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
## 2013-08-18 NOTE — ED Notes (Signed)
Pt ambulatory to exam room with steady gait.  
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
## 2013-08-19 NOTE — ED Provider Notes (Signed)
Medical screening examination/treatment/procedure(s) were performed by non-physician practitioner and as supervising physician I was immediately available for consultation/collaboration.   EKG Interpretation None       Babette Relic, MD 08/19/13 2122

## 2013-08-29 ENCOUNTER — Other Ambulatory Visit: Payer: Self-pay | Admitting: *Deleted

## 2013-08-30 ENCOUNTER — Other Ambulatory Visit: Payer: Self-pay | Admitting: Family Medicine

## 2013-08-30 MED ORDER — METOPROLOL TARTRATE 50 MG PO TABS: 50.0000 mg | ORAL_TABLET | Freq: Two times a day (BID) | ORAL | Status: DC

## 2013-11-15 ENCOUNTER — Other Ambulatory Visit: Payer: Self-pay | Admitting: *Deleted

## 2013-11-15 DIAGNOSIS — I10 Essential (primary) hypertension: Secondary | ICD-10-CM

## 2013-11-15 MED ORDER — FUROSEMIDE 20 MG PO TABS: 20.0000 mg | ORAL_TABLET | Freq: Every day | ORAL | Status: DC

## 2013-11-21 ENCOUNTER — Encounter (HOSPITAL_COMMUNITY): Payer: Self-pay | Admitting: Emergency Medicine

## 2013-11-21 ENCOUNTER — Other Ambulatory Visit: Payer: Self-pay | Admitting: *Deleted

## 2013-11-21 MED ORDER — FERROUS SULFATE 325 (65 FE) MG PO TABS: 325.0000 mg | ORAL_TABLET | Freq: Every day | ORAL | Status: DC

## 2014-02-07 ENCOUNTER — Encounter: Payer: Self-pay | Admitting: Family Medicine

## 2014-02-07 ENCOUNTER — Other Ambulatory Visit: Payer: Self-pay | Admitting: Family Medicine

## 2014-02-07 ENCOUNTER — Ambulatory Visit (INDEPENDENT_AMBULATORY_CARE_PROVIDER_SITE_OTHER): Payer: 59 | Admitting: Family Medicine

## 2014-02-07 VITALS — BP 150/74 | HR 71 | Temp 98.5°F | Ht 65.0 in | Wt 233.1 lb

## 2014-02-07 DIAGNOSIS — M25561 Pain in right knee: Secondary | ICD-10-CM

## 2014-02-07 DIAGNOSIS — I10 Essential (primary) hypertension: Secondary | ICD-10-CM

## 2014-02-07 DIAGNOSIS — N938 Other specified abnormal uterine and vaginal bleeding: Secondary | ICD-10-CM

## 2014-02-07 DIAGNOSIS — E0590 Thyrotoxicosis, unspecified without thyrotoxic crisis or storm: Secondary | ICD-10-CM

## 2014-02-07 DIAGNOSIS — Z0000 Encounter for general adult medical examination without abnormal findings: Secondary | ICD-10-CM

## 2014-02-07 LAB — POCT URINALYSIS DIPSTICK
Bilirubin, UA: NEGATIVE
Blood, UA: NEGATIVE
Glucose, UA: NEGATIVE
Ketones, UA: NEGATIVE
Leukocytes, UA: NEGATIVE
Nitrite, UA: NEGATIVE
Protein, UA: NEGATIVE
SPEC GRAV UA: 1.02
Urobilinogen, UA: 0.2
pH, UA: 7

## 2014-02-07 LAB — LIPID PANEL
Cholesterol: 192 mg/dL (ref 0–200)
HDL: 45 mg/dL (ref >39)
LDL Cholesterol: 128 mg/dL — ABNORMAL HIGH (ref 0–99)
Total CHOL/HDL Ratio: 4.3 Ratio
Triglycerides: 94 mg/dL (ref <150)
VLDL: 19 mg/dL (ref 0–40)

## 2014-02-07 LAB — COMPREHENSIVE METABOLIC PANEL
ALBUMIN: 4 g/dL (ref 3.5–5.2)
ALT: 11 U/L (ref 0–35)
AST: 15 U/L (ref 0–37)
Alkaline Phosphatase: 53 U/L (ref 39–117)
BUN: 13 mg/dL (ref 6–23)
CALCIUM: 9.2 mg/dL (ref 8.4–10.5)
CHLORIDE: 104 meq/L (ref 96–112)
CO2: 27 mEq/L (ref 19–32)
Creat: 0.81 mg/dL (ref 0.50–1.10)
Glucose, Bld: 86 mg/dL (ref 70–99)
Potassium: 3.6 mEq/L (ref 3.5–5.3)
Sodium: 141 mEq/L (ref 135–145)
TOTAL PROTEIN: 7.4 g/dL (ref 6.0–8.3)
Total Bilirubin: 0.3 mg/dL (ref 0.2–1.2)

## 2014-02-07 LAB — CBC
HEMATOCRIT: 36 % (ref 36.0–46.0)
HEMOGLOBIN: 10.9 g/dL — AB (ref 12.0–15.0)
MCH: 24.9 pg — ABNORMAL LOW (ref 26.0–34.0)
MCHC: 30.3 g/dL (ref 30.0–36.0)
MCV: 82.2 fL (ref 78.0–100.0)
MPV: 9.3 fL (ref 8.6–12.4)
PLATELETS: 295 10*3/uL (ref 150–400)
RBC: 4.38 MIL/uL (ref 3.87–5.11)
RDW: 14.5 % (ref 11.5–15.5)
WBC: 5.2 10*3/uL (ref 4.0–10.5)

## 2014-02-07 LAB — TSH: TSH: 1.322 u[IU]/mL (ref 0.350–4.500)

## 2014-02-07 MED ORDER — MELOXICAM 7.5 MG PO TABS: 7.5000 mg | ORAL_TABLET | Freq: Every day | ORAL | Status: DC

## 2014-02-07 NOTE — Patient Instructions (Signed)
It was nice to meet you. I did not see anything concerning on your physical exam today. I will call and let you know if any of your labs are not normal. I have referred you to a gynecologist.  Please make a follow up appointment in 3 months with your PCP.

## 2014-02-07 NOTE — Progress Notes (Signed)
49 y.o. year old female presents for well woman/preventative visit and annual examination without pap smear.  Acute Concerns: Menorrhagia: The patient states that her last menses lasted for approximately 14 days (it usually lasts 5-7 days).  She states that it started out like a normal menses, her bleeding became darker, and then returned to the bright red color. LMP was 01/15/14. She notes that since that time, she's had occasional spotting. While she was on her menses for 2 weeks, she ws using 10 Tampax tampons and 3-4 Stayfree pads per day (as she would often leak). Now that she's spotting, she's only using 1 Stayfree pad per day. She denies any decreased energy, SOB, or chest pain. Initially she states that her menses have always been regular, however it appears that she was worked up for abnormal uterine bleeding in 2013/2014 due to menorrhagia and her menses eventually regulated until now. She states that the bleeding now is worse than it was in 2013/2014.  She denies any history of increased bleeding (no epistaxis or abnormal bleeding after tooth extraction), no h/o easy bruising, and no family history of bleeding disorders. She notes an occasional stray hair on her chin but nothing significant.    In 06/2012 LH, TSH, prolactin, testosterone were within normal limits. FSH was in the menopausal range.   Transvaginal ultrasound 03/2011: Left intramural fundal fibroid measures 0.9 x 0.8 x 0.7 cm. Anterior midbody intramural fibroid with mild mass effect upon the endometrium measures 1.5 x 1.3 x 1.3 cm. Small right ovarian paraovarian cyst or hydrosalpinx.  Endometrial biopsy 07/2012: secretory endometrium with no hyperplasia or carcinoma.   CHF/Hypertension: Patient states she takes amlodipine, Lasix and metoprolol on a daily basis. Did not take her medication this AM yet. BPs ranging 130/80s at home.  No issues with dizziness, light headedness, and chest pain. No LE swelling.   Right knee pain:  Patient continues to endorse right knee pain, worse now that it is cold outside. She denies any h/o trauma. States the pain is worse in the AM, improves with movement initially, however if she does more than 1-2 hours of activity, the pain worsens. She denies any AM stiffness. No swelling, erythema, or warmth. Feels Mobic was beneficial (didn't take this regularly).   Social:  History   Social History  . Marital Status: Married    Spouse Name: N/A    Number of Children: N/A  . Years of Education: N/A   Social History Main Topics  . Smoking status: Never Smoker   . Smokeless tobacco: Never Used  . Alcohol Use: Yes  . Drug Use: No  . Sexual Activity: Yes    Birth Control/ Protection: Surgical   Other Topics Concern  . None   Social History Narrative    Immunization:  Tdap/TD: 01/02/2011  Influenza: Declined today   Cancer Screening:  Pap Smear: 07/2012; negative for intraepithelial lesion/malignancy (no co--testing with HPV)  Mammogram: Benign in 01/2013   Colonoscopy: N/A  Dexa: N/A  Additional: Patient with a PMHx of hyperthyroidism s/p radioiodine therapy in 2013.  Physical Exam: Blood pressure 150/74, pulse 71, temperature 98.5 F (36.9 C), temperature source Oral, height 5\' 5"  (1.651 m), weight 233 lb 1.6 oz (105.733 kg), last menstrual period 01/15/2014.  Repeat manual BP 146/88  GEN: Well nourished AAF in NAD. HEENT: Atraumatic, normocephalic. MMM. Oropharynx clear. TM non-erythematous, non-bulging bilaterally. No LAD. No thyromegaly.  CARDIAC: RRR, no m/r/g noted. No JVD. 2+ radial and DP pulses bilaterally. RESP: CTAB without  wheezing, rhonchi, or crackles. ABD: +BS. Soft, non-distended, non-tender to palpation. GU/GYN:Exam performed in the presence of a chaperone.  GYN:  External genitalia within normal limits.  Vaginal mucosa pink, moist, normal rugae.  Nonfriable cervix without lesions, no discharge or bleeding noted on speculum exam.  Bimanual exam revealed  normal, nongravid uterus.  No cervical motion tenderness. No adnexal masses bilaterally however exam limited by body habitus. EXT: No swelling, erythema, or effusions noted of the knees bilaterally. 5/5 strength in the knee flexion or extension on the left, 4+/5 strength in knee flexion/extension on the right.  SKIN: No rash noted.   ASSESSMENT & PLAN: 49 y.o. female presents for annual well woman/preventative exam and concerns of menorrhagia. Please see problem specific assessment and plan.

## 2014-02-07 NOTE — Assessment & Plan Note (Signed)
Patient s/p radioiodine therapy in 2013. - TSH today.

## 2014-02-07 NOTE — Assessment & Plan Note (Signed)
Patient's BP borderline today at 150-146/74-88, however she did not take her medication this AM. Per her report, BPs are in the 130s/80s.  - Continue with amlodipine, metoprolol, and Lasix. - If patient's BP continues to be elevated in the clinic despite her reported home BPs, consider home BP monitor. - CMET today.

## 2014-02-07 NOTE — Assessment & Plan Note (Signed)
Prior radiographs showed minimal evidence of DJD. She's had an injection in the past. Felt Mobic was beneficial in the past. No red flags on exam- no erythema, swelling, warmth, or effusions. Concerns over degenerative meniscal tear in the past.  - Refilled Mobic #30 - If no improvement, consider PT vs steroid injection vs Sports medicine referral.

## 2014-02-07 NOTE — Assessment & Plan Note (Signed)
Most likely secondary to fibroids noted on endometrial biopsy in 2014, however patient also appears to be going through menopause from [facility] results in 2014. In 2014, referral to gynecology for possible surgical intervention was suggested however the patient declined at that time. Discussed multiple options with the patient including repeating a transvaginal U/S and endometrial biopsy vs referral to gynecology for possible intervention (given her symptoms are worse now than previously).  - Will obtain CBC to assess hemoglobin  - Referral to gynecology

## 2014-04-12 ENCOUNTER — Other Ambulatory Visit: Payer: Self-pay | Admitting: Family Medicine

## 2014-04-12 NOTE — Telephone Encounter (Signed)
Need refill on Meloxicam.  Send to [facility] on [address].  Would like to have written for 90 day quantity.

## 2014-04-13 ENCOUNTER — Other Ambulatory Visit: Payer: Self-pay | Admitting: Family Medicine

## 2014-04-13 ENCOUNTER — Other Ambulatory Visit: Payer: Self-pay | Admitting: *Deleted

## 2014-04-13 MED ORDER — AMLODIPINE BESYLATE 5 MG PO TABS: 5.0000 mg | ORAL_TABLET | Freq: Every day | ORAL | Status: DC

## 2014-04-18 ENCOUNTER — Telehealth: Payer: Self-pay | Admitting: Family Medicine

## 2014-04-18 NOTE — Telephone Encounter (Signed)
Attempted to call, no answer and no machine.  As of 08/08/13 pt was switching practices to [facility] in [place] (her records were faxed there).  Is she wanting to come back here?  She will need an appt before any refills are given. Nicholson Starace, Salome Spotted

## 2014-04-18 NOTE — Telephone Encounter (Signed)
Pt called and needs a refill on her Amlodipine called in. jw

## 2014-04-18 NOTE — Telephone Encounter (Signed)
Pt needs appointment, have never seen before.  Thanks, Tamela Oddi. Awanda Mink, DO of [facility] 04/18/2014, 11:49 AM

## 2014-06-14 ENCOUNTER — Other Ambulatory Visit: Payer: Self-pay | Admitting: Family Medicine

## 2014-07-18 ENCOUNTER — Other Ambulatory Visit: Payer: Self-pay | Admitting: Family Medicine

## 2014-07-28 ENCOUNTER — Other Ambulatory Visit: Payer: Self-pay | Admitting: Family Medicine

## 2014-07-28 ENCOUNTER — Telehealth: Payer: Self-pay | Admitting: Family Medicine

## 2014-07-28 DIAGNOSIS — I10 Essential (primary) hypertension: Secondary | ICD-10-CM

## 2014-07-28 MED ORDER — AMLODIPINE BESYLATE 5 MG PO TABS: ORAL_TABLET | ORAL | Status: DC

## 2014-07-28 NOTE — Telephone Encounter (Signed)
Need refills on metoprolol and amlodipine.  Send to [facility] on [address]

## 2014-07-28 NOTE — Telephone Encounter (Signed)
Will RF x1.  Please have patient schedule OV for chronic disease management.  Thank you

## 2014-07-28 NOTE — Telephone Encounter (Signed)
Pt informed of below. Zimmerman Rumple, Chrisopher Pustejovsky D, CMA  

## 2014-08-23 ENCOUNTER — Encounter: Payer: Self-pay | Admitting: Family Medicine

## 2014-08-23 ENCOUNTER — Ambulatory Visit (INDEPENDENT_AMBULATORY_CARE_PROVIDER_SITE_OTHER): Payer: Self-pay | Admitting: Family Medicine

## 2014-08-23 VITALS — BP 142/76 | HR 59 | Temp 98.2°F | Ht 65.0 in | Wt 234.2 lb

## 2014-08-23 DIAGNOSIS — Z862 Personal history of diseases of the blood and blood-forming organs and certain disorders involving the immune mechanism: Secondary | ICD-10-CM

## 2014-08-23 DIAGNOSIS — I10 Essential (primary) hypertension: Secondary | ICD-10-CM

## 2014-08-23 DIAGNOSIS — N938 Other specified abnormal uterine and vaginal bleeding: Secondary | ICD-10-CM

## 2014-08-23 MED ORDER — METOPROLOL TARTRATE 50 MG PO TABS: 50.0000 mg | ORAL_TABLET | Freq: Two times a day (BID) | ORAL | Status: DC

## 2014-08-23 MED ORDER — FUROSEMIDE 20 MG PO TABS: 20.0000 mg | ORAL_TABLET | Freq: Every day | ORAL | Status: DC

## 2014-08-23 MED ORDER — AMLODIPINE BESYLATE 5 MG PO TABS: ORAL_TABLET | ORAL | Status: DC

## 2014-08-23 MED ORDER — FERROUS SULFATE 325 (65 FE) MG PO TABS: 325.0000 mg | ORAL_TABLET | Freq: Every day | ORAL | Status: DC

## 2014-08-23 NOTE — Patient Instructions (Signed)
Follow up with me in 1 year for physical exam with fasting labs. If you decide to have the IUD placed, you can call the office at [phone] and schedule with [facility] for IUD placement.  [name] M. [name], DO PGY-2, [facility]   Levonorgestrel intrauterine device (IUD) What is this medicine? LEVONORGESTREL IUD (LEE voe nor jes trel) is a contraceptive (birth control) device. The device is placed inside the uterus by a healthcare professional. It is used to prevent pregnancy and can also be used to treat heavy bleeding that occurs during your period. Depending on the device, it can be used for 3 to 5 years. This medicine may be used for other purposes; ask your health care provider or pharmacist if you have questions. COMMON BRAND NAME(S): Verda Cumins What should I tell my health care provider before I take this medicine? They need to know if you have any of these conditions: -abnormal Pap smear -cancer of the breast, uterus, or cervix -diabetes -endometritis -genital or pelvic infection now or in the past -have more than one sexual partner or your partner has more than one partner -heart disease -history of an ectopic or tubal pregnancy -immune system problems -IUD in place -liver disease or tumor -problems with blood clots or take blood-thinners -use intravenous drugs -uterus of unusual shape -vaginal bleeding that has not been explained -an unusual or allergic reaction to levonorgestrel, other hormones, silicone, or polyethylene, medicines, foods, dyes, or preservatives -pregnant or trying to get pregnant -breast-feeding How should I use this medicine? This device is placed inside the uterus by a health care professional. Talk to your pediatrician regarding the use of this medicine in children. Special care may be needed. Overdosage: If you think you have taken too much of this medicine contact a poison control center or emergency room at  once. NOTE: This medicine is only for you. Do not share this medicine with others. What if I miss a dose? This does not apply. What may interact with this medicine? Do not take this medicine with any of the following medications: -amprenavir -bosentan -fosamprenavir This medicine may also interact with the following medications: -aprepitant -barbiturate medicines for inducing sleep or treating seizures -bexarotene -griseofulvin -medicines to treat seizures like carbamazepine, ethotoin, felbamate, oxcarbazepine, phenytoin, topiramate -modafinil -pioglitazone -rifabutin -rifampin -rifapentine -some medicines to treat HIV infection like atazanavir, indinavir, lopinavir, nelfinavir, tipranavir, ritonavir -St. John's wort -warfarin This list may not describe all possible interactions. Give your health care provider a list of all the medicines, herbs, non-prescription drugs, or dietary supplements you use. Also tell them if you smoke, drink alcohol, or use illegal drugs. Some items may interact with your medicine. What should I watch for while using this medicine? Visit your doctor or health care professional for regular check ups. See your doctor if you or your partner has sexual contact with others, becomes HIV positive, or gets a sexual transmitted disease. This product does not protect you against HIV infection (AIDS) or other sexually transmitted diseases. You can check the placement of the IUD yourself by reaching up to the top of your vagina with clean fingers to feel the threads. Do not pull on the threads. It is a good habit to check placement after each menstrual period. Call your doctor right away if you feel more of the IUD than just the threads or if you cannot feel the threads at all. The IUD may come out by itself. You may become pregnant if the device  comes out. If you notice that the IUD has come out use a backup birth control method like condoms and call your health care  provider. Using tampons will not change the position of the IUD and are okay to use during your period. What side effects may I notice from receiving this medicine? Side effects that you should report to your doctor or health care professional as soon as possible: -allergic reactions like skin rash, itching or hives, swelling of the face, lips, or tongue -fever, flu-like symptoms -genital sores -high blood pressure -no menstrual period for 6 weeks during use -pain, swelling, warmth in the leg -pelvic pain or tenderness -severe or sudden headache -signs of pregnancy -stomach cramping -sudden shortness of breath -trouble with balance, talking, or walking -unusual vaginal bleeding, discharge -yellowing of the eyes or skin Side effects that usually do not require medical attention (report to your doctor or health care professional if they continue or are bothersome): -acne -breast pain -change in sex drive or performance -changes in weight -cramping, dizziness, or faintness while the device is being inserted -headache -irregular menstrual bleeding within first 3 to 6 months of use -nausea This list may not describe all possible side effects. Call your doctor for medical advice about side effects. You may report side effects to FDA at 1-800-FDA-1088. Where should I keep my medicine? This does not apply. NOTE: This sheet is a summary. It may not cover all possible information. If you have questions about this medicine, talk to your doctor, pharmacist, or health care provider.  2015, Elsevier/Gold Standard. (2011-02-06 13:54:04)

## 2014-08-23 NOTE — Progress Notes (Signed)
Patient ID: [name], female   DOB: 1965/12/11, 49 y.o.   MRN: [id number]    Subjective: CC: HTN, menorrhagia  HPI: Patient is a 49 y.o. female presenting to clinic today for follow up. Concerns today include:  1. Hypertension Blood pressure at home: 135/70-80's.  Low salt diet.  Low fat diet.  Lean meats and fish.  Physical activity, walks 1-2 times weekly.  Does not take a multivitamin Blood pressure today: 142/76. Meds: Compliant with Norvasc, Metoprolol and Lasix for LE edema. Side effects: occasional muscle cramping.   ROS: Denies headache, dizziness, visual changes, nausea, vomiting, chest pain, abdominal pain or shortness of breath.  2. Heavy periods Patient reports heavy periods for several years (3-5).  Occasionally passes blood clots.  Patient's last menstrual period was 08/17/2014.  Continues to have monthly periods.  Has paused about 3-4 months last year but resumed periods.  Periods are lasting 7 days.  First 3 days are the heaviest then they lighten up.  Has tried natural remedies for bleeding.  Has BTL.  No family h/o bleeding disorders/clots. No cramps. On Iron supplementation.  No constipation.  Social History Reviewed: non smoker. FamHx and MedHx updated.  Please see EMR. Health Maintenance: colonoscopy due  ROS: All other systems reviewed and are negative.  Objective: Office vital signs reviewed. BP 142/76 mmHg  Pulse 59  Temp(Src) 98.2 F (36.8 C) (Oral)  Ht 5\' 5"  (1.651 m)  Wt 234 lb 3.2 oz (106.232 kg)  BMI 38.97 kg/m2  LMP 08/17/2014  Physical Examination:  General: Awake, alert, well nourished, NAD HEENT: Normal, EOMI Cardio: RRR, S1S2 heard, no murmurs appreciated Pulm: CTAB, no wheezes, rhonchi or rales, no increased WOB Extremities: WWP, No edema, cyanosis or clubbing; +2 pulses bilaterally MSK: Normal gait and station  Assessment: 49 y.o. female HTN, menorrhagia   Plan: See Problem List and After Visit Summary   Janora Norlander,  DO PGY-2, [place]

## 2014-08-23 NOTE — Assessment & Plan Note (Signed)
Controlled.  -RF Metoprolol, Norvasc and Lasix today. -Continue lifestyle modification -Patient to continue to monitor BP at home -Return in 1 year for annual PE.

## 2014-08-23 NOTE — Assessment & Plan Note (Signed)
Suspect that she is perimenopausal.   -Patient to consider IUD for menorrhagia. -Follow up PRN

## 2014-08-23 NOTE — Assessment & Plan Note (Signed)
RF Ferrous sulfate.

## 2014-10-09 ENCOUNTER — Ambulatory Visit (INDEPENDENT_AMBULATORY_CARE_PROVIDER_SITE_OTHER): Payer: Self-pay | Admitting: Family Medicine

## 2014-10-09 ENCOUNTER — Other Ambulatory Visit: Payer: Self-pay | Admitting: Family Medicine

## 2014-10-09 VITALS — BP 142/76 | HR 60 | Temp 98.3°F | Ht 65.0 in | Wt 232.5 lb

## 2014-10-09 DIAGNOSIS — M549 Dorsalgia, unspecified: Secondary | ICD-10-CM

## 2014-10-09 DIAGNOSIS — M5441 Lumbago with sciatica, right side: Secondary | ICD-10-CM

## 2014-10-09 DIAGNOSIS — K219 Gastro-esophageal reflux disease without esophagitis: Secondary | ICD-10-CM | POA: Insufficient documentation

## 2014-10-09 DIAGNOSIS — R0789 Other chest pain: Secondary | ICD-10-CM

## 2014-10-09 HISTORY — DX: Lumbago with sciatica, right side: M54.41

## 2014-10-09 MED ORDER — CYCLOBENZAPRINE HCL 10 MG PO TABS: 5.0000 mg | ORAL_TABLET | Freq: Three times a day (TID) | ORAL | Status: DC | PRN

## 2014-10-09 MED ORDER — OMEPRAZOLE 20 MG PO CPDR: 20.0000 mg | DELAYED_RELEASE_CAPSULE | Freq: Every day | ORAL | Status: DC

## 2014-10-09 MED ORDER — NAPROXEN 500 MG PO TBEC: 500.0000 mg | DELAYED_RELEASE_TABLET | Freq: Two times a day (BID) | ORAL | Status: DC

## 2014-10-09 NOTE — Progress Notes (Signed)
Subjective:    Patient ID: [name], female    DOB: 04-07-1965, 49 y.o.   MRN: [id number]  [name] is a 49 y.o. female presenting on 10/09/2014 for No chief complaint on file.  Patient presents for a same day appointment.  HPI  CHEST / BACK PAIN, LEFT SIDED: - Reported symptoms started about 1 month ago, initially with "entire back was hurting", top to bottom and left and right, stated symptoms started with a acute onset sharp / aching back pain "20" on scale 10, never felt a pain like this before, noticed the pain started when laying flat and was trying to go to sleep, significantly worse with turning or twisting, especially if trying to sit up. Denies any prior known injury or overuse before onset, she works as a Haematologist, no prior back pain or injuries, no surgeries. Stated that the initial episode lasted for about 2-3 days, only when laying down or turning/getting up, but had 0/10 pain while active and standing and at work. Then pain had completely resolved until 1 week ago, return of symptoms acutely without clear inciting injury or trigger, currently pain ocalized now on front Left chest and radiating to back on that left side, feels pain "inside", worse with deep breaths, can feel "pulling sensation on chest" difficulty lifting left arm with triggering "entire back pain", severity now slightly improved to 7/10, - She has changed beds and mattresses without any relief. - Tried Meloxicam 15mg  daily for 3 days within past 1 week without any relief, (has mobic for knee pain, since resolved), tried massage, heating pad / ice packs without any relief - Admits little cough yesterday, non-productive - Denies any associated SOB or DOE - History of PNA about 3 years ago, not similar. Denies history of DVT/PE - No prior history of GERD or heartburn. Not taking any OTC meds for this. But does admit to sensation in back of throat and frequent clearing throat   Past Medical History  Diagnosis  Date  . Hypertension     DX SEVERAL DECADES YRS AGO- UNTRREATED UNTIL ADMISSION 02/2010  . Diastolic heart failure 08/1273    NEW ON ECHO 02/2010  . Hemoptysis 02/2010    DUE TO ALV HGE.Marland KitchenMarland KitchenANA NEGATIVE...DEEMED DUE TO DIASTOLIC CHF  . PPD positive     NEGATIVE DURING CHILDHOOD IN [facility]..1ST + TEST 1993 DURING IMMIGRATION GRREN CARD CLEARENCE.Marland KitchenCXR NORMAL 1993 PER HX..s/p INR Rx AT [facility] [id number].Marland KitchenREPEAT + 02/2010  . Cough due to angiotensin-converting enzyme inhibitor 05/27/2010  . Hyperthyroidism     Social History   Social History  . Marital Status: Married    Spouse Name: N/A  . Number of Children: N/A  . Years of Education: N/A   Occupational History  . Not on file.   Social History Main Topics  . Smoking status: Never Smoker   . Smokeless tobacco: Never Used  . Alcohol Use: Yes  . Drug Use: No  . Sexual Activity: Yes    Birth Control/ Protection: Surgical   Other Topics Concern  . Not on file   Social History Narrative    Current Outpatient Prescriptions on File Prior to Visit  Medication Sig  . amLODipine (NORVASC) 5 MG tablet TAKE 1 TABLET(5 MG) BY MOUTH DAILY  . ferrous sulfate 325 (65 FE) MG tablet Take 1 tablet (325 mg total) by mouth daily with breakfast.  . furosemide (LASIX) 20 MG tablet Take 1 tablet (20 mg total) by mouth daily.  . meloxicam (  MOBIC) 15 MG tablet Take 1 tablet (15 mg total) by mouth daily.  . metoprolol (LOPRESSOR) 50 MG tablet Take 1 tablet (50 mg total) by mouth 2 (two) times daily.   No current facility-administered medications on file prior to visit.    Review of Systems  Constitutional: Negative for fever, chills, diaphoresis, activity change, appetite change and fatigue.  HENT: Negative for congestion and hearing loss.        Globus sensation, constant throat clearing  Eyes: Negative for visual disturbance.  Respiratory: Negative for cough, chest tightness, shortness of breath and wheezing.   Cardiovascular:  Positive for chest pain (left lateral with radiation to entire back, non-reproducible). Negative for palpitations and leg swelling.  Gastrointestinal: Negative for nausea, vomiting, abdominal pain, diarrhea and constipation.  Genitourinary: Negative for dysuria, frequency and hematuria.  Musculoskeletal: Positive for myalgias and back pain (generalized, T to L spine, bilateral, positional, not active). Negative for arthralgias and neck pain.  Skin: Negative for rash.  Neurological: Negative for dizziness, weakness, light-headedness, numbness and headaches.  Hematological: Negative for adenopathy.  Psychiatric/Behavioral: Negative for behavioral problems and confusion.   Per HPI unless specifically indicated above     Objective:    BP 142/76 mmHg  Pulse 60  Temp(Src) 98.3 F (36.8 C) (Oral)  Ht 5\' 5"  (1.651 m)  Wt 232 lb 8 oz (105.461 kg)  BMI 38.69 kg/m2  LMP 06/21/2014 (Approximate)  Wt Readings from Last 3 Encounters:  10/09/14 232 lb 8 oz (105.461 kg)  08/23/14 234 lb 3.2 oz (106.232 kg)  02/07/14 233 lb 1.6 oz (105.733 kg)    Physical Exam  Constitutional: She is oriented to person, place, and time. She appears well-developed and well-nourished. No distress.  HENT:  Head: Normocephalic and atraumatic.  Mouth/Throat: Oropharynx is clear and moist.  Eyes: Conjunctivae and EOM are normal. Pupils are equal, round, and reactive to light.  Neck: Normal range of motion. Neck supple. No thyromegaly present.  Cardiovascular: Normal rate, regular rhythm, normal heart sounds and intact distal pulses.   No murmur heard. Pulmonary/Chest: Effort normal and breath sounds normal. No respiratory distress. She has no wheezes. She has no rales. She exhibits no tenderness (non-tender to palpation across chest wall, left lateral ribcage).  Abdominal: Soft. Bowel sounds are normal. She exhibits no distension and no mass. There is no tenderness. There is no rebound and no guarding.    Musculoskeletal: Normal range of motion. She exhibits no edema or tenderness.       Left shoulder: Normal. She exhibits normal range of motion, no tenderness, no bony tenderness, no swelling, no effusion, no crepitus, no deformity, no pain, no spasm and normal strength.  Lymphadenopathy:    She has no cervical adenopathy.  Neurological: She is alert and oriented to person, place, and time.  Skin: Skin is warm and dry. No rash noted. She is not diaphoretic.  Nursing note and vitals reviewed.  Results for orders placed or performed in visit on 02/07/14  Lipid Panel  Result Value Ref Range   Cholesterol 192 0 - 200 mg/dL   Triglycerides 94 <150 mg/dL   HDL 45 >39 mg/dL   Total CHOL/HDL Ratio 4.3 Ratio   VLDL 19 0 - 40 mg/dL   LDL Cholesterol 128 (H) 0 - 99 mg/dL  TSH  Result Value Ref Range   TSH 1.322 0.350 - 4.500 uIU/mL  Comprehensive metabolic panel  Result Value Ref Range   Sodium 141 135 - 145 mEq/L   Potassium  3.6 3.5 - 5.3 mEq/L   Chloride 104 96 - 112 mEq/L   CO2 27 19 - 32 mEq/L   Glucose, Bld 86 70 - 99 mg/dL   BUN 13 6 - 23 mg/dL   Creat 0.81 0.50 - 1.10 mg/dL   Total Bilirubin 0.3 0.2 - 1.2 mg/dL   Alkaline Phosphatase 53 39 - 117 U/L   AST 15 0 - 37 U/L   ALT 11 0 - 35 U/L   Total Protein 7.4 6.0 - 8.3 g/dL   Albumin 4.0 3.5 - 5.2 g/dL   Calcium 9.2 8.4 - 10.5 mg/dL  CBC  Result Value Ref Range   WBC 5.2 4.0 - 10.5 K/uL   RBC 4.38 3.87 - 5.11 MIL/uL   Hemoglobin 10.9 (L) 12.0 - 15.0 g/dL   HCT 36.0 36.0 - 46.0 %   MCV 82.2 78.0 - 100.0 fL   MCH 24.9 (L) 26.0 - 34.0 pg   MCHC 30.3 30.0 - 36.0 g/dL   RDW 14.5 11.5 - 15.5 %   Platelets 295 150 - 400 K/uL   MPV 9.3 8.6 - 12.4 fL  Urinalysis Dipstick  Result Value Ref Range   Color, UA YELLOW    Clarity, UA CLEAR    Glucose, UA NEG    Bilirubin, UA NEG    Ketones, UA NEG    Spec Grav, UA 1.020    Blood, UA NEG    pH, UA 7.0    Protein, UA NEG    Urobilinogen, UA 0.2    Nitrite, UA NEG     Leukocytes, UA Negative       Assessment & Plan:   Problem List Items Addressed This Visit      Digestive   GERD (gastroesophageal reflux disease)    See full details under "atypical chest pain", chief complaint - GERD is presumed new diagnosis today, no prior known GERD. No prior heartburn or therapy. Consistent with atypical picture with worsening night-time chest pain, +globus sensation, frequent throat clearing, non-productive occasional cough.  Plan: 1. Empiric trial on PPI with omeprazole 20mg  daily x 2-4 weeks, may need to inc dose in future if helping 2. RTC 3-4 days re-evaluation        Other   Atypical chest pain - Primary    Clinically atypical (positional laying flat and sitting up, pleuritic, non-exertional) chest pain >1 month with intermittent episodes, now more persistent >1 week, significantly worse at night otherwise mostly pain free during day while active, associated with diffuse bilateral back pain. Not associated with SOB/DOE. No inciting injury or trauma. Not improved with change mattress, Mobic x 3 days, topical massage / heating pad. Unclear exact etiology, suspect may be related to GERD (+occasional cough, globus, throat clearing, night-time pain), alternatively possible pericarditis (seems less likely with extensive pain free periods and non-TTP). Unlikely PE (Well's score 0, no SOB, HR 60, no prior DVT/PE, no provoking risk factor). Ultimately, reassuring history and unlikely for ACS (age 87, controlled HTN, never smoker, no DM, and no known CAD, w/o significant cardiac fam hx).  Plan: 1. Reassurance today, goal to rule out serious etiologies 2. Empiric trial on PPI with omeprazole 20mg  daily x 2-4 weeks, may need to inc dose in future if helping 3. Also empiric provided rx for Naproxen 500mg  BID x 1-2 weeks then PRN if possible MSK etiology, as trial to see if improving, advised may try the PPI first for few days, then can add Naproxen see if improving 4.  Rx  Flexeril 5-10mg  TID PRN, may additionally help with pain and possible MSK etiology, may help sleep at night with sedation 5. Strict return criteria given, when to go to clinic vs ED 6. RTC 3-4 days (before end of week) for repeat evaluation see if improving on therapies, alternatively could check CXR if patient requests (could r/o PNA with some coughing, but unlikely with afebrile and non-productive)      Relevant Medications   naproxen (EC NAPROSYN) 500 MG EC tablet   Back pain    See details under "atypical chest pain" - Possible MSK back pain component with this as well vs radiation of pain from chest to back, seems very positional and MSK related, significantly worse at night. No injury/trauma or findings or red flags on exam.  Plan: 1. Trial on Naproxen and Flexeril PRN 2. RICE therapy 3. RTC re-evaluation      Relevant Medications   naproxen (EC NAPROSYN) 500 MG EC tablet   cyclobenzaprine (FLEXERIL) 10 MG tablet      Meds ordered this encounter  Medications  . DISCONTD: omeprazole (PRILOSEC) 20 MG capsule    Sig: Take 1 capsule (20 mg total) by mouth daily.    Dispense:  30 capsule    Refill:  1  . naproxen (EC NAPROSYN) 500 MG EC tablet    Sig: Take 1 tablet (500 mg total) by mouth 2 (two) times daily with a meal.    Dispense:  30 tablet    Refill:  1  . cyclobenzaprine (FLEXERIL) 10 MG tablet    Sig: Take 0.5-1 tablets (5-10 mg total) by mouth 3 (three) times daily as needed for muscle spasms.    Dispense:  30 tablet    Refill:  1      Follow up plan: Return in about 1 week (around 10/16/2014), or if symptoms worsen or fail to improve, for chest and back pain, ?GERD.  Nobie Putnam, [place], PGY-3

## 2014-10-09 NOTE — Patient Instructions (Signed)
Dear [name], Thank you for coming in to clinic today.  1. As discussed, I don't have an exact answer for your symptoms. I think that this is less likely to be caused by your Heart or other concerning issue. This may be due to some Stomach Acid Reflux, also can be related to muscle spasms or pain. - Start with several medicines - Take Omeprazole 20mg  daily about 30 min before eating every day for next 1 month for stomach acid - Take Naproxen 500mg  with food and plenty of water twice daily (for 1-2 weeks every day, if not helping can stop after about 1 week, then use as needed), may switch to Tylenol Extra Strength 500mg  tabs - take 1-2 tabs every 6 hours as needed - Flexeril is muscle relaxant, take half tablet to whole tab, every 8 hours as needed or up to 3 times a day, start out only at night as this may make you sleepy and drowsy, be careful driving car or working on this medicine  If you have any worsening chest pain or pressure that does not go away within 30 minutes, is accompanied by nausea, sweating, shortness of breath, or made worse by activity, please call 911 or go to the emergency room immediately for evaluation.   Please schedule a follow-up appointment with Dr. [name] within next 3-4 days, prefer by end of week, otherwise within 1 week for close follow-up.  If you have any other questions or concerns, please feel free to call the clinic to contact me. You may also schedule an earlier appointment if necessary.  However, if your symptoms get significantly worse, please go to the Emergency Department to seek immediate medical attention.  Nobie Putnam, [place]

## 2014-10-10 ENCOUNTER — Encounter: Payer: Self-pay | Admitting: Family Medicine

## 2014-10-10 NOTE — Assessment & Plan Note (Signed)
Clinically atypical (positional laying flat and sitting up, pleuritic, non-exertional) chest pain >1 month with intermittent episodes, now more persistent >1 week, significantly worse at night otherwise mostly pain free during day while active, associated with diffuse bilateral back pain. Not associated with SOB/DOE. No inciting injury or trauma. Not improved with change mattress, Mobic x 3 days, topical massage / heating pad. Unclear exact etiology, suspect may be related to GERD (+occasional cough, globus, throat clearing, night-time pain), alternatively possible pericarditis (seems less likely with extensive pain free periods and non-TTP). Unlikely PE (Well's score 0, no SOB, HR 60, no prior DVT/PE, no provoking risk factor). Ultimately, reassuring history and unlikely for ACS (age 1, controlled HTN, never smoker, no DM, and no known CAD, w/o significant cardiac fam hx).  Plan: 1. Reassurance today, goal to rule out serious etiologies 2. Empiric trial on PPI with omeprazole 20mg  daily x 2-4 weeks, may need to inc dose in future if helping 3. Also empiric provided rx for Naproxen 500mg  BID x 1-2 weeks then PRN if possible MSK etiology, as trial to see if improving, advised may try the PPI first for few days, then can add Naproxen see if improving 4. Rx Flexeril 5-10mg  TID PRN, may additionally help with pain and possible MSK etiology, may help sleep at night with sedation 5. Strict return criteria given, when to go to clinic vs ED 6. RTC 3-4 days (before end of week) for repeat evaluation see if improving on therapies, alternatively could check CXR if patient requests (could r/o PNA with some coughing, but unlikely with afebrile and non-productive)

## 2014-10-10 NOTE — Assessment & Plan Note (Signed)
See details under "atypical chest pain" - Possible MSK back pain component with this as well vs radiation of pain from chest to back, seems very positional and MSK related, significantly worse at night. No injury/trauma or findings or red flags on exam.  Plan: 1. Trial on Naproxen and Flexeril PRN 2. RICE therapy 3. RTC re-evaluation

## 2014-10-10 NOTE — Assessment & Plan Note (Signed)
See full details under "atypical chest pain", chief complaint - GERD is presumed new diagnosis today, no prior known GERD. No prior heartburn or therapy. Consistent with atypical picture with worsening night-time chest pain, +globus sensation, frequent throat clearing, non-productive occasional cough.  Plan: 1. Empiric trial on PPI with omeprazole 20mg  daily x 2-4 weeks, may need to inc dose in future if helping 2. RTC 3-4 days re-evaluation

## 2014-10-27 ENCOUNTER — Emergency Department (HOSPITAL_COMMUNITY)
Admission: EM | Admit: 2014-10-27 | Discharge: 2014-10-28 | Disposition: A | Discharge status: Home / Self Care | Payer: Self-pay | Attending: Emergency Medicine | Admitting: Emergency Medicine

## 2014-10-27 ENCOUNTER — Emergency Department (HOSPITAL_COMMUNITY): Payer: Self-pay

## 2014-10-27 ENCOUNTER — Encounter (HOSPITAL_COMMUNITY): Payer: Self-pay | Admitting: Emergency Medicine

## 2014-10-27 DIAGNOSIS — M546 Pain in thoracic spine: Secondary | ICD-10-CM | POA: Insufficient documentation

## 2014-10-27 DIAGNOSIS — I5030 Unspecified diastolic (congestive) heart failure: Secondary | ICD-10-CM | POA: Insufficient documentation

## 2014-10-27 DIAGNOSIS — Z79899 Other long term (current) drug therapy: Secondary | ICD-10-CM | POA: Insufficient documentation

## 2014-10-27 DIAGNOSIS — Z8639 Personal history of other endocrine, nutritional and metabolic disease: Secondary | ICD-10-CM | POA: Insufficient documentation

## 2014-10-27 DIAGNOSIS — I10 Essential (primary) hypertension: Secondary | ICD-10-CM | POA: Insufficient documentation

## 2014-10-27 DIAGNOSIS — Z791 Long term (current) use of non-steroidal anti-inflammatories (NSAID): Secondary | ICD-10-CM | POA: Insufficient documentation

## 2014-10-27 LAB — URINALYSIS, ROUTINE W REFLEX MICROSCOPIC
Bilirubin Urine: NEGATIVE
Glucose, UA: NEGATIVE mg/dL
Hgb urine dipstick: NEGATIVE
Ketones, ur: NEGATIVE mg/dL
LEUKOCYTES UA: NEGATIVE
NITRITE: NEGATIVE
PH: 5.5 (ref 5.0–8.0)
Protein, ur: NEGATIVE mg/dL
SPECIFIC GRAVITY, URINE: 1.026 (ref 1.005–1.030)
Urobilinogen, UA: 0.2 mg/dL (ref 0.0–1.0)

## 2014-10-27 MED ORDER — TRAMADOL HCL 50 MG PO TABS
50.0000 mg | ORAL_TABLET | Freq: Once | ORAL | Status: AC
  Administered 2014-10-27: 50 mg via ORAL
  Filled 2014-10-27: qty 1

## 2014-10-27 NOTE — ED Provider Notes (Signed)
CSN: [id number]     Arrival date & time 10/27/14  2100 History   First MD Initiated Contact with Patient 10/27/14 2224     Chief Complaint  Patient presents with  . Back Pain     (Consider location/radiation/quality/duration/timing/severity/associated sxs/prior Treatment) HPI  Kerigan Narvaez is a 49 y.o. female presents with right side thoracic back pain that began about two weeks ago while she was lying in bed. Pt was seen by her PCP, who prescribed flexeril and naproxen. Pt instructed to go to the ED should pain not subside. Pt states neither medication helped her pain and that she feels like "the pain is deep inside."  No abdominal surgeries other than a C-section. Pt denies any dysuria, hematuria, difficulty urinating, fever/chills, N/V/C/D, incontinence, saddle anesthesias, or any other complaints. Pt has no history of IV drug use, history of cancer, or recent illness.  Past Medical History  Diagnosis Date  . Hypertension     DX SEVERAL DECADES YRS AGO- UNTRREATED UNTIL ADMISSION 02/2010  . Diastolic heart failure 03/2353    NEW ON ECHO 02/2010  . Hemoptysis 02/2010    DUE TO ALV HGE.Marland KitchenMarland KitchenANA NEGATIVE...DEEMED DUE TO DIASTOLIC CHF  . PPD positive     NEGATIVE DURING CHILDHOOD IN [facility]..1ST + TEST 1993 DURING IMMIGRATION GRREN CARD CLEARENCE.Marland KitchenCXR NORMAL 1993 PER HX..s/p INR Rx AT [facility] [id number].Marland KitchenREPEAT + 02/2010  . Cough due to angiotensin-converting enzyme inhibitor 05/27/2010  . Hyperthyroidism    Past Surgical History  Procedure Laterality Date  . Cesarean section     Family History  Problem Relation Age of Onset  . Hypertension Father   . Sudden death Father   . Hyperlipidemia Neg Hx   . Heart attack Neg Hx   . Diabetes Neg Hx    Social History  Substance Use Topics  . Smoking status: Never Smoker   . Smokeless tobacco: Never Used  . Alcohol Use: Yes   OB History    Gravida Para Term Preterm AB TAB SAB Ectopic Multiple Living   7    2 1 1   5      Review  of Systems  Constitutional: Negative for fever, chills, diaphoresis, fatigue and unexpected weight change.  HENT: Negative for congestion.   Respiratory: Negative for cough, chest tightness and shortness of breath.   Cardiovascular: Negative for chest pain, palpitations and leg swelling.  Gastrointestinal: Negative for nausea, vomiting, abdominal pain, diarrhea, constipation and blood in stool.  Genitourinary: Negative for dysuria, urgency, frequency, hematuria, flank pain, decreased urine volume, vaginal bleeding, vaginal discharge, difficulty urinating, vaginal pain and pelvic pain.  Musculoskeletal: Positive for back pain. Negative for neck pain.  Skin: Negative for color change and pallor.  Neurological: Negative for dizziness, syncope, weakness, light-headedness and headaches.  All other systems reviewed and are negative.     Allergies  Review of patient's allergies indicates no known allergies.  Home Medications   Prior to Admission medications   Medication Sig Start Date End Date Taking? Authorizing Provider  amLODipine (NORVASC) 5 MG tablet TAKE 1 TABLET(5 MG) BY MOUTH DAILY 08/23/14   Janora Norlander, DO  cyclobenzaprine (FLEXERIL) 10 MG tablet Take 0.5-1 tablets (5-10 mg total) by mouth 3 (three) times daily as needed for muscle spasms. 10/09/14   Olin Hauser, DO  ferrous sulfate 325 (65 FE) MG tablet Take 1 tablet (325 mg total) by mouth daily with breakfast. 08/23/14   Janora Norlander, DO  furosemide (LASIX) 20 MG tablet Take 1  tablet (20 mg total) by mouth daily. 08/23/14   Ashly Windell Moulding, DO  meloxicam (MOBIC) 15 MG tablet Take 1 tablet (15 mg total) by mouth daily. 02/08/14   Tamela Oddi Hess, DO  metoprolol (LOPRESSOR) 50 MG tablet Take 1 tablet (50 mg total) by mouth 2 (two) times daily. 08/23/14   Janora Norlander, DO  naproxen (EC NAPROSYN) 500 MG EC tablet Take 1 tablet (500 mg total) by mouth 2 (two) times daily with a meal. 10/09/14   Olin Hauser, DO  omeprazole (PRILOSEC) 20 MG capsule TAKE 1 CAPSULE(20 MG) BY MOUTH DAILY 10/09/14   Ashly Windell Moulding, DO  traMADol (ULTRAM) 50 MG tablet Take 1 tablet (50 mg total) by mouth every 6 (six) hours as needed. 10/28/14   Shawn C Joy, PA-C   BP 153/85 mmHg  Pulse 74  Temp(Src) 98.6 F (37 C) (Oral)  Resp 16  SpO2 100%  LMP 06/21/2014 (Approximate) Physical Exam  Constitutional: She is oriented to person, place, and time. She appears well-developed and well-nourished. No distress.  HENT:  Head: Normocephalic and atraumatic.  Eyes: Conjunctivae and EOM are normal. Pupils are equal, round, and reactive to light.  Cardiovascular: Normal rate, regular rhythm and normal heart sounds.   Pulmonary/Chest: Effort normal and breath sounds normal. No respiratory distress.  Abdominal: Soft. Bowel sounds are normal.  Musculoskeletal: She exhibits no edema or tenderness.       Arms: Neurological: She is alert and oriented to person, place, and time.  Skin: Skin is warm and dry. She is not diaphoretic.  Nursing note and vitals reviewed.   ED Course  Procedures (including critical care time) Labs Review Labs Reviewed  URINALYSIS, ROUTINE W REFLEX MICROSCOPIC (NOT AT [facility])    Imaging Review Dg Chest 2 View  10/27/2014   CLINICAL DATA:  Back pain beginning 2 weeks ago, not improving. History of hypertension, heart failure, positive PPD.  EXAM: CHEST  2 VIEW  COMPARISON:  Chest radiograph November 21, 2010  FINDINGS: Cardiomediastinal silhouette is normal. Mild pulmonary vascular congestion. The lungs are clear without pleural effusions or focal consolidations. Trachea projects midline and there is no pneumothorax. Soft tissue planes and included osseous structures are non-suspicious. Mild degenerative change of the thoracic spine.  IMPRESSION: Mild pulmonary vascular congestion.   Electronically Signed   By: Elon Alas M.D.   On: 10/27/2014 23:38   I have personally reviewed and  evaluated these images and lab results as part of my medical decision-making.   EKG Interpretation None      MDM   Final diagnoses:  Right-sided thoracic back pain    [name] presents with right sided thoracic back pain for the past two weeks, reproducible on palpation and with no additional symptoms.   Will prescribe tramadol for pain management.  11:18 PM Ambulated patient in hall. No gait disturbances. Pt required no assistance.  11:38 PM UA shows no abnormalities.  If CXR is without abnormalities, pt will be discharged with instructions for back exercises and tramadol for pain control.  11:52 PM Pt states her pain has been reduced to about 6/10.  CXR shows mild pulmonary vascular congestion. Pt has history of diastolic heart failure.   Will give Toradol injection. Pt to be discharged with some tramadol for pain management and with recommendation to followup with PCP to management continued pain and to order PT if necessary. Pt instructed to return to ED should symptoms worsen.  12:21 AM Toradol decreased pt  pain to about a 3/10. Pt to be discharged.   Lorayne Bender, PA-C 10/28/14 0023  Gareth Morgan, MD 10/31/14 1700

## 2014-10-27 NOTE — ED Notes (Addendum)
Pt reports to ED from home c/o back pain; pt states back pain began 2 weeks ago when she was lying in bed; pt denies being aware of any precipitating factors; pt denies pain or burning with urination; pt went to doctor 2 weeks ago and was prescribed naproxen and a muscle relaxer to be taken for 3-4 days; Doctor instructed her to come to ER if pain had not improved in 3-4 days; NAD noted

## 2014-10-28 MED ORDER — TRAMADOL HCL 50 MG PO TABS: 50.0000 mg | ORAL_TABLET | Freq: Four times a day (QID) | ORAL | Status: DC | PRN

## 2014-10-28 MED ORDER — KETOROLAC TROMETHAMINE 60 MG/2ML IM SOLN
60.0000 mg | Freq: Once | INTRAMUSCULAR | Status: AC
  Administered 2014-10-28: 60 mg via INTRAMUSCULAR
  Filled 2014-10-28: qty 2

## 2014-10-28 NOTE — Discharge Instructions (Signed)
You have been seen this evening for back pain. You have been prescribed tramadol for pain control.  Please see the attached back exercise instructions to better help you as you recover.  You are encouraged to followup with a PCP this week if you continue to have pain. A resource guide for PCP selection is attached. Return to ED should symptoms worsen.     Back Pain, Adult Back pain is very common in adults.The cause of back pain is rarely dangerous and the pain often gets better over time.The cause of your back pain may not be known. Some common causes of back pain include: 1. Strain of the muscles or ligaments supporting the spine. 2. Wear and tear (degeneration) of the spinal disks. 3. Arthritis. 4. Direct injury to the back. For many people, back pain may return. Since back pain is rarely dangerous, most people can learn to manage this condition on their own. HOME CARE INSTRUCTIONS Watch your back pain for any changes. The following actions may help to lessen any discomfort you are feeling: 1. Remain active. It is stressful on your back to sit or stand in one place for long periods of time. Do not sit, drive, or stand in one place for more than 30 minutes at a time. Take short walks on even surfaces as soon as you are able.Try to increase the length of time you walk each day. 2. Exercise regularly as directed by your health care provider. Exercise helps your back heal faster. It also helps avoid future injury by keeping your muscles strong and flexible. 3. Do not stay in bed.Resting more than 1-2 days can delay your recovery. 4. Pay attention to your body when you bend and lift. The most comfortable positions are those that put less stress on your recovering back. Always use proper lifting techniques, including: 1. Bending your knees. 2. Keeping the load close to your body. 3. Avoiding twisting. 5. Find a comfortable position to sleep. Use a firm mattress and lie on your side with your  knees slightly bent. If you lie on your back, put a pillow under your knees. 6. Avoid feeling anxious or stressed.Stress increases muscle tension and can worsen back pain.It is important to recognize when you are anxious or stressed and learn ways to manage it, such as with exercise. 7. Take medicines only as directed by your health care provider. Over-the-counter medicines to reduce pain and inflammation are often the most helpful.Your health care provider may prescribe muscle relaxant drugs.These medicines help dull your pain so you can more quickly return to your normal activities and healthy exercise. 8. Apply ice to the injured area: 1. Put ice in a plastic bag. 2. Place a towel between your skin and the bag. 3. Leave the ice on for 20 minutes, 2-3 times a day for the first 2-3 days. After that, ice and heat may be alternated to reduce pain and spasms. 9. Maintain a healthy weight. Excess weight puts extra stress on your back and makes it difficult to maintain good posture. SEEK MEDICAL CARE IF: 1. You have pain that is not relieved with rest or medicine. 2. You have increasing pain going down into the legs or buttocks. 3. You have pain that does not improve in one week. 4. You have night pain. 5. You lose weight. 6. You have a fever or chills. SEEK IMMEDIATE MEDICAL CARE IF:  1. You develop new bowel or bladder control problems. 2. You have unusual weakness or numbness in  your arms or legs. 3. You develop nausea or vomiting. 4. You develop abdominal pain. 5. You feel faint.   This information is not intended to replace advice given to you by your health care provider. Make sure you discuss any questions you have with your health care provider.   Document Released: 01/06/2005 Document Revised: 01/27/2014 Document Reviewed: 05/10/2013 Elsevier Interactive Patient Education 2016 Elsevier Inc.  Back Exercises The following exercises strengthen the muscles that help to support the  back. They also help to keep the lower back flexible. Doing these exercises can help to prevent back pain or lessen existing pain. If you have back pain or discomfort, try doing these exercises 2-3 times each day or as told by your health care provider. When the pain goes away, do them once each day, but increase the number of times that you repeat the steps for each exercise (do more repetitions). If you do not have back pain or discomfort, do these exercises once each day or as told by your health care provider. EXERCISES Single Knee to Chest Repeat these steps 3-5 times for each leg: 5. Lie on your back on a firm bed or the floor with your legs extended. 6. Bring one knee to your chest. Your other leg should stay extended and in contact with the floor. 7. Hold your knee in place by grabbing your knee or thigh. 8. Pull on your knee until you feel a gentle stretch in your lower back. 9. Hold the stretch for 10-30 seconds. 10. Slowly release and straighten your leg. Pelvic Tilt Repeat these steps 5-10 times: 10. Lie on your back on a firm bed or the floor with your legs extended. [place] your knees so they are pointing toward the ceiling and your feet are flat on the floor. 12. Tighten your lower abdominal muscles to press your lower back against the floor. This motion will tilt your pelvis so your tailbone points up toward the ceiling instead of pointing to your feet or the floor. 13. With gentle tension and even breathing, hold this position for 5-10 seconds. Cat-Cow Repeat these steps until your lower back becomes more flexible: 7. Get into a hands-and-knees position on a firm surface. Keep your hands under your shoulders, and keep your knees under your hips. You may place padding under your knees for comfort. 8. Let your head hang down, and point your tailbone toward the floor so your lower back becomes rounded like the back of a cat. 9. Hold this position for 5 seconds. 10. Slowly lift  your head and point your tailbone up toward the ceiling so your back forms a sagging arch like the back of a cow. 11. Hold this position for 5 seconds. Press-Ups Repeat these steps 5-10 times: 6. Lie on your abdomen (face-down) on the floor. 7. Place your palms near your head, about shoulder-width apart. 8. While you keep your back as relaxed as possible and keep your hips on the floor, slowly straighten your arms to raise the top half of your body and lift your shoulders. Do not use your back muscles to raise your upper torso. You may adjust the placement of your hands to make yourself more comfortable. 9. Hold this position for 5 seconds while you keep your back relaxed. 10. Slowly return to lying flat on the floor. Bridges Repeat these steps 10 times: 1. Lie on your back on a firm surface. 2. Bend your knees so they are pointing toward the ceiling and  your feet are flat on the floor. 3. Tighten your buttocks muscles and lift your buttocks off of the floor until your waist is at almost the same height as your knees. You should feel the muscles working in your buttocks and the back of your thighs. If you do not feel these muscles, slide your feet 1-2 inches farther away from your buttocks. 4. Hold this position for 3-5 seconds. 5. Slowly lower your hips to the starting position, and allow your buttocks muscles to relax completely. If this exercise is too easy, try doing it with your arms crossed over your chest. Abdominal Crunches Repeat these steps 5-10 times: 1. Lie on your back on a firm bed or the floor with your legs extended. 2. Bend your knees so they are pointing toward the ceiling and your feet are flat on the floor. 3. Cross your arms over your chest. 4. Tip your chin slightly toward your chest without bending your neck. 5. Tighten your abdominal muscles and slowly raise your trunk (torso) high enough to lift your shoulder blades a tiny bit off of the floor. Avoid raising your torso  higher than that, because it can put too much stress on your low back and it does not help to strengthen your abdominal muscles. 6. Slowly return to your starting position. Back Lifts Repeat these steps 5-10 times: 1. Lie on your abdomen (face-down) with your arms at your sides, and rest your forehead on the floor. 2. Tighten the muscles in your legs and your buttocks. 3. Slowly lift your chest off of the floor while you keep your hips pressed to the floor. Keep the back of your head in line with the curve in your back. Your eyes should be looking at the floor. 4. Hold this position for 3-5 seconds. 5. Slowly return to your starting position. SEEK MEDICAL CARE IF:  Your back pain or discomfort gets much worse when you do an exercise.  Your back pain or discomfort does not lessen within 2 hours after you exercise. If you have any of these problems, stop doing these exercises right away. Do not do them again unless your health care provider says that you can. SEEK IMMEDIATE MEDICAL CARE IF:  You develop sudden, severe back pain. If this happens, stop doing the exercises right away. Do not do them again unless your health care provider says that you can.   This information is not intended to replace advice given to you by your health care provider. Make sure you discuss any questions you have with your health care provider.   Document Released: 02/14/2004 Document Revised: 09/27/2014 Document Reviewed: 03/02/2014 Elsevier Interactive Patient Education 2016 [employer].   Emergency Department Resource Guide 1) Find a Doctor and Pay Out of Pocket Although you won't have to find out who is covered by your insurance plan, it is a good idea to ask around and get recommendations. You will then need to call the office and see if the doctor you have chosen will accept you as a new patient and what types of options they offer for patients who are self-pay. Some doctors offer discounts or will set up  payment plans for their patients who do not have insurance, but you will need to ask so you aren't surprised when you get to your appointment.  2) Contact Your Local Health Department Not all health departments have doctors that can see patients for sick visits, but many do, so it is worth a call to  see if yours does. If you don't know where your local health department is, you can check in your phone book. The CDC also has a tool to help you locate your state's health department, and many state websites also have listings of all of their local health departments.  3) Find a [facility] If your illness is not likely to be very severe or complicated, you may want to try a walk in clinic. These are popping up all over the country in pharmacies, drugstores, and shopping centers. They're usually staffed by nurse practitioners or physician assistants that have been trained to treat common illnesses and complaints. They're usually fairly quick and inexpensive. However, if you have serious medical issues or chronic medical problems, these are probably not your best option.  No Primary Care Doctor: - Call Health Connect at  [phone] - they can help you locate a primary care doctor that  accepts your insurance, provides certain services, etc. - Physician Referral Service- [phone]  Chronic Pain Problems: Organization         Address  Phone   Notes  [facility]  [phone] Patients need to be referred by their primary care doctor.   Medication Assistance: Organization         Address  Phone   Notes  [facility] [address] [place], [place] [address] [phone] --Must be a resident of [facility] -- Must have NO insurance coverage whatsoever (no Medicaid/ Medicare, etc.) -- The pt. MUST have a primary care doctor that directs their care regularly and follows them in the community   Med[facility]ist  [phone]   [employer]  [phone]    Agencies that provide inexpensive medical care: Organization         Address  Phone   Notes  [place]  [phone]   [facility]    [phone]   [facility] [place], [place] [address] [phone]   [place] [address], [place] [phone]   Planned Parenthood    [phone]   [facility]    [phone]   [place] and [address], [place] Phone:  [phone], Fax:  [phone] Hours of Operation:  9 am - 6 pm, M-F.  Also accepts Medicaid/Medicare and self-pay.  [facility] for [place], [address], [place] Phone: [phone], Fax: [phone]. Hours of Operation:  8:30 am - 5:30 pm, M-F.  Also accepts Medicaid and self-pay.  [facility] [place] [address], [place] Phone: [phone]   [place], [place], [place] [phone], Ext. [phone] Mondays & Thursdays: 7-9 AM.  First 15 patients are seen on a first come, first serve basis.    [place] Providers:  Organization         Address  Phone   Notes  [facility] [address], [place] [phone] Also accepts self-pay patients.  [place], [place]  [phone]   [place], [address], [place] [phone]   [place] [address], [place] [phone]   Lucianne Lei [address], [place]   [phone] Only accepts [place] Access Medicaid patients after they have their name applied to their card.   Self-Pay (no insurance) in [facility]:  Organization         Address  Phone   Notes  Sickle Cell Patients, [facility] [place]  [phone]   [facility] [place] [phone]   [facility] [place]  [place], [place], [place] [phone]   [facility]/Dr. Osei-Bonsu  [address], [place] or [address], [place] [phone] Phone number for both [place] and [place] locations is the same.  [facility] [address], [place] [phone]   [facility] [address], [place] or [address] [phone]   [facility] [address], [place] [phone], phone; [phone], fax Sees patients 1st and 3rd Saturday of every month.  Must not qualify for public or private insurance (i.e. Medicaid, Medicare, [place] Health Choice, Veterans' Benefits)  Household income should be no more than 200% of the poverty level The clinic cannot treat you if you are pregnant or think you are pregnant  Sexually transmitted diseases are not treated at the clinic.    Dental Care: Organization         Address  Phone  Notes  [facility] Department of [facility] [place] [phone] Accepts children up to age 55 who are enrolled in [place] or [place]; pregnant women with a Medicaid card; and children who have applied for Medicaid or [place] Health Choice, but were declined, whose parents can pay a reduced fee at time of service.  [facility] Department of [facility]  [address], [place] [phone] Accepts children up to age 41 who are enrolled in [place] or [place]; pregnant women with a Medicaid card; and children who have applied for Medicaid or [place] Health Choice, but were declined, whose parents can pay a reduced fee at time of service.  [place] Adult Dental Access PROGRAM  [place] [phone] Patients  are seen by appointment only. Walk-ins are not accepted. [place] will see patients 87 years of age and older. Monday - Tuesday (8am-5pm) Most Wednesdays (8:30-5pm) $30 per visit, cash only  [facility] Adult Dental Access PROGRAM  [address], [facility] [phone] Patients are seen by appointment only. Walk-ins are not accepted. [place] will see patients 84 years of age and older. One Wednesday Evening (Monthly: Volunteer Based).  $30 per visit, cash only  [place]  [phone] for adults; Children under age 56, call Graduate Pediatric Dentistry at [phone]. Children aged 21-14, please call [phone] to request a pediatric application.  Dental services are provided in all areas of dental care including fillings, crowns and bridges, complete and partial dentures, implants, gum treatment, root canals, and extractions. Preventive care is also provided. Treatment is provided to both adults and children. Patients are selected via a lottery and there is often a waiting list.   [facility] [address], [place]  [phone] [email or web address], [place], [place] [phone], Ext. [phone] Second and Fourth Thursday of each month, opens at 6:30  AM; Clinic ends at 9 AM.  Patients are seen on a first-come first-served basis, and a limited number are seen during each clinic.   [facility]  [address] Hillard Danker [place], [place] [phone]   Eligibility Requirements You must have lived in [place], [place], or [place] counties for at least the last three months.   You cannot be eligible for state or federal sponsored [employer], including [employer], [place], or [employer].   You generally cannot be eligible for healthcare insurance through your employer.    How to apply: Eligibility screenings are held every Tuesday and Wednesday afternoon from 1:00 pm until  4:00 pm. You do not need an appointment for the interview!  [facility] [address] [place], [place]   [place]  [place] Department  [place]  [phone]    Behavioral Health Resources in the Community: Intensive Outpatient Programs Organization         Address  Phone  Notes  Secretary [place]. [address], [place], [place] [phone]   [facility] [address], [place], [place]   ADS: Alcohol & Drug Svcs [address], [place], [place]   [place] [address]  [place], [place] or [phone]   Substance Abuse Resources Organization         Address  Phone  Notes  Alcohol and Drug Services  [phone]   [place]  [phone]   [facility] [place]   Chinita Pester  [phone]   Residential & Outpatient Substance Abuse Program  [phone]   Psychological Services Organization         Address  Phone  Notes  [facility] [place]  [place]  [phone]   [place] [address], [place] or [phone]    Mobile Crisis Teams Organization         Address  Phone  Notes  [facility], Mobile Crisis Care Unit  [phone]   [facility]  [address] [place], [place]   [place] [address], [place] [phone]    Self-Help/Support Groups Organization         Address  Phone             Notes  [place]. of [place] - variety of support groups  [place] Call for more information  Narcotics Anonymous (NA), [facility] [address], [employer] [place]  2 meetings at this location   Special educational needs teacher          Address  Phone  Notes  [facility] [place],    [place]  [phone]   [facility]  [address] [place] [phone], [place], [place]   [place], [place] [phone] Admissions: 8am-3pm M-F  Incentives Substance [place] [address]    [place], [place] [phone]   [facility] [address] Jadene Pierini [place], [place]   [facility] [address],  [place], [place]   [facility] Programs - Intensive Outpatient [address], [place], [place]   [facility] ([place] [address] [place], [place] or [phone]  [facility] Services (RTS) [address] [place], [place] Accepts Medicaid  [facility] [place] [address],  [place] [phone] Substance Abuse/Addiction Treatment   [facility] Organization         Address  Phone  Notes  [facility]  [phone]   Domenic Schwab, PhD [address] Arlis Porta [place], [place]   [phone] or [phone]   [place], [place] [phone]   [place] [address], [place], [place] [phone] Insurance/Medicaid/sponsorship through [facility] Families [address], [address] [place]                                    [place], [place] [phone] [place] [address], [place] [phone]    Dr. Adele Schilder  [phone]   [facility] Dept. 1) [address], [place] 2) [place] 3)  [place] [address], [place] [phone]  [phone]   [phone] or [phone] (After Hours)

## 2015-03-01 ENCOUNTER — Telehealth: Payer: Self-pay | Admitting: *Deleted

## 2015-03-01 NOTE — Telephone Encounter (Signed)
Called patient to discuss receiving flu vaccine Discussed importance of receiving flu vaccine Patient advised to receive flu vaccine at local pharmacy since she doesn't have health insurance. Patient will notify us when and where if she decides to get it. Velora Heckler, RN

## 2015-08-21 ENCOUNTER — Ambulatory Visit (INDEPENDENT_AMBULATORY_CARE_PROVIDER_SITE_OTHER): Payer: Self-pay | Admitting: Family Medicine

## 2015-08-21 ENCOUNTER — Other Ambulatory Visit (HOSPITAL_COMMUNITY)
Admission: RE | Admit: 2015-08-21 | Discharge: 2015-08-21 | Disposition: A | Discharge status: Home / Self Care | Payer: Self-pay | Source: Ambulatory Visit | Attending: Family Medicine | Admitting: Family Medicine

## 2015-08-21 ENCOUNTER — Encounter: Payer: Self-pay | Admitting: Family Medicine

## 2015-08-21 VITALS — BP 141/75 | HR 60 | Temp 98.5°F | Ht 65.0 in | Wt 223.0 lb

## 2015-08-21 DIAGNOSIS — N950 Postmenopausal bleeding: Secondary | ICD-10-CM

## 2015-08-21 DIAGNOSIS — Z862 Personal history of diseases of the blood and blood-forming organs and certain disorders involving the immune mechanism: Secondary | ICD-10-CM

## 2015-08-21 DIAGNOSIS — Z124 Encounter for screening for malignant neoplasm of cervix: Secondary | ICD-10-CM

## 2015-08-21 DIAGNOSIS — Z01419 Encounter for gynecological examination (general) (routine) without abnormal findings: Secondary | ICD-10-CM | POA: Insufficient documentation

## 2015-08-21 DIAGNOSIS — Z113 Encounter for screening for infections with a predominantly sexual mode of transmission: Secondary | ICD-10-CM | POA: Insufficient documentation

## 2015-08-21 DIAGNOSIS — E0590 Thyrotoxicosis, unspecified without thyrotoxic crisis or storm: Secondary | ICD-10-CM

## 2015-08-21 DIAGNOSIS — I10 Essential (primary) hypertension: Secondary | ICD-10-CM

## 2015-08-21 DIAGNOSIS — Z1151 Encounter for screening for human papillomavirus (HPV): Secondary | ICD-10-CM | POA: Insufficient documentation

## 2015-08-21 NOTE — Patient Instructions (Signed)
I have ordered labs for you to have done FASTING (no food for 8 hours).  I have also ordered a pelvic ultrasound to evaluate your post menopausal bleeding.  I will contact you will the results of your labs.  If anything is abnormal, I will call you.  Otherwise, expect a copy to be mailed to you.

## 2015-08-21 NOTE — Progress Notes (Signed)
[name] is a 50 y.o. female presents to office today for annual physical exam examination.  Concerns today include:  1. Post menopausal bleeding Patient reports that she had 4-5 days of heavy vaginal bleeding in June.  She reports that she had been amenorrheic for 12 consecutive months preceding this.  She is worried about this new bleeding.  No history of ovarian/ uterine or breast cancers.  She has had no bleeding since then and is not bleeding from any other orifice.  She is a nonsmoker.  Last eye exam: <1 year ago, wears glasses Last dental exam: <1 year ago Last colonoscopy: never Last mammogram: 01/2013, normal Last pap smear: 07/2012, WNL Immunizations needed: flu Refills needed today: none  Past Medical History:  Diagnosis Date  . Cough due to angiotensin-converting enzyme inhibitor 05/27/2010  . Diastolic heart failure [id number]   NEW ON ECHO 02/2010  . Hemoptysis 02/2010   DUE TO ALV HGE.Marland KitchenMarland KitchenANA NEGATIVE...DEEMED DUE TO DIASTOLIC CHF  . Hypertension    DX SEVERAL DECADES YRS AGO- UNTRREATED UNTIL ADMISSION 02/2010  . Hyperthyroidism   . PPD positive    NEGATIVE DURING CHILDHOOD IN [facility]..1ST + TEST 1993 DURING IMMIGRATION GRREN CARD CLEARENCE.Marland KitchenCXR NORMAL 1993 PER HX..s/p INR Rx AT [facility] [id number].Marland KitchenREPEAT + 02/2010   Social History   Social History  . Marital status: Married    Spouse name: N/A  . Number of children: N/A  . Years of education: N/A   Occupational History  . Not on file.   Social History Main Topics  . Smoking status: Never Smoker  . Smokeless tobacco: Never Used  . Alcohol use No  . Drug use: No  . Sexual activity: Yes    Birth control/ protection: Surgical, Post-menopausal   Other Topics Concern  . Not on file   Social History Narrative  . No narrative on file   Past Surgical History:  Procedure Laterality Date  . CESAREAN SECTION     Family History  Problem Relation Age of Onset  . Hypertension Father   . Sudden death  Father   . Hyperlipidemia Neg Hx   . Heart attack Neg Hx   . Diabetes Neg Hx     ROS: Review of Systems Constitutional: negative Eyes: positive for contacts/glasses Ears, nose, mouth, throat, and face: negative Respiratory: negative Cardiovascular: negative Gastrointestinal: negative Genitourinary:positive for post menopausal bleeding Integument/breast: negative Hematologic/lymphatic: negative Musculoskeletal:positive for arthralgias and back pain Neurological: negative Behavioral/Psych: positive for anxiety and depression Endocrine: negative Allergic/Immunologic: negative   Physical exam BP (!) 141/75 (BP Location: Left Arm, Patient Position: Sitting, Cuff Size: Large)   Pulse 60   Temp 98.5 F (36.9 C) (Oral)   Ht 5\' 5"  (1.651 m)   Wt 223 lb (101.2 kg)   BMI 37.11 kg/m  General appearance: alert, cooperative, appears stated age and no distress Head: Normocephalic, without obvious abnormality, atraumatic Eyes: sclera white, PERRL, EOMI Ears: normal TM's and external ear canals both ears Nose: Nares normal. Septum midline. Mucosa normal. No drainage or sinus tenderness. Throat: lips, mucosa, and tongue normal; teeth and gums normal Neck: no adenopathy, supple, symmetrical, trachea midline and thyroid not enlarged, symmetric, no tenderness/mass/nodules Back: symmetric, no curvature. ROM normal. No CVA tenderness. Lungs: clear to auscultation bilaterally Heart: regular rate and rhythm, S1, S2 normal, no murmur, click, rub or gallop Abdomen: soft, non-tender; bowel sounds normal; no masses,  no organomegaly Pelvic: cervix normal in appearance, external genitalia normal, no adnexal masses or tenderness, no cervical  motion tenderness, rectovaginal septum normal, uterus normal size, shape, and consistency and vagina normal without discharge Extremities: extremities normal, atraumatic, no cyanosis or edema Pulses: 2+ and symmetric Skin: Skin color, texture, turgor normal. No  rashes or lesions Lymph nodes: Cervical, supraclavicular, and axillary nodes normal. Neurologic: Alert and oriented X 3, normal strength and tone. Normal symmetric reflexes. Normal coordination and gait   Depression screen PHQ 2/9 08/21/2015  Decreased Interest 3  Down, Depressed, Hopeless 2  PHQ - 2 Score 5  Altered sleeping 3  Tired, decreased energy 3  Change in appetite 3  Feeling bad or failure about yourself  3  Trouble concentrating 3  Moving slowly or fidgety/restless 0  Suicidal thoughts 0  PHQ-9 Score 20  Difficult doing work/chores Somewhat difficult    Assessment/ Plan: [name] is here for her annual exam  1. Well woman exam with routine gynecological exam - Pap w/ HPV and GC/CT testing - History updated - Colonoscopy and Mammogram information provided  2. Screening for cervical cancer - Cytology - PAP  3. Essential hypertension, SBP slightly elevated.   - Lipid panel; Future - BASIC METABOLIC PANEL WITH GFR; Future - Recheck before discharge from office - Will consider adjusting BP meds if persistently elevated  4. Hyperthyroidism - TSH; Future  5. History of anemia.  On ferrous sulfate - CBC; Future  6. Morbid obesity due to excess calories ([place]) - Lifestyle changes encouraged - Lipid panel; Future - VITAMIN D 25 Hydroxy (Vit-D Deficiency, Fractures); Future  7. Post-menopausal bleeding.  It sounds like patient truly went through menopause.  Will evaluate for pathology. - US Transvaginal Non-OB; Future - US Pelvis Complete; Future - Will f/u with patient. - If u/s abnormal will need to proceed with endometrial biopsy/ possible referral to GYN.  Follow up in 1 year or sooner if needed.   [name] M. [name], DO PGY-3, [facility]

## 2015-08-22 ENCOUNTER — Other Ambulatory Visit: Payer: Self-pay

## 2015-08-22 DIAGNOSIS — E0590 Thyrotoxicosis, unspecified without thyrotoxic crisis or storm: Secondary | ICD-10-CM

## 2015-08-22 DIAGNOSIS — Z862 Personal history of diseases of the blood and blood-forming organs and certain disorders involving the immune mechanism: Secondary | ICD-10-CM

## 2015-08-22 DIAGNOSIS — I10 Essential (primary) hypertension: Secondary | ICD-10-CM

## 2015-08-22 LAB — LIPID PANEL
CHOL/HDL RATIO: 3.3 ratio (ref <=5.0)
CHOLESTEROL: 180 mg/dL (ref 125–200)
HDL: 54 mg/dL (ref >=46)
LDL CALC: 112 mg/dL (ref <130)
TRIGLYCERIDES: 68 mg/dL (ref <150)
VLDL: 14 mg/dL (ref <30)

## 2015-08-22 LAB — BASIC METABOLIC PANEL WITH GFR
BUN: 15 mg/dL (ref 7–25)
CALCIUM: 8.9 mg/dL (ref 8.6–10.4)
CHLORIDE: 108 mmol/L (ref 98–110)
CO2: 26 mmol/L (ref 20–31)
CREATININE: 0.84 mg/dL (ref 0.50–1.05)
GFR, Est Non African American: 81 mL/min (ref >=60)
Glucose, Bld: 102 mg/dL — ABNORMAL HIGH (ref 65–99)
Potassium: 3.9 mmol/L (ref 3.5–5.3)
SODIUM: 142 mmol/L (ref 135–146)

## 2015-08-22 LAB — CBC
HCT: 36.3 % (ref 35.0–45.0)
Hemoglobin: 11.3 g/dL — ABNORMAL LOW (ref 11.7–15.5)
MCH: 26.5 pg — AB (ref 27.0–33.0)
MCHC: 31.1 g/dL — AB (ref 32.0–36.0)
MCV: 85 fL (ref 80.0–100.0)
MPV: 9.6 fL (ref 7.5–12.5)
PLATELETS: 262 10*3/uL (ref 140–400)
RBC: 4.27 MIL/uL (ref 3.80–5.10)
RDW: 14.2 % (ref 11.0–15.0)
WBC: 4.4 10*3/uL (ref 3.8–10.8)

## 2015-08-22 LAB — TSH: TSH: 1.33 m[IU]/L

## 2015-08-22 LAB — CYTOLOGY - PAP

## 2015-08-23 LAB — VITAMIN D 25 HYDROXY (VIT D DEFICIENCY, FRACTURES): VIT D 25 HYDROXY: 16 ng/mL — AB (ref 30–100)

## 2015-08-24 ENCOUNTER — Other Ambulatory Visit: Payer: Self-pay | Admitting: Family Medicine

## 2015-08-24 ENCOUNTER — Encounter: Payer: Self-pay | Admitting: Family Medicine

## 2015-08-24 DIAGNOSIS — E785 Hyperlipidemia, unspecified: Secondary | ICD-10-CM

## 2015-08-24 DIAGNOSIS — E559 Vitamin D deficiency, unspecified: Secondary | ICD-10-CM

## 2015-08-24 MED ORDER — VITAMIN D (ERGOCALCIFEROL) 1.25 MG (50000 UNIT) PO CAPS: 50000.0000 [IU] | ORAL_CAPSULE | ORAL | 0 refills | Status: DC

## 2015-08-24 MED ORDER — ATORVASTATIN CALCIUM 40 MG PO TABS: 40.0000 mg | ORAL_TABLET | Freq: Every day | ORAL | 3 refills | Status: DC

## 2015-08-27 ENCOUNTER — Ambulatory Visit (HOSPITAL_COMMUNITY): Payer: Self-pay

## 2015-09-04 ENCOUNTER — Ambulatory Visit (HOSPITAL_COMMUNITY)
Admission: RE | Admit: 2015-09-04 | Discharge: 2015-09-04 | Disposition: A | Discharge status: Home / Self Care | Payer: Self-pay | Source: Ambulatory Visit | Attending: Family Medicine | Admitting: Family Medicine

## 2015-09-04 DIAGNOSIS — D259 Leiomyoma of uterus, unspecified: Secondary | ICD-10-CM | POA: Insufficient documentation

## 2015-09-04 DIAGNOSIS — R140 Abdominal distension (gaseous): Secondary | ICD-10-CM | POA: Insufficient documentation

## 2015-09-04 DIAGNOSIS — N950 Postmenopausal bleeding: Secondary | ICD-10-CM | POA: Insufficient documentation

## 2015-09-05 ENCOUNTER — Telehealth: Payer: Self-pay | Admitting: *Deleted

## 2015-09-05 NOTE — Telephone Encounter (Signed)
-----   Message from Janora Norlander, DO sent at 08/24/2015  5:54 AM EDT ----- Please call patient to let her know pap was normal.  Cholesterol high.  Cardiac risk 5%, so Lipitor sent to pharmacy.  Vitamin D level low, 8 week treatment sent in.  She will need to return in 2 months to have rechecked.  Results letter sent to home.

## 2015-09-05 NOTE — Telephone Encounter (Signed)
Tried to contact pt to be sure she was aware of her results and there was no option to LVM, phone only rang.  If pt calls back please give her the information below. Katharina Caper, Loza Prell D, [place]

## 2015-09-20 ENCOUNTER — Encounter: Payer: Self-pay | Admitting: Family Medicine

## 2015-09-20 ENCOUNTER — Ambulatory Visit (INDEPENDENT_AMBULATORY_CARE_PROVIDER_SITE_OTHER): Payer: Self-pay | Admitting: Family Medicine

## 2015-09-20 VITALS — BP 148/81 | HR 64 | Temp 98.1°F | Ht 65.0 in | Wt 226.6 lb

## 2015-09-20 DIAGNOSIS — N950 Postmenopausal bleeding: Secondary | ICD-10-CM

## 2015-09-20 DIAGNOSIS — N924 Excessive bleeding in the premenopausal period: Secondary | ICD-10-CM

## 2015-09-20 LAB — POCT URINE PREGNANCY: Preg Test, Ur: NEGATIVE

## 2015-09-20 NOTE — Patient Instructions (Signed)

## 2015-09-20 NOTE — Progress Notes (Signed)
Endometrial Biopsy Procedure Note  Pre-operative Diagnosis: Menorrhagia  Post-operative Diagnosis: same  Indications: postmenopausal bleeding. She has been menopausal for about 12 months and then she had a period in June for few days which was pretty heavy. She has not had any bleeding since then, denies abdominal pain. Not on blood thinner.  Procedure Details   Urine pregnancy test was done today and result was neg.  The risks (including infection, bleeding, pain, and uterine perforation) and benefits of the procedure were explained to the patient and verbal and written informed consent was obtained.  Antibiotic prophylaxis against endocarditis was not indicated.   The patient was placed in the dorsal lithotomy position.  Bimanual exam showed the uterus to be in the neutral position.  A Graves' speculum inserted in the vagina, and the cervix prepped with povidone iodine.  Endocervical curettage with a Kevorkian curette was not performed.   A sharp tenaculum was applied to the anterior lip of the cervix for stabilization.  A sterile uterine sound was used to sound the uterus to a depth of 7cm.  A Novak Curette was used to sample the endometrium.  Sample was sent for pathologic examination.  Condition: Stable  Complications: None  Plan:  The patient was advised to call for any fever or for prolonged or severe pain or bleeding. She was advised to use OTC acetaminophen as needed for mild to moderate pain. She was advised to avoid vaginal intercourse for 48 hours or until the bleeding has completely stopped.  Attending Physician Documentation: I performed this procedure. Andrena Mews, MD, MPH

## 2015-09-21 ENCOUNTER — Telehealth: Payer: Self-pay | Admitting: Family Medicine

## 2015-09-21 NOTE — Telephone Encounter (Signed)
Endometrial biopsy report discussed with patient.

## 2015-10-03 ENCOUNTER — Other Ambulatory Visit: Payer: Self-pay | Admitting: Family Medicine

## 2015-11-03 ENCOUNTER — Other Ambulatory Visit: Payer: Self-pay | Admitting: Family Medicine

## 2015-11-03 DIAGNOSIS — I10 Essential (primary) hypertension: Secondary | ICD-10-CM

## 2015-11-03 DIAGNOSIS — Z862 Personal history of diseases of the blood and blood-forming organs and certain disorders involving the immune mechanism: Secondary | ICD-10-CM

## 2015-12-24 ENCOUNTER — Other Ambulatory Visit: Payer: Self-pay | Admitting: Family Medicine

## 2015-12-24 MED ORDER — MELOXICAM 15 MG PO TABS: 15.0000 mg | ORAL_TABLET | Freq: Every day | ORAL | 3 refills | Status: DC

## 2015-12-24 MED ORDER — OMEPRAZOLE 20 MG PO CPDR: DELAYED_RELEASE_CAPSULE | ORAL | 0 refills | Status: DC

## 2015-12-24 NOTE — Telephone Encounter (Signed)
Patient came to office needs refill on RX Meloxicam & Prilosec. Please let patient know when done. [facility] on [address].

## 2016-01-02 ENCOUNTER — Other Ambulatory Visit: Payer: Self-pay | Admitting: Family Medicine

## 2016-02-13 ENCOUNTER — Other Ambulatory Visit: Payer: Self-pay | Admitting: Family Medicine

## 2016-02-13 DIAGNOSIS — I10 Essential (primary) hypertension: Secondary | ICD-10-CM

## 2016-03-14 ENCOUNTER — Other Ambulatory Visit: Payer: Self-pay | Admitting: Family Medicine

## 2016-03-14 DIAGNOSIS — I10 Essential (primary) hypertension: Secondary | ICD-10-CM

## 2016-03-14 DIAGNOSIS — Z862 Personal history of diseases of the blood and blood-forming organs and certain disorders involving the immune mechanism: Secondary | ICD-10-CM

## 2016-03-17 ENCOUNTER — Other Ambulatory Visit: Payer: Self-pay | Admitting: Family Medicine

## 2016-03-17 ENCOUNTER — Telehealth: Payer: Self-pay | Admitting: Family Medicine

## 2016-03-17 DIAGNOSIS — I10 Essential (primary) hypertension: Secondary | ICD-10-CM

## 2016-03-17 DIAGNOSIS — Z862 Personal history of diseases of the blood and blood-forming organs and certain disorders involving the immune mechanism: Secondary | ICD-10-CM

## 2016-03-17 NOTE — Telephone Encounter (Signed)
Pt needs refills on amlodipine, furosemide, and ferrous sulfate. Pt uses [facility] on [address]. ep

## 2016-03-18 ENCOUNTER — Encounter: Payer: Self-pay | Admitting: Family Medicine

## 2016-03-18 ENCOUNTER — Ambulatory Visit (INDEPENDENT_AMBULATORY_CARE_PROVIDER_SITE_OTHER): Payer: Self-pay | Admitting: Family Medicine

## 2016-03-18 ENCOUNTER — Ambulatory Visit (HOSPITAL_COMMUNITY)
Admission: RE | Admit: 2016-03-18 | Discharge: 2016-03-18 | Disposition: A | Discharge status: Home / Self Care | Payer: Self-pay | Source: Ambulatory Visit | Attending: Family Medicine | Admitting: Family Medicine

## 2016-03-18 VITALS — BP 114/70 | HR 74 | Temp 98.7°F | Wt 223.0 lb

## 2016-03-18 DIAGNOSIS — R052 Subacute cough: Secondary | ICD-10-CM

## 2016-03-18 DIAGNOSIS — R059 Cough, unspecified: Secondary | ICD-10-CM

## 2016-03-18 DIAGNOSIS — R05 Cough: Secondary | ICD-10-CM

## 2016-03-18 MED ORDER — AMLODIPINE BESYLATE 5 MG PO TABS: ORAL_TABLET | ORAL | 2 refills | Status: DC

## 2016-03-18 MED ORDER — ALBUTEROL SULFATE HFA 108 (90 BASE) MCG/ACT IN AERS
2.0000 | INHALATION_SPRAY | Freq: Four times a day (QID) | RESPIRATORY_TRACT | 0 refills | Status: DC | PRN
Start: 2016-03-18 — End: 2016-09-24

## 2016-03-18 MED ORDER — FUROSEMIDE 20 MG PO TABS: ORAL_TABLET | ORAL | 0 refills | Status: DC

## 2016-03-18 MED ORDER — FERROUS SULFATE 325 (65 FE) MG PO TABS: ORAL_TABLET | ORAL | 0 refills | Status: DC

## 2016-03-18 MED ORDER — BENZONATATE 100 MG PO CAPS: 100.0000 mg | ORAL_CAPSULE | Freq: Two times a day (BID) | ORAL | 0 refills | Status: DC | PRN

## 2016-03-18 NOTE — Patient Instructions (Signed)

## 2016-03-18 NOTE — Progress Notes (Addendum)
Subjective:     Patient ID: [name], female   DOB: 06-Jan-1966, 51 y.o.   MRN: [id number]  Cough  This is a new problem. The current episode started 1 to 4 weeks ago (Coughing for 1 month). The problem has been gradually worsening. The problem occurs constantly. The cough is productive of sputum (Thick yellowish sputum, no blood in it. She coughs out sputum on and off). Associated symptoms include chest pain, shortness of breath, weight loss and wheezing. Pertinent negatives include no fever, hemoptysis, nasal congestion or rash. Associated symptoms comments: She felt warm few days ago but did not measure her temp.. Nothing aggravates the symptoms. Risk factors: Denies hx of smoking. She has tried OTC cough suppressant (Home remedies, cough drops) for the symptoms. Her past medical history is significant for bronchitis. There is no history of environmental allergies or pneumonia.   Current Outpatient Prescriptions on File Prior to Visit  Medication Sig Dispense Refill  . amLODipine (NORVASC) 5 MG tablet TAKE 1 TABLET(5 MG) BY MOUTH DAILY 90 tablet 2  . ferrous sulfate 325 (65 FE) MG tablet TAKE 1 TABLET(325 MG) BY MOUTH DAILY WITH BREAKFAST 90 tablet 0  . furosemide (LASIX) 20 MG tablet TAKE 1 TABLET(20 MG) BY MOUTH DAILY 90 tablet 0  . atorvastatin (LIPITOR) 40 MG tablet Take 1 tablet (40 mg total) by mouth daily. (Patient not taking: Reported on 03/18/2016) 90 tablet 3  . meloxicam (MOBIC) 15 MG tablet Take 1 tablet (15 mg total) by mouth daily. 90 tablet 3  . metoprolol (LOPRESSOR) 50 MG tablet TAKE 1 TABLET(50 MG) BY MOUTH TWICE DAILY 180 tablet 3  . omeprazole (PRILOSEC) 20 MG capsule TAKE 1 CAPSULE(20 MG) BY MOUTH DAILY 90 capsule 0  . Vitamin D, Ergocalciferol, (DRISDOL) 50000 units CAPS capsule Take 1 capsule (50,000 Units total) by mouth every 7 (seven) days. 8 capsule 0   No current facility-administered medications on file prior to visit.    Past Medical History:  Diagnosis Date  .  Cough due to angiotensin-converting enzyme inhibitor 05/27/2010  . Diastolic heart failure Q000111Q   NEW ON ECHO 02/2010  . Hemoptysis 02/2010   DUE TO ALV HGE.Marland KitchenMarland KitchenANA NEGATIVE...DEEMED DUE TO DIASTOLIC CHF  . Hypertension    DX SEVERAL DECADES YRS AGO- UNTRREATED UNTIL ADMISSION 02/2010  . Hyperthyroidism   . PPD positive    NEGATIVE DURING CHILDHOOD IN [facility]..1ST + TEST 1993 DURING IMMIGRATION GRREN CARD CLEARENCE.Marland KitchenCXR NORMAL 1993 PER HX..s/p INR Rx AT [facility] [id number].Marland KitchenREPEAT + 02/2010   Vitals:   03/18/16 1107  BP: 114/70  Pulse: 74  Temp: 98.7 F (37.1 C)  TempSrc: Oral  SpO2: 97%  Weight: 223 lb (101.2 kg)     Review of Systems  Constitutional: Positive for weight loss. Negative for fever.  Respiratory: Positive for cough, shortness of breath and wheezing. Negative for hemoptysis.   Cardiovascular: Positive for chest pain.  Gastrointestinal: Negative.   Genitourinary: Negative.   Skin: Negative for rash.  Allergic/Immunologic: Negative for environmental allergies.  All other systems reviewed and are negative.      Objective:   Physical Exam  Constitutional: She is oriented to person, place, and time. She appears well-developed. No distress.  Cardiovascular: Normal rate, regular rhythm and normal heart sounds.   No murmur heard. Pulmonary/Chest: Effort normal and breath sounds normal. No accessory muscle usage. No tachypnea. No respiratory distress. She has no decreased breath sounds. She has no wheezes. She has no rhonchi. She has no rales. She  exhibits no tenderness.  Abdominal: Soft. She exhibits no distension. There is no tenderness.  Musculoskeletal: She exhibits no edema.  Neurological: She is alert and oriented to person, place, and time.  Nursing note and vitals reviewed.  Dg Chest 2 View  Result Date: 03/18/2016 CLINICAL DATA:  Mid chest pain, productive cough for 1 month EXAM: CHEST  2 VIEW COMPARISON:  10/27/2014 FINDINGS: Heart and  mediastinal contours are within normal limits. No focal opacities or effusions. No acute bony abnormality. IMPRESSION: No active cardiopulmonary disease. Electronically Signed   By: Rolm Baptise M.D.   On: 03/18/2016 12:27       Assessment:     Cough: Sub acute less than 8 weeks cough with occasional sputum production     Plan:    Hemodynamically stable with O2 Sat on RA of 97% ?? Bronchitis vs seasonal allergy. Chest xray ordered to R/O PNA Tessalon pearl prescribed prn cough. I will consider A/B treatment depending on xray report. F/U with PCP soon if no improvement. Plan to treat for allergy if no improvement at next visit. Patient verbalized understanding and agreed with treatment plan. I will call her with xray report.  Addendum: Xray clear. Result discussed with patient. Advised to start Zyrtec OTC in addition to tessalon. Albuterol prn wheezing. Consider referral for PFT vs allergist at next visit. She verbalized understanding.

## 2016-03-18 NOTE — Addendum Note (Signed)
Addended by: Andrena Mews T on: 03/18/2016 01:48 PM   Modules accepted: Orders

## 2016-03-30 ENCOUNTER — Other Ambulatory Visit: Payer: Self-pay | Admitting: Family Medicine

## 2016-03-31 ENCOUNTER — Other Ambulatory Visit (HOSPITAL_COMMUNITY)
Admission: RE | Admit: 2016-03-31 | Discharge: 2016-03-31 | Disposition: A | Discharge status: Home / Self Care | Payer: Self-pay | Source: Ambulatory Visit | Attending: Family Medicine | Admitting: Family Medicine

## 2016-03-31 ENCOUNTER — Encounter: Payer: Self-pay | Admitting: Family Medicine

## 2016-03-31 ENCOUNTER — Ambulatory Visit (INDEPENDENT_AMBULATORY_CARE_PROVIDER_SITE_OTHER): Payer: Self-pay | Admitting: Family Medicine

## 2016-03-31 VITALS — BP 124/72 | HR 66 | Temp 97.9°F | Ht 65.0 in | Wt 225.2 lb

## 2016-03-31 DIAGNOSIS — G5603 Carpal tunnel syndrome, bilateral upper limbs: Secondary | ICD-10-CM

## 2016-03-31 DIAGNOSIS — R300 Dysuria: Secondary | ICD-10-CM

## 2016-03-31 DIAGNOSIS — Z113 Encounter for screening for infections with a predominantly sexual mode of transmission: Secondary | ICD-10-CM | POA: Insufficient documentation

## 2016-03-31 DIAGNOSIS — M549 Dorsalgia, unspecified: Secondary | ICD-10-CM

## 2016-03-31 DIAGNOSIS — Z01419 Encounter for gynecological examination (general) (routine) without abnormal findings: Secondary | ICD-10-CM

## 2016-03-31 DIAGNOSIS — G8929 Other chronic pain: Secondary | ICD-10-CM

## 2016-03-31 DIAGNOSIS — Z1151 Encounter for screening for human papillomavirus (HPV): Secondary | ICD-10-CM | POA: Insufficient documentation

## 2016-03-31 DIAGNOSIS — Z124 Encounter for screening for malignant neoplasm of cervix: Secondary | ICD-10-CM

## 2016-03-31 LAB — POCT URINALYSIS DIPSTICK
Bilirubin, UA: NEGATIVE
Blood, UA: NEGATIVE
GLUCOSE UA: NEGATIVE
KETONES UA: NEGATIVE
Leukocytes, UA: NEGATIVE
Nitrite, UA: NEGATIVE
PROTEIN UA: NEGATIVE
SPEC GRAV UA: 1.02
Urobilinogen, UA: 0.2
pH, UA: 6

## 2016-03-31 MED ORDER — CYCLOBENZAPRINE HCL 10 MG PO TABS: 5.0000 mg | ORAL_TABLET | Freq: Three times a day (TID) | ORAL | 0 refills | Status: DC | PRN

## 2016-03-31 MED ORDER — BENZONATATE 100 MG PO CAPS: 100.0000 mg | ORAL_CAPSULE | Freq: Two times a day (BID) | ORAL | 0 refills | Status: DC | PRN

## 2016-03-31 NOTE — Progress Notes (Signed)
[name] is a 51 y.o. female presents to office today for annual physical exam examination.  Concerns today include:  Low back pain Patient reports that she will have back spasm up to twice monthly.  She reports that this has been ongoing for years.  She notes that pain is relieved by Flexeril and Mobic.  She has not done PT for this.  Denies falls, weakness.  Occ numbness with lying down for long periods of time in the feet.    Hand numbness She reports numbness in the hands often.  She reports difficulty with turning knobs sometimes.  Otherwise no weakness.  She reports improvement in symptoms with mobic.  She has not braced wrists.  She has massaged area.  Dysuria Patient reports intermittent episodes of dysuria over the last month.  She denies fevers, chills, nausea, vomiting.  Endorses occ suprapubic pain.  No post coital bleeding, no hematuria, no new back pain, no vaginal discharge or bleeding.  Last colonoscopy: needed Last mammogram: 01/2013, [facility] 1 Last pap smear: 07/2012, normal Immunizations needed: Flu Vaccine: yes   Past Medical History:  Diagnosis Date  . Cough due to angiotensin-converting enzyme inhibitor 05/27/2010  . Diastolic heart failure 0/3212   NEW ON ECHO 02/2010  . Hemoptysis 02/2010   DUE TO ALV HGE.Marland KitchenMarland KitchenANA NEGATIVE...DEEMED DUE TO DIASTOLIC CHF  . Hypertension    DX SEVERAL DECADES YRS AGO- UNTRREATED UNTIL ADMISSION 02/2010  . Hyperthyroidism   . PPD positive    NEGATIVE DURING CHILDHOOD IN [facility]..1ST + TEST 1993 DURING IMMIGRATION GRREN CARD CLEARENCE.Marland KitchenCXR NORMAL 1993 PER HX..s/p INR Rx AT [facility] [id number].Marland KitchenREPEAT + 02/2010   Social History   Social History  . Marital status: Married    Spouse name: N/A  . Number of children: N/A  . Years of education: N/A   Occupational History  . Not on file.   Social History Main Topics  . Smoking status: Never Smoker  . Smokeless tobacco: Never Used  . Alcohol use No  . Drug use: No  .  Sexual activity: Yes    Birth control/ protection: Surgical, Post-menopausal     Comment: BTL 51 years old   Other Topics Concern  . Not on file   Social History Narrative  . No narrative on file   Past Surgical History:  Procedure Laterality Date  . CESAREAN SECTION    . TUBAL LIGATION     Family History  Problem Relation Age of Onset  . Hypertension Father   . Sudden death Father   . Hyperlipidemia Neg Hx   . Heart attack Neg Hx   . Diabetes Neg Hx     ROS: Review of Systems Constitutional: negative Eyes: positive for contacts/glasses Ears, nose, mouth, throat, and face: positive for allergies Respiratory: positive for cough Cardiovascular: negative Gastrointestinal: positive for abdominal pain and intermittent, see above Genitourinary:negative Integument/breast: negative Hematologic/lymphatic: negative Musculoskeletal:positive for back pain and stiff joints Neurological: negative Behavioral/Psych: negative Endocrine: negative Allergic/Immunologic: positive for seasonal allergies    Physical exam BP 124/72   Pulse 66   Temp 97.9 F (36.6 C) (Oral)   Ht 5\' 5"  (1.651 m)   Wt 225 lb 3.2 oz (102.2 kg)   LMP  (LMP Unknown) Comment: pt states 1.5 years ago   SpO2 99%   BMI 37.48 kg/m  General appearance: alert, cooperative, appears stated age and no distress Head: Normocephalic, without obvious abnormality, atraumatic Eyes: negative findings: lids and lashes normal, conjunctivae and sclerae normal, corneas  clear and pupils equal, round, reactive to light and accomodation Ears: normal TM's and external ear canals both ears Nose: Nares normal. Septum midline. Mucosa normal. No drainage or sinus tenderness. Throat: lips, mucosa, and tongue normal; teeth and gums normal and mild o/p erythema Neck: no adenopathy, supple, symmetrical, trachea midline and thyroid not enlarged, symmetric, no tenderness/mass/nodules Back: symmetric, no curvature. ROM normal. No CVA  tenderness., mild paraspinal TTP in the lumbosacral area Lungs: clear to auscultation bilaterally Heart: regular rate and rhythm, S1, S2 normal, no murmur, click, rub or gallop Abdomen: soft, non-tender; bowel sounds normal; no masses,  no organomegaly Pelvic: cervix normal in appearance, external genitalia normal, no adnexal masses or tenderness, no cervical motion tenderness, rectovaginal septum normal, uterus normal size, shape, and consistency and vagina normal without discharge Extremities: extremities normal, atraumatic, no cyanosis or edema Pulses: 2+ and symmetric Skin: Skin color, texture, turgor normal. No rashes or lesions Lymph nodes: Cervical, supraclavicular, and axillary nodes normal. Neurologic: Grossly normal    Assessment/ Plan: [name] here for annual physical exam.   1. Well woman exam with routine gynecological exam - histories updated - Cytology - PAP [place] w/ HPV/ GC/CT testing - Will call with results  2. Dysuria, UA with no evidence of infection - Reviewed results with patient - Urinalysis Dipstick  3. Bilateral carpal tunnel syndrome - Recommended night bracing of wrists - Continue Mobic - Ice - If persistent recommend that she seek appt w/ [facility] for possible ultrasound and injection  4. Chronic bilateral back pain, unspecified back location - Flexeril rx'd for prn use - Mobic - Return precautions reviewed  Follow up prn    Henley Boettner M. [name], DO PGY-3, [facility]

## 2016-03-31 NOTE — Patient Instructions (Addendum)
I recommend that you use a wrist splint at nighttime.  Do this for the next 3-4 weeks.  If you are having persistent symptoms, I want you to call the Sports medicine clinic for an appointment.  Sometimes they can do wrist injections to help with your symptoms.   Carpal Tunnel Syndrome Carpal tunnel syndrome is a condition that causes pain in your hand and arm. The carpal tunnel is a narrow area that is on the palm side of your wrist. Repeated wrist motion or certain diseases may cause swelling in the tunnel. This swelling can pinch the main nerve in the wrist (median nerve). Follow these instructions at home: If you have a splint:   Wear it as told by your doctor. Remove it only as told by your doctor.  Loosen the splint if your fingers:  Become numb and tingle.  Turn blue and cold.  Keep the splint clean and dry. General instructions   Take over-the-counter and prescription medicines only as told by your doctor.  Rest your wrist from any activity that may be causing your pain. If needed, talk to your employer about changes that can be made in your work, such as getting a wrist pad to use while typing.  If directed, apply ice to the painful area:  Put ice in a plastic bag.  Place a towel between your skin and the bag.  Leave the ice on for 20 minutes, 2-3 times per day.  Keep all follow-up visits as told by your doctor. This is important.  Do any exercises as told by your doctor, physical therapist, or occupational therapist. Contact a doctor if:  You have new symptoms.  Medicine does not help your pain.  Your symptoms get worse. This information is not intended to replace advice given to you by your health care provider. Make sure you discuss any questions you have with your health care provider. Document Released: 12/26/2010 Document Revised: 06/14/2015 Document Reviewed: 05/24/2014 Elsevier Interactive Patient Education  2017 [employer].

## 2016-04-02 LAB — CERVICOVAGINAL ANCILLARY ONLY
CHLAMYDIA, DNA PROBE: NEGATIVE
NEISSERIA GONORRHEA: NEGATIVE

## 2016-04-02 LAB — CYTOLOGY - PAP
Adequacy: ABSENT
Diagnosis: NEGATIVE
HPV: NOT DETECTED

## 2016-04-03 ENCOUNTER — Encounter: Payer: Self-pay | Admitting: Family Medicine

## 2016-07-03 ENCOUNTER — Other Ambulatory Visit: Payer: Self-pay | Admitting: Family Medicine

## 2016-07-03 DIAGNOSIS — I10 Essential (primary) hypertension: Secondary | ICD-10-CM

## 2016-07-03 DIAGNOSIS — Z862 Personal history of diseases of the blood and blood-forming organs and certain disorders involving the immune mechanism: Secondary | ICD-10-CM

## 2016-07-14 ENCOUNTER — Telehealth: Payer: Self-pay | Admitting: Student

## 2016-07-14 NOTE — Telephone Encounter (Signed)
Pt wants to know why she needs an appt before her furseomide and ferrous suflate can be refilled.  Pt is requesting to be called.

## 2016-07-14 NOTE — Telephone Encounter (Signed)
I don't know. I am her new PCP starting 07/20/2017. May be her former PCP advised her to schedule which I think is a good idea.  We need to monitor her electrolytes while she in on Furosamide.

## 2016-07-16 ENCOUNTER — Ambulatory Visit: Payer: Self-pay | Admitting: Student in an Organized Health Care Education/Training Program

## 2016-07-16 NOTE — Telephone Encounter (Signed)
Contacted pt and informed her of below and let her know that she would have a new PCP and that he would like her to come in for some labs to be sure everything looks ok before refilling these particular medicines.  Pt stated that she would call next week to schedule and appointment. Routing to PCP as an Pharmacist, hospital. Katharina Caper, Hillard Goodwine D, [place]

## 2016-09-03 ENCOUNTER — Other Ambulatory Visit: Payer: Self-pay | Admitting: *Deleted

## 2016-09-03 DIAGNOSIS — E559 Vitamin D deficiency, unspecified: Secondary | ICD-10-CM

## 2016-09-24 ENCOUNTER — Encounter: Payer: Self-pay | Admitting: Student

## 2016-09-24 ENCOUNTER — Encounter: Payer: Self-pay | Admitting: Gastroenterology

## 2016-09-24 ENCOUNTER — Ambulatory Visit (INDEPENDENT_AMBULATORY_CARE_PROVIDER_SITE_OTHER): Payer: Medicaid Other | Admitting: Student

## 2016-09-24 ENCOUNTER — Other Ambulatory Visit: Payer: Self-pay | Admitting: Student

## 2016-09-24 VITALS — BP 125/75 | HR 60 | Temp 98.0°F | Ht 65.0 in | Wt 234.4 lb

## 2016-09-24 DIAGNOSIS — Z862 Personal history of diseases of the blood and blood-forming organs and certain disorders involving the immune mechanism: Secondary | ICD-10-CM

## 2016-09-24 DIAGNOSIS — Z5181 Encounter for therapeutic drug level monitoring: Secondary | ICD-10-CM

## 2016-09-24 DIAGNOSIS — E0590 Thyrotoxicosis, unspecified without thyrotoxic crisis or storm: Secondary | ICD-10-CM | POA: Diagnosis not present

## 2016-09-24 DIAGNOSIS — Z8679 Personal history of other diseases of the circulatory system: Secondary | ICD-10-CM

## 2016-09-24 DIAGNOSIS — E785 Hyperlipidemia, unspecified: Secondary | ICD-10-CM | POA: Insufficient documentation

## 2016-09-24 DIAGNOSIS — I5030 Unspecified diastolic (congestive) heart failure: Secondary | ICD-10-CM | POA: Diagnosis not present

## 2016-09-24 DIAGNOSIS — E7800 Pure hypercholesterolemia, unspecified: Secondary | ICD-10-CM

## 2016-09-24 DIAGNOSIS — I10 Essential (primary) hypertension: Secondary | ICD-10-CM | POA: Diagnosis not present

## 2016-09-24 DIAGNOSIS — E559 Vitamin D deficiency, unspecified: Secondary | ICD-10-CM

## 2016-09-24 DIAGNOSIS — Z1231 Encounter for screening mammogram for malignant neoplasm of breast: Secondary | ICD-10-CM

## 2016-09-24 HISTORY — DX: Vitamin D deficiency, unspecified: E55.9

## 2016-09-24 NOTE — Progress Notes (Signed)
Subjective:    [name] is a 51 y.o. old female here to have her thyroid levels  HPI Thyroid issue: she reports fatigue, insomnia, sweating more, heat intolerance. She has history of thyroid irradiation done 2013. Denies voice change, trouble swallowing. Denies skin dryness or losing hair. Currently not on any medication for thyroid issue. Not sure if the metoprolol is for this or hypertension. Was on PTU in the past. She was followed by Dr. Jeanann Lewandowsky at that time.   Hypertension: checks her blood pressure at home. SBP ranges from 120-132mmHg. DBP 75-13mmHg. On metoprolol 50 mg twice a day, amlodipine 5 mg  and furosemide 20 mg daily. Doesn't exercise regularly.   PMH/Problem List: has Essential hypertension; HEMOPTYSIS UNSPECIFIED; Diastolic heart failure ([place]); Hyperthyroidism; History of anemia; Joint ache; DUB (dysfunctional uterine bleeding); Right knee pain; Back pain; GERD (gastroesophageal reflux disease); Hyperlipidemia; and Vitamin D deficiency on her problem list.   has a past medical history of Cough due to angiotensin-converting enzyme inhibitor (05/27/2010); Diastolic heart failure (01/6107); Hemoptysis (02/2010); Hypertension; Hyperthyroidism; and PPD positive.  FH:  Family History  Problem Relation Age of Onset  . Hypertension Father   . Sudden death Father   . Hyperlipidemia Neg Hx   . Heart attack Neg Hx   . Diabetes Neg Hx     SH Social History  Substance Use Topics  . Smoking status: Never Smoker  . Smokeless tobacco: Never Used  . Alcohol use No    Review of Systems Review of systems negative except for pertinent positives and negatives in history of present illness above.     Objective:     Vitals:   09/24/16 0917 09/24/16 0949  BP: (!) 160/82 125/75  Pulse: 60   Temp: 98 F (36.7 C)   TempSrc: Oral   SpO2: 97%   Weight: 234 lb 6.4 oz (106.3 kg)   Height: 5\' 5"  (1.651 m)    Body mass index is 39.01 kg/m.  Physical Exam GEN: appears well, no  apparent distress. Head: normocephalic and atraumatic  HEM: negative for cervical or periauricular lymphadenopathies ENDO: negative thyromegaly or nodule CVS: RRR, nl S1&S2, no murmurs, no edema RESP: no IWOB, good air movement bilaterally, CTAB GI: BS present & normal, soft, NTND MSK: no focal tenderness or notable swelling SKIN: no apparent skin lesion NEURO: alert and oiented appropriately, no gross deficits  PSYCH: euthymic mood with congruent affect    Assessment and Plan:  1. Hyperthyroidism: not on any medication at present.  -Check TSH and Free T4 today.   2. History of anemia: no clear cause. Appears anemia of chronic disease based on her CBC from one year ago.  - CBC with Differential/Platelet - Gave number for colonoscopy  3. Essential hypertension: repeat BP within normal range. Recommended keeping BP log at home and bring it to her next visit.  -Continue current medications for now. -May need to review her medications going forward, particularly her metoprolol.  - Basic metabolic panel  4. Pure hypercholesterolemia: not taking her Lipitor. ASCVD risk score 3.4% based on repeat BP and last lipid panel.  - Lipid panel  5. History of heart failure: she denies this. No Echo in her chart. She is on Lasix 40 mg daily. No cardiopulmonary symptoms except for occ edema. She has no edema today. - ECHOCARDIOGRAM COMPLETE; Future  6. Encounter for therapeutic drug monitoring - Vitamin B12  7. Vitamin D deficiency - VITAMIN D 25 Hydroxy (Vit-D Deficiency, Fractures)  Return in about  3 months (around 12/24/2016) for HTN.  Mercy Riding, MD 09/24/16 Pager: [phone]

## 2016-09-24 NOTE — Assessment & Plan Note (Signed)
Recheck TSH and Free T4 today. ?

## 2016-09-24 NOTE — Addendum Note (Signed)
Addended by: [place] Pink on: 09/24/2016 03:26 PM   Modules accepted: Orders

## 2016-09-24 NOTE — Assessment & Plan Note (Signed)
Check vit D level today   

## 2016-09-24 NOTE — Assessment & Plan Note (Signed)
Repeat BP within normal range. Recommended keeping BP log at home and bring it to her next visit.  -Continue current medications for now. -May need to review her medications going forward, particularly her metoprolol.  - Basic metabolic panel

## 2016-09-24 NOTE — Patient Instructions (Addendum)
It was great seeing you today! We have addressed the following issues today 1. Blood pressure: blood pressure is 125/75 today. Your goal blood pressure is less than 130/80. Please check your blood pressure at home and keep the blood pressure log as we discussed. Meanwhile, I strongly recommend lifestyle change including diet and exercise as below. 2.   Thyroid issue: we are checking a thyroid level today. 3.   History of heart failure: I don';t have an echocardiogram (an ultrasound of the heart) in the chart. So we are getting an echocardiogram to rule out this. 4.  Cholesterol: please continue taking the cholesterol medication. We are checking a cholesterol level as well. 5.  Colonoscopy and mammograms: please call the numbers we gave you to schedule for colonoscopy and mammogram.  If we did any lab work today, and the results require attention, either me or my nurse will get in touch with you. If everything is normal, you will get a letter in mail and a message via . If you don't hear from [place] in two weeks, please give [place] a call. Otherwise, we look forward to seeing you again at your next visit. If you have any questions or concerns before then, please call the clinic at [phone].  Please bring all your medications to every doctors visit  Sign up for My Chart to have easy access to your labs results, and communication with your Primary care physician.    Please check-out at the front desk before leaving the clinic.    Take Care,   Dr. Cyndia Skeeters              Colon Cancer  People with early colon cancer usually have no warning signs or symptoms.  If found early, most patients can be cured, but if found when it has already spread, the chance of survival is not as good.  Colon cancer is the second most common cause of concern is in the [place] with over 56,000 deaths from colon cancer in 2005  Colon cancer is a common, treatable disease. Screening tests can find a cancer  early, before you  have symptoms, and make it more likely that you will survive the disease.  Who needs to be tested? If you are age 15-75 yrs, you should be tested for colon cancer.  Ways to be tested:  A colonoscopy the best test to detect colon cancer. It requires you to drink a bowel preparation to clean out your colon before the test. During this test, a tube with a camera inserted into your rectum and examines your entire colon. You can be given medicine to make you sleepy during the exam. Therefore, you will not be able to drive immediately after the test. There is a small risk of bowel injury during the test.   Stool cards that you can take home and take a sample of your stool is another option. The cards are not as good as colonoscopy at detecting cancer, but the tests are easier and cheaper.   To schedule the colonoscopy, you can call one of the 3 options below:  [facility]. Phone number: [phone]  [facility]. Phone number: [phone]  [facility]: Phone number [phone]

## 2016-09-24 NOTE — Assessment & Plan Note (Signed)
She denies this. No Echo in her chart. She is on Lasix 40 mg daily. No cardiopulmonary symptoms except for occ edema. She has no edema today. - ECHOCARDIOGRAM COMPLETE; Future

## 2016-09-24 NOTE — Assessment & Plan Note (Signed)
Appears anemia of chronic disease based on her CBC from one year ago.  - CBC with Differential/Platelet - Gave number for colonoscopy

## 2016-09-25 ENCOUNTER — Encounter: Payer: Self-pay | Admitting: Student

## 2016-09-25 ENCOUNTER — Other Ambulatory Visit: Payer: Self-pay | Admitting: Student

## 2016-09-25 DIAGNOSIS — E538 Deficiency of other specified B group vitamins: Secondary | ICD-10-CM | POA: Insufficient documentation

## 2016-09-25 DIAGNOSIS — E559 Vitamin D deficiency, unspecified: Secondary | ICD-10-CM

## 2016-09-25 HISTORY — DX: Deficiency of other specified B group vitamins: E53.8

## 2016-09-25 LAB — LIPID PANEL
CHOLESTEROL TOTAL: 197 mg/dL (ref 100–199)
Chol/HDL Ratio: 3.7 ratio (ref 0.0–4.4)
HDL: 53 mg/dL (ref >39)
LDL CALC: 128 mg/dL — AB (ref 0–99)
TRIGLYCERIDES: 82 mg/dL (ref 0–149)
VLDL Cholesterol Cal: 16 mg/dL (ref 5–40)

## 2016-09-25 LAB — CBC WITH DIFFERENTIAL/PLATELET
BASOS: 0 %
Basophils Absolute: 0 10*3/uL (ref 0.0–0.2)
EOS (ABSOLUTE): 0.2 10*3/uL (ref 0.0–0.4)
EOS: 4 %
HEMATOCRIT: 37.6 % (ref 34.0–46.6)
HEMOGLOBIN: 11.6 g/dL (ref 11.1–15.9)
IMMATURE GRANULOCYTES: 0 %
Immature Grans (Abs): 0 10*3/uL (ref 0.0–0.1)
LYMPHS ABS: 1.8 10*3/uL (ref 0.7–3.1)
Lymphs: 39 %
MCH: 26.1 pg — ABNORMAL LOW (ref 26.6–33.0)
MCHC: 30.9 g/dL — ABNORMAL LOW (ref 31.5–35.7)
MCV: 85 fL (ref 79–97)
MONOCYTES: 7 %
MONOS ABS: 0.3 10*3/uL (ref 0.1–0.9)
NEUTROS PCT: 50 %
Neutrophils Absolute: 2.3 10*3/uL (ref 1.4–7.0)
Platelets: 267 10*3/uL (ref 150–379)
RBC: 4.45 x10E6/uL (ref 3.77–5.28)
RDW: 14.3 % (ref 12.3–15.4)
WBC: 4.6 10*3/uL (ref 3.4–10.8)

## 2016-09-25 LAB — TSH: TSH: 2.53 u[IU]/mL (ref 0.450–4.500)

## 2016-09-25 LAB — BASIC METABOLIC PANEL
BUN/Creatinine Ratio: 17 (ref 9–23)
BUN: 14 mg/dL (ref 6–24)
CALCIUM: 9.2 mg/dL (ref 8.7–10.2)
CO2: 27 mmol/L (ref 20–29)
CREATININE: 0.84 mg/dL (ref 0.57–1.00)
Chloride: 102 mmol/L (ref 96–106)
GFR calc Af Amer: 92 mL/min/{1.73_m2} (ref >59)
GFR, EST NON AFRICAN AMERICAN: 80 mL/min/{1.73_m2} (ref >59)
GLUCOSE: 101 mg/dL — AB (ref 65–99)
Potassium: 3.8 mmol/L (ref 3.5–5.2)
SODIUM: 142 mmol/L (ref 134–144)

## 2016-09-25 LAB — T4, FREE: Free T4: 1.28 ng/dL (ref 0.82–1.77)

## 2016-09-25 LAB — VITAMIN B12: Vitamin B-12: 189 pg/mL — ABNORMAL LOW (ref 232–1245)

## 2016-09-25 LAB — VITAMIN D 25 HYDROXY (VIT D DEFICIENCY, FRACTURES): Vit D, 25-Hydroxy: 20.3 ng/mL — ABNORMAL LOW (ref 30.0–100.0)

## 2016-09-25 LAB — SPECIMEN STATUS REPORT

## 2016-09-25 MED ORDER — MULTIVITAMIN WOMEN 50+ PO TABS: 1.0000 | ORAL_TABLET | Freq: Every day | ORAL | 3 refills | Status: DC

## 2016-09-29 ENCOUNTER — Other Ambulatory Visit: Payer: Self-pay

## 2016-09-29 ENCOUNTER — Ambulatory Visit
Admission: RE | Admit: 2016-09-29 | Discharge: 2016-09-29 | Disposition: A | Discharge status: Home / Self Care | Payer: Medicaid Other | Source: Ambulatory Visit | Attending: Family Medicine | Admitting: Family Medicine

## 2016-09-29 ENCOUNTER — Ambulatory Visit (HOSPITAL_COMMUNITY): Payer: Medicaid Other | Attending: Cardiovascular Disease

## 2016-09-29 DIAGNOSIS — Z1231 Encounter for screening mammogram for malignant neoplasm of breast: Secondary | ICD-10-CM

## 2016-09-29 DIAGNOSIS — Z8679 Personal history of other diseases of the circulatory system: Secondary | ICD-10-CM | POA: Diagnosis not present

## 2016-09-29 DIAGNOSIS — I110 Hypertensive heart disease with heart failure: Secondary | ICD-10-CM | POA: Diagnosis not present

## 2016-09-29 DIAGNOSIS — E0590 Thyrotoxicosis, unspecified without thyrotoxic crisis or storm: Secondary | ICD-10-CM | POA: Insufficient documentation

## 2016-09-29 DIAGNOSIS — I509 Heart failure, unspecified: Secondary | ICD-10-CM | POA: Insufficient documentation

## 2016-09-29 DIAGNOSIS — E785 Hyperlipidemia, unspecified: Secondary | ICD-10-CM | POA: Diagnosis not present

## 2016-09-30 ENCOUNTER — Encounter: Payer: Self-pay | Admitting: Student

## 2016-11-11 ENCOUNTER — Ambulatory Visit (AMBULATORY_SURGERY_CENTER): Payer: Self-pay

## 2016-11-11 VITALS — Ht 65.0 in | Wt 232.8 lb

## 2016-11-11 DIAGNOSIS — Z1211 Encounter for screening for malignant neoplasm of colon: Secondary | ICD-10-CM

## 2016-11-11 MED ORDER — SUPREP BOWEL PREP KIT 17.5-3.13-1.6 GM/177ML PO SOLN: 1.0000 | Freq: Once | ORAL | 0 refills | Status: AC

## 2016-11-11 NOTE — Progress Notes (Signed)
No allergies to eggs or soy No diet meds No home oxygen No past problems with anesthesia EXCEPT SHE BELIEVES HAD ITCHING WITH SPINAL WITH NARCOTICS  Registered emmi

## 2016-11-24 ENCOUNTER — Ambulatory Visit (AMBULATORY_SURGERY_CENTER): Payer: Medicaid Other | Admitting: Gastroenterology

## 2016-11-24 ENCOUNTER — Encounter: Payer: Self-pay | Admitting: Gastroenterology

## 2016-11-24 VITALS — BP 133/72 | HR 68 | Temp 97.5°F | Resp 13 | Ht 65.0 in | Wt 232.0 lb

## 2016-11-24 DIAGNOSIS — D122 Benign neoplasm of ascending colon: Secondary | ICD-10-CM

## 2016-11-24 DIAGNOSIS — D124 Benign neoplasm of descending colon: Secondary | ICD-10-CM | POA: Diagnosis not present

## 2016-11-24 DIAGNOSIS — Z1211 Encounter for screening for malignant neoplasm of colon: Secondary | ICD-10-CM | POA: Diagnosis not present

## 2016-11-24 DIAGNOSIS — Z1212 Encounter for screening for malignant neoplasm of rectum: Secondary | ICD-10-CM

## 2016-11-24 MED ORDER — SODIUM CHLORIDE 0.9 % IV SOLN: 500.0000 mL | INTRAVENOUS | Status: DC

## 2016-11-24 NOTE — Patient Instructions (Signed)
YOU HAD AN ENDOSCOPIC PROCEDURE TODAY AT THE  ENDOSCOPY CENTER:   Refer to the procedure report that was given to you for any specific questions about what was found during the examination.  If the procedure report does not answer your questions, please call your gastroenterologist to clarify.  If you requested that your care partner not be given the details of your procedure findings, then the procedure report has been included in a sealed envelope for you to review at your convenience later.  YOU SHOULD EXPECT: Some feelings of bloating in the abdomen. Passage of more gas than usual.  Walking can help get rid of the air that was put into your GI tract during the procedure and reduce the bloating. If you had a lower endoscopy (such as a colonoscopy or flexible sigmoidoscopy) you may notice spotting of blood in your stool or on the toilet paper. If you underwent a bowel prep for your procedure, you may not have a normal bowel movement for a few days.  Please Note:  You might notice some irritation and congestion in your nose or some drainage.  This is from the oxygen used during your procedure.  There is no need for concern and it should clear up in a day or so.  SYMPTOMS TO REPORT IMMEDIATELY:   Following lower endoscopy (colonoscopy or flexible sigmoidoscopy):  Excessive amounts of blood in the stool  Significant tenderness or worsening of abdominal pains  Swelling of the abdomen that is new, acute  Fever of 100F or higher  For urgent or emergent issues, a gastroenterologist can be reached at any hour by calling [phone].   DIET:  We do recommend a small meal at first, but then you may proceed to your regular diet.  Drink plenty of fluids but you should avoid alcoholic beverages for 24 hours.  ACTIVITY:  You should plan to take it easy for the rest of today and you should NOT DRIVE or use heavy machinery until tomorrow (because of the sedation medicines used during the test).     FOLLOW UP: Our staff will call the number listed on your records the next business day following your procedure to check on you and address any questions or concerns that you may have regarding the information given to you following your procedure. If we do not reach you, we will leave a message.  However, if you are feeling well and you are not experiencing any problems, there is no need to return our call.  We will assume that you have returned to your regular daily activities without incident.  If any biopsies were taken you will be contacted by phone or by letter within the next 1-3 weeks.  Please call us at [phone] if you have not heard about the biopsies in 3 weeks.   Await for biopsy results to determine next repeat Colonoscopy screening Polyps (handout given)  SIGNATURES/CONFIDENTIALITY: You and/or your care partner have signed paperwork which will be entered into your electronic medical record.  These signatures attest to the fact that that the information above on your After Visit Summary has been reviewed and is understood.  Full responsibility of the confidentiality of this discharge information lies with you and/or your care-partner. 

## 2016-11-24 NOTE — Progress Notes (Signed)
Report to PACU, RN, vss, BBS= Clear.  

## 2016-11-24 NOTE — Progress Notes (Signed)
Called to room to assist during endoscopic procedure.  Patient ID and intended procedure confirmed with present staff. Received instructions for my participation in the procedure from the performing physician.  

## 2016-11-24 NOTE — Op Note (Signed)
Echo Patient Name: [name] Procedure Date: 11/24/2016 11:15 AM MRN: [id number] Endoscopist: Milus Banister , MD Age: 51 Referring MD:  Date of Birth: 16-Jun-1965 Gender: Female Account #: [id number] Procedure:                Colonoscopy Indications:              Screening for colorectal malignant neoplasm Medicines:                Monitored Anesthesia Care Procedure:                Pre-Anesthesia Assessment:                           - Prior to the procedure, a History and Physical                            was performed, and patient medications and                            allergies were reviewed. The patient's tolerance of                            previous anesthesia was also reviewed. The risks                            and benefits of the procedure and the sedation                            options and risks were discussed with the patient.                            All questions were answered, and informed consent                            was obtained. Prior Anticoagulants: The patient has                            taken no previous anticoagulant or antiplatelet                            agents. ASA Grade Assessment: II - A patient with                            mild systemic disease. After reviewing the risks                            and benefits, the patient was deemed in                            satisfactory condition to undergo the procedure.                           After obtaining informed consent, the colonoscope  was passed under direct vision. Throughout the                            procedure, the patient's blood pressure, pulse, and                            oxygen saturations were monitored continuously. The                            Colonoscope was introduced through the anus and                            advanced to the the cecum, identified by                            appendiceal orifice and  ileocecal valve. The                            colonoscopy was performed without difficulty. The                            patient tolerated the procedure well. The quality                            of the bowel preparation was good. The ileocecal                            valve, appendiceal orifice, and rectum were                            photographed. Scope In: 11:20:52 AM Scope Out: 11:36:00 AM Scope Withdrawal Time: 0 hours 11 minutes 24 seconds  Total Procedure Duration: 0 hours 15 minutes 8 seconds  Findings:                 Four sessile polyps were found in the descending                            colon and ascending colon. The polyps were 2 to 3                            mm in size. These polyps were removed with a cold                            snare. Resection and retrieval were complete.                           Mild melanosis coli.                           The exam was otherwise without abnormality on                            direct and retroflexion views. Complications:  No immediate complications. Estimated blood loss:                            None. Estimated Blood Loss:     Estimated blood loss: none. Impression:               - Four 2 to 3 mm polyps in the descending colon and                            in the ascending colon, removed with a cold snare.                            Resected and retrieved.                           - Mild melanosis changes.                           - The examination was otherwise normal on direct                            and retroflexion views. Recommendation:           - Patient has a contact number available for                            emergencies. The signs and symptoms of potential                            delayed complications were discussed with the                            patient. Return to normal activities tomorrow.                            Written discharge instructions were provided to the                             patient.                           - Resume previous diet.                           - Continue present medications.                           You will receive a letter within 2-3 weeks with the                            pathology results and my final recommendations.                           If the polyp(s) is proven to be 'pre-cancerous' on  pathology, you will need repeat colonoscopy in 3-5                            years. If the polyp(s) is NOT 'precancerous' on                            pathology then you should repeat colon cancer                            screening in 10 years with colonoscopy without need                            for colon cancer screening by any method prior to                            then (including stool testing). Milus Banister, MD 11/24/2016 11:40:21 AM This report has been signed electronically.

## 2016-11-24 NOTE — Progress Notes (Signed)
Pt's states no medical or surgical changes since previsit or office visit. 

## 2016-11-25 ENCOUNTER — Telehealth: Payer: Self-pay | Admitting: *Deleted

## 2016-11-25 NOTE — Telephone Encounter (Signed)
  Follow up Call-  Call back number 11/24/2016  Post procedure Call Back phone  # [phone]  Permission to leave phone message Yes  Some recent data might be hidden     Patient questions:  Do you have a fever, pain , or abdominal swelling? No. Pain Score  0 *  Have you tolerated food without any problems? Yes.    Have you been able to return to your normal activities? Yes.    Do you have any questions about your discharge instructions: Diet   No. Medications  No. Follow up visit  No.  Do you have questions or concerns about your Care? No.  Actions: * If pain score is 4 or above: No action needed, pain <4.

## 2016-11-25 NOTE — Telephone Encounter (Signed)
  Follow up Call-  Call back number 11/24/2016  Post procedure Call Back phone  # [phone]  Permission to leave phone message Yes  Some recent data might be hidden    [facility]

## 2016-11-28 ENCOUNTER — Encounter: Payer: Self-pay | Admitting: Gastroenterology

## 2016-12-08 ENCOUNTER — Other Ambulatory Visit: Payer: Self-pay | Admitting: Family Medicine

## 2016-12-08 DIAGNOSIS — I10 Essential (primary) hypertension: Secondary | ICD-10-CM

## 2016-12-17 ENCOUNTER — Other Ambulatory Visit: Payer: Self-pay | Admitting: Family Medicine

## 2016-12-17 DIAGNOSIS — I10 Essential (primary) hypertension: Secondary | ICD-10-CM

## 2017-01-19 ENCOUNTER — Other Ambulatory Visit: Payer: Self-pay | Admitting: Family Medicine

## 2017-01-23 ENCOUNTER — Other Ambulatory Visit: Payer: Self-pay | Admitting: Student

## 2017-01-23 ENCOUNTER — Other Ambulatory Visit: Payer: Self-pay | Admitting: Family Medicine

## 2017-01-23 DIAGNOSIS — M549 Dorsalgia, unspecified: Secondary | ICD-10-CM

## 2017-01-23 DIAGNOSIS — M25561 Pain in right knee: Principal | ICD-10-CM

## 2017-01-23 DIAGNOSIS — G8929 Other chronic pain: Secondary | ICD-10-CM

## 2017-01-28 ENCOUNTER — Other Ambulatory Visit: Payer: Self-pay | Admitting: Family Medicine

## 2017-02-02 ENCOUNTER — Other Ambulatory Visit: Payer: Self-pay | Admitting: *Deleted

## 2017-02-02 MED ORDER — METOPROLOL TARTRATE 50 MG PO TABS: ORAL_TABLET | ORAL | 3 refills | Status: DC

## 2017-03-07 ENCOUNTER — Other Ambulatory Visit: Payer: Self-pay | Admitting: Family Medicine

## 2017-03-07 DIAGNOSIS — I10 Essential (primary) hypertension: Secondary | ICD-10-CM

## 2017-04-24 ENCOUNTER — Other Ambulatory Visit: Payer: Self-pay | Admitting: Family Medicine

## 2017-04-24 DIAGNOSIS — Z862 Personal history of diseases of the blood and blood-forming organs and certain disorders involving the immune mechanism: Secondary | ICD-10-CM

## 2017-04-24 DIAGNOSIS — I10 Essential (primary) hypertension: Secondary | ICD-10-CM

## 2017-05-05 ENCOUNTER — Other Ambulatory Visit: Payer: Self-pay | Admitting: Family Medicine

## 2017-05-05 DIAGNOSIS — I10 Essential (primary) hypertension: Secondary | ICD-10-CM

## 2017-05-06 ENCOUNTER — Other Ambulatory Visit: Payer: Self-pay | Admitting: Family Medicine

## 2017-05-06 ENCOUNTER — Other Ambulatory Visit: Payer: Self-pay | Admitting: Student

## 2017-05-06 DIAGNOSIS — I10 Essential (primary) hypertension: Secondary | ICD-10-CM

## 2017-05-11 ENCOUNTER — Other Ambulatory Visit: Payer: Self-pay | Admitting: Student

## 2017-05-11 ENCOUNTER — Other Ambulatory Visit: Payer: Self-pay

## 2017-05-11 ENCOUNTER — Ambulatory Visit (INDEPENDENT_AMBULATORY_CARE_PROVIDER_SITE_OTHER): Payer: Medicaid Other | Admitting: Student

## 2017-05-11 ENCOUNTER — Encounter: Payer: Self-pay | Admitting: Student

## 2017-05-11 VITALS — BP 128/78 | HR 55 | Temp 98.3°F | Ht 65.0 in | Wt 238.6 lb

## 2017-05-11 DIAGNOSIS — G8929 Other chronic pain: Secondary | ICD-10-CM | POA: Diagnosis not present

## 2017-05-11 DIAGNOSIS — J309 Allergic rhinitis, unspecified: Secondary | ICD-10-CM

## 2017-05-11 DIAGNOSIS — M549 Dorsalgia, unspecified: Secondary | ICD-10-CM

## 2017-05-11 DIAGNOSIS — I10 Essential (primary) hypertension: Secondary | ICD-10-CM

## 2017-05-11 DIAGNOSIS — M5441 Lumbago with sciatica, right side: Secondary | ICD-10-CM

## 2017-05-11 DIAGNOSIS — M5442 Lumbago with sciatica, left side: Secondary | ICD-10-CM

## 2017-05-11 DIAGNOSIS — M25561 Pain in right knee: Principal | ICD-10-CM

## 2017-05-11 DIAGNOSIS — Z0000 Encounter for general adult medical examination without abnormal findings: Secondary | ICD-10-CM

## 2017-05-11 DIAGNOSIS — Z0001 Encounter for general adult medical examination with abnormal findings: Secondary | ICD-10-CM

## 2017-05-11 HISTORY — DX: Allergic rhinitis, unspecified: J30.9

## 2017-05-11 MED ORDER — FLUTICASONE PROPIONATE 50 MCG/ACT NA SUSP: 2.0000 | Freq: Every day | NASAL | 6 refills | Status: DC

## 2017-05-11 MED ORDER — MELOXICAM 15 MG PO TABS: 15.0000 mg | ORAL_TABLET | Freq: Every day | ORAL | 0 refills | Status: DC

## 2017-05-11 MED ORDER — METOPROLOL TARTRATE 25 MG PO TABS: ORAL_TABLET | ORAL | 0 refills | Status: DC

## 2017-05-11 MED ORDER — AMLODIPINE BESYLATE 5 MG PO TABS: 5.0000 mg | ORAL_TABLET | Freq: Every day | ORAL | 0 refills | Status: DC

## 2017-05-11 NOTE — Progress Notes (Signed)
Subjective:   Chief Complaint  Patient presents with  . Annual Exam   HPI [name] is a 52 y.o. old female here  for annual exam.  Concern today: back pain: for two months. Spontaneous onset. No history of fall or trauma. Pain is sharp, tingling and pinching. Pain radiating down to her toes bilaterally and posteriorly. Meloxicam helps sometimes. No aggravating factor. Pain is when she wakes up and prolonged sitting. She is a hair stylist. Denies history of fever, unintentional weight loss, excessive sweating, history of cancer, urinary retention, bowel or bladder issue or saddle anesthesia   Changes in his/her health in the last 12 months: no Occupation:  Hair stylisit Wears seatbelt: yes.    The patient has regular exercise: yes.  Walks twice a week for about one hour Enough vegetables and fruits: yes.  Smokes cigarette: no Drinks EtOH: no Drug use: no Patient takes vitD & Ca: no. Ever been transfused or tattooed?: no.  The patient is sexually active.  Patient uses birth control: not applicable.  Domestic violence: no.  Advance directive: no. History of depression:no.  Patient dental home: yes.  Immunizations  Needs influenza vaccine: not applicable.  Needs HPV (Women until age 24): not applicable.  Needs Shingrix (all >18yrs of age): declined.  Needs Tdap: no.  Screening Need colon cancer screening: no. Need breast cancer ccreening: no. Need cervical cancer Screening: no. STOP BANG >/=3 for OSA: no Fall in the last 12 months:no  PMH/Problem List: has Essential hypertension; HEMOPTYSIS UNSPECIFIED; Diastolic heart failure ([place]); Hyperthyroidism; History of anemia; Joint ache; DUB (dysfunctional uterine bleeding); Right knee pain; Annual physical exam; Back pain; GERD (gastroesophageal reflux disease); Hyperlipidemia; Vitamin D deficiency; Vitamin B12 deficiency; and Allergic rhinitis on their problem list.   has a past medical history of Cough due to  angiotensin-converting enzyme inhibitor (5/0/9326), Diastolic heart failure (07/1243), Hemoptysis (02/2010), Hypertension, Hyperthyroidism, and PPD positive.  [facility]  Family History  Problem Relation Age of Onset  . Hypertension Father   . Sudden death Father   . Hyperlipidemia Neg Hx   . Heart attack Neg Hx   . Diabetes Neg Hx   . Colon cancer Neg Hx    Family history of heart disease before age of 70 yrs: no. Family history of stroke: no. Family history of cancer: no.  SH Social History   Tobacco Use  . Smoking status: Never Smoker  . Smokeless tobacco: Never Used  Substance Use Topics  . Alcohol use: No  . Drug use: No     Review of Systems  Constitutional: Negative for fatigue and fever.  HENT: Negative for trouble swallowing and voice change.   Eyes: Negative for visual disturbance.  Respiratory: Negative for cough and shortness of breath.   Cardiovascular: Positive for leg swelling. Negative for chest pain and palpitations.  Gastrointestinal: Negative for abdominal pain, blood in stool and nausea.  Genitourinary: Negative for dysuria, hematuria, vaginal bleeding and vaginal discharge.  Musculoskeletal: Positive for arthralgias and back pain. Negative for myalgias.  Skin: Negative for rash.  Neurological: Negative for dizziness, light-headedness and numbness.  Psychiatric/Behavioral: Negative for confusion and dysphoric mood.       Objective:   Physical Exam Vitals:   05/11/17 1030  BP: 128/78  Pulse: (!) 55  Temp: 98.3 F (36.8 C)  TempSrc: Oral  SpO2: 98%  Weight: 238 lb 9.6 oz (108.2 kg)  Height: 5\' 5"  (1.651 m)   Body mass index is 39.71 kg/m.  GEN: appears well &  comfortable. No apparent distress. Head: normocephalic and atraumatic  Eyes: conjunctiva without injection. Sclera anicteric. Ears: external ear, ear canal and TM normal Oropharynx: MMM. No erythema. No exudation or petechiae. HEM: negative for cervical or periauricular  lymphadenopathies CVS: Bradycardic to 54.  RR, nl s1 & s2, no murmurs, no edema,  2+ pulses bilaterally RESP: no IWOB, good air movement bilaterally, CTAB GI: BS present & normal, soft, NTND GU: no suprapubic or CVA tenderness MSK: Back & LE exam  Normal skin, spine with normal alignment and no deformity. LE symmetric appears symmetric  No step offs.  Tenderness to palpation across the lower back, more over paraspinal muscles.  ROM is full at the lumbosacral region  Lying and seated SLR negative  Neuro exam in LE: motor 5/5 in all muscle groups, light sensation intact in L4-S1 dermatomes, patellar and Achilles reflexes 1+ bilaterally  SKIN: no apparent skin lesion ENDO: negative thyromegally NEURO: alert and oiented appropriately, no gross deficits   PSYCH: euthymic mood with congruent affect    Assessment & Plan:  1. Annual physical exam: history, problem list, medication and allergy list updated in her chart.  Physical exam benign except for tenderness to palpation across her lower buttock.  Discussed lifestyle change including diet and exercise to lose weight.  BMI is at 39.51.  This could also help with the chronic back pain.  She is otherwise up-to-date on screening tests and immunizations.  Gave her handout for advance directive.  2. Chronic bilateral low back pain with bilateral sciatica: this is likely due to underlying degenerative disc disease.  Her neuro exam is within normal limits.  She has no red flags for infectious etiology, malignancy, cord compression or fracture.  I recommended taking extra strength Tylenol every 6 hours.  I also recommended back exercises and gave a handout.  Discussed about the importance of lifestyle change to lose some weight.  Gave a handout about diet and exercise.  Also gave her phone number for [facility] on no nutrition clinic if interested. - meloxicam (MOBIC) 15 MG tablet; Take 1 tablet (15 mg total) by mouth daily.  Dispense: 30 tablet; Refill:  0  3. Allergic rhinitis, unspecified seasonality, unspecified trigger - fluticasone (FLONASE) 50 MCG/ACT nasal spray; Place 2 sprays into both nostrils daily.  Dispense: 16 g; Refill: 6  4. Essential hypertension: Well-controlled.  Will cut down on her metoprolol to 25 mg twice a day given bradycardia.  Blood pressure within normal limits.    Follow-up in 6 months or sooner if needed.  Wendee Beavers PGY-3 Pager [phone] 05/11/17  12:40 PM

## 2017-05-11 NOTE — Patient Instructions (Addendum)
It was great seeing you today! 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  I recommend taking extra strength Tylenol 3-4 times a day.  I also recommend daily walking and back exercise as we discussed in the office.  -Blood pressure: Your blood pressure is 128/78 today.  Your goal blood pressure is less than 130/80.  We reduced your metoprolol to 25 mg twice a day.  Recommend follow-up in 6 months.  -Allergy: We send a prescription for Flonase nasal spray to your pharmacy.  Please pick up this prescription and start using as we discussed in the office.   If we did any lab work today, and the results require attention, either me or my nurse will get in touch with you. If everything is normal, you will get a letter in mail and a message via . If you don't hear from [place] in two weeks, please give [place] a call. Otherwise, we look forward to seeing you again at your next visit. If you have any questions or concerns before then, please call the clinic at [phone].  Please bring all your medications to every doctors visit  Sign up for My Chart to have easy access to your labs results, and communication with your Primary care physician.    Please check-out at the front desk before leaving the clinic.    Take Care,   Dr. Cyndia Skeeters  Portion Size   Choose healthier foods such as 100% whole grains, vegetables, fruits, beans, nut seeds, olive oil, most vegetable oils, fat-free dietary, wild game and fish.   Avoid sweet tea, other sweetened beverages, soda, fruit juice, cold cereal and milk and trans fat.   Eat at least 3 meals and 1-2 snacks per day.  Aim for no more than 5 hours between eating.  Eat breakfast within one hour of getting up.    Exercise at least 150 minutes per week, including weight resistance exercises 3 or 4 times per week.   Try to lose at least 7-10% of your current body weight.   Limit your salt (Sodium)  intake to less than 2 gm (2000 mg) a day if you have conditions such as  elevated blood pressure, heart failure...    You may also read about DASH and/or Mediterranean diet at the following web site if you have blood pressure or heart condition. IdentityList.se LACodes.co.za   Back Exercises If you have pain in your back, do these exercises 2-3 times each day or as told by your doctor. When the pain goes away, do the exercises once each day, but repeat the steps more times for each exercise (do more repetitions). If you do not have pain in your back, do these exercises once each day or as told by your doctor. Exercises Single Knee to Chest  Do these steps 3-5 times in a row for each leg: 1. Lie on your back on a firm bed or the floor with your legs stretched out. 2. Bring one knee to your chest. 3. Hold your knee to your chest by grabbing your knee or thigh. 4. Pull on your knee until you feel a gentle stretch in your lower back. 5. Keep doing the stretch for 10-30 seconds. 6. Slowly let go of your leg and straighten it.  Pelvic Tilt  Do these steps 5-10 times in a row: 1. Lie on your back on a firm bed or the floor with your legs stretched  out. 2. Bend your knees so they point up to the ceiling. Your feet should be flat on the floor. 3. Tighten your lower belly (abdomen) muscles to press your lower back against the floor. This will make your tailbone point up to the ceiling instead of pointing down to your feet or the floor. 4. Stay in this position for 5-10 seconds while you gently tighten your muscles and breathe evenly.  Cat-Cow  Do these steps until your lower back bends more easily: 1. Get on your hands and knees on a firm surface. Keep your hands under your shoulders, and keep your knees under your hips. You may put padding under  your knees. 2. Let your head hang down, and make your tailbone point down to the floor so your lower back is round like the back of a cat. 3. Stay in this position for 5 seconds. 4. Slowly lift your head and make your tailbone point up to the ceiling so your back hangs low (sags) like the back of a cow. 5. Stay in this position for 5 seconds.  Press-Ups  Do these steps 5-10 times in a row: 1. Lie on your belly (face-down) on the floor. 2. Place your hands near your head, about shoulder-width apart. 3. While you keep your back relaxed and keep your hips on the floor, slowly straighten your arms to raise the top half of your body and lift your shoulders. Do not use your back muscles. To make yourself more comfortable, you may change where you place your hands. 4. Stay in this position for 5 seconds. 5. Slowly return to lying flat on the floor.  Bridges  Do these steps 10 times in a row: 1. Lie on your back on a firm surface. 2. Bend your knees so they point up to the ceiling. Your feet should be flat on the floor. 3. Tighten your butt muscles and lift your butt off of the floor until your waist is almost as high as your knees. If you do not feel the muscles working in your butt and the back of your thighs, slide your feet 1-2 inches farther away from your butt. 4. Stay in this position for 3-5 seconds. 5. Slowly lower your butt to the floor, and let your butt muscles relax.  If this exercise is too easy, try doing it with your arms crossed over your chest. Belly Crunches  Do these steps 5-10 times in a row: 1. Lie on your back on a firm bed or the floor with your legs stretched out. 2. Bend your knees so they point up to the ceiling. Your feet should be flat on the floor. 3. Cross your arms over your chest. 4. Tip your chin a little bit toward your chest but do not bend your neck. 5. Tighten your belly muscles and slowly raise your chest just enough to lift your shoulder blades a tiny  bit off of the floor. 6. Slowly lower your chest and your head to the floor.  Back Lifts Do these steps 5-10 times in a row: 1. Lie on your belly (face-down) with your arms at your sides, and rest your forehead on the floor. 2. Tighten the muscles in your legs and your butt. 3. Slowly lift your chest off of the floor while you keep your hips on the floor. Keep the back of your head in line with the curve in your back. Look at the floor while you do this. 4. Stay in this position  for 3-5 seconds. 5. Slowly lower your chest and your face to the floor.  Contact a doctor if:  Your back pain gets a lot worse when you do an exercise.  Your back pain does not lessen 2 hours after you exercise. If you have any of these problems, stop doing the exercises. Do not do them again unless your doctor says it is okay. Get help right away if:  You have sudden, very bad back pain. If this happens, stop doing the exercises. Do not do them again unless your doctor says it is okay. This information is not intended to replace advice given to you by your health care provider. Make sure you discuss any questions you have with your health care provider. Document Released: 02/08/2010 Document Revised: 06/14/2015 Document Reviewed: 03/02/2014 Elsevier Interactive Patient Education  [employer].

## 2017-05-14 ENCOUNTER — Other Ambulatory Visit: Payer: Self-pay | Admitting: Family Medicine

## 2017-05-14 DIAGNOSIS — I10 Essential (primary) hypertension: Secondary | ICD-10-CM

## 2017-08-17 ENCOUNTER — Other Ambulatory Visit: Payer: Self-pay | Admitting: *Deleted

## 2017-08-17 DIAGNOSIS — M549 Dorsalgia, unspecified: Secondary | ICD-10-CM

## 2017-08-17 DIAGNOSIS — M25561 Pain in right knee: Principal | ICD-10-CM

## 2017-08-17 DIAGNOSIS — G8929 Other chronic pain: Secondary | ICD-10-CM

## 2017-08-18 MED ORDER — MELOXICAM 15 MG PO TABS: 15.0000 mg | ORAL_TABLET | Freq: Every day | ORAL | 1 refills | Status: DC

## 2017-10-28 ENCOUNTER — Encounter: Payer: Self-pay | Admitting: Family Medicine

## 2017-10-28 ENCOUNTER — Ambulatory Visit: Payer: Medicaid Other | Admitting: Family Medicine

## 2017-10-28 VITALS — BP 110/75 | HR 56 | Temp 98.7°F | Wt 233.6 lb

## 2017-10-28 DIAGNOSIS — E7800 Pure hypercholesterolemia, unspecified: Secondary | ICD-10-CM

## 2017-10-28 DIAGNOSIS — R221 Localized swelling, mass and lump, neck: Secondary | ICD-10-CM

## 2017-10-28 DIAGNOSIS — M5441 Lumbago with sciatica, right side: Secondary | ICD-10-CM | POA: Diagnosis not present

## 2017-10-28 HISTORY — DX: Localized swelling, mass and lump, neck: R22.1

## 2017-10-28 NOTE — Assessment & Plan Note (Signed)
Patient is currently protecting her airway and has not having any difficulty with breathing.  She reports that this is very similar from what she had experienced in 2013 when she was diagnosed with hyperthyroidism and required radioactive ablation.  Patient's review of systems for thyroid is not specific.  Patient has not had to be on thyroid medications as she has been euthyroid.  Likely prior ablation was not entire thyroid and perhaps partial.  Physical exam negative.  Patient denies allergies, is not on ACE or arb, does not snore, denies any recent upper respiratory illness, denies pain.  Thyroid ultrasound for visualization and TSH for function  Iodine uptake with positive results  If thyroid work-up is negative, consider CT neck for structural visualization given location of discomfort  Patient given return precautions

## 2017-10-28 NOTE — Assessment & Plan Note (Signed)
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  She is on her feet when she gets home and makes dinner at night.  She denies any trauma or falls.  She reports that this was not keeping her from functioning throughout the day.  Given patient's age, physical exam, HPI, and history, I do not feel that imaging is appropriate at this time.  Provided stretching exercises for lower back and strengthening muscles  Can consider gabapentin if radiculopathic pain worsens or keeps patient from daily functioning.  Patient aware that she can call for medication or return if this occurs.  Did not prescribe today as patient was not distressed by pain.

## 2017-10-28 NOTE — Assessment & Plan Note (Signed)
Repeat lipid panel, LDL increased in 2018.  Patient was told in previous years that she does not have to be on a statin.  No history of diabetes.  ASCVD risk is currently low at 2.5%.

## 2017-10-28 NOTE — Progress Notes (Signed)
SUBJECTIVE:  PCP: Sherene Sires, DO Patient ID: [id number]  Date of birth: December 02, 1965  HPI [name] is a 52 y.o. female who presents to clinic with chief complaint of neck fullness. Other complaints today include lower back pain.   #Neck fullness Location: Neck. Quality: She feels like her throat is more full when she swallows. Feels like her neck is getting wider. Not painful. Timing: started 2-3 months ago. Setting: First noticed it when waking up in the morning. Modifying Factors: Has not tried heating pad or ice pack as she reports there is no pain.. Associated: None. Pt Concerns: same symptoms as when patient had to get ablation 4 years ago. Patient is not currently on thyroid medication.  Patient has history of iodine ablation for hyperthyroidism in 2013.  She reports cold intolerance and heat intolerance, some headaches. Denies palpitations, increased stool or diarrhea.  #Lower Back Pain with Sciatica Location: Lower back. Quality: dull achy pain in the lower back after sitting for an hour or two. Then, upon standing up, shooting pain down both legs and cannot walk for a minute until pain subsides. Often has to "walk off" the pain. Quantity: 8/10 pain in back and with shooting pain.  Timing: Started a couple of months ago. Shooting pain started in June or July. Setting: When sitting down happens more than once a day. Modifying Factors: Has not tried medication, stretching. Meloxicam sometimes helps with lower back, but does not help with shoot pain in legs. Denies falls or recent trauma.   Review of Symptoms: See HPI  HISTORY Medications & Allergies: Reviewed with patient and updated in EMR as appropriate.   Past Medical & Surgical History:  Patient Active Problem List   Diagnosis Date Noted  . Allergic rhinitis 05/11/2017  . Vitamin B12 deficiency 09/25/2016  . Hyperlipidemia 09/24/2016  . Vitamin D deficiency 09/24/2016  . Back pain 10/09/2014  . GERD (gastroesophageal reflux  disease) 10/09/2014  . Annual physical exam 01/28/2013  . Right knee pain 05/06/2011  . DUB (dysfunctional uterine bleeding) 04/04/2011  . Diastolic heart failure ([place]) 11/29/2010  . Hyperthyroidism 11/29/2010  . History of anemia 11/29/2010  . Joint ache 11/29/2010  . Essential hypertension 03/28/2010  . HEMOPTYSIS UNSPECIFIED 03/28/2010   Past Surgical History:  Procedure Laterality Date  . CESAREAN SECTION    . TUBAL LIGATION      Family History:  family history includes Hypertension in her father; Sudden death in her father. There is no history of Hyperlipidemia, Heart attack, Diabetes, or Colon cancer.  Social History:   reports that she has never smoked. She has never used smokeless tobacco. She reports that she does not drink alcohol or use drugs.  OBJECTIVE:  BP 110/75   Pulse (!) 56   Temp 98.7 F (37.1 C) (Oral)   Wt 233 lb 9.6 oz (106 kg)   LMP  (LMP Unknown) Comment: pt states 1.5 years ago   SpO2 99%   BMI 38.87 kg/m   Physical Exam:  Gen: NAD, alert, non-toxic, well-appearing, sitting comfortably  Skin: Warm and dry. No obvious rashes, lesions, or trauma. HEENT: NCAT No conjunctival pallor or injection. No scleral icterus or injection.  MMM.  Oropharynx is nonerythematous, without tissue swelling, and patient is protecting her airway. Neck: No swelling appreciated on observation.  No nodules or abnormalities on palpation. CV: RRR.  Normal S1-S2.  RP 2+ bilaterally. No BLEE. Resp: CTAB.  No wheezing, rales, abnormal lung sounds.  No increased WOB Abd: NTND on  palpation to all 4 quadrants.  Positive bowel sounds. Psych: Cooperative with exam. Pleasant. Makes eye contact. Speech normal. Extremities: Moves all extremities spontaneously  Neuro: CN II-XII grossly intact. No FNDs.  Back: There is no tenderness to palpation of lumbar or thoracic spinous processes.  Patient has full range of movement.  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  She is on her feet when she gets home and makes dinner at night.  She denies any trauma or falls.  She reports that this was not keeping her from functioning throughout the day.  Given patient's age, physical exam, HPI, and history, I do not feel that imaging is appropriate at this time.  Provided stretching exercises for lower back and strengthening muscles  Can consider gabapentin if radiculopathic pain worsens or keeps patient from daily functioning.  Patient aware that she can call for medication or return if this occurs.  Did not prescribe today as patient was not distressed by pain.  Hyperlipidemia Repeat lipid panel, LDL increased in 2018.  Patient was told in previous years that she does not have to be on a statin.  No history of diabetes.  ASCVD risk is currently low at 2.5%.   Neck fullness Patient is currently protecting her airway and has not having any difficulty with breathing.  She reports that this is very similar from what she had experienced in 2013 when she was diagnosed with hyperthyroidism and required radioactive ablation.  Patient's review of systems for thyroid is not specific.  Patient has not had to be on thyroid medications as she has been euthyroid.  Likely prior ablation was not entire thyroid and perhaps partial.  Physical exam negative.  Patient denies allergies, is not on ACE or arb, does not snore, denies any recent upper respiratory illness, denies pain.  Thyroid ultrasound for visualization and TSH for function  Iodine uptake with positive results  If thyroid work-up is negative, consider CT neck for structural visualization given location of discomfort  Patient given return precautions  Zettie Cooley, M.D. [place]  PGY -1 10/28/2017,  2:47 PM

## 2017-10-28 NOTE — Patient Instructions (Addendum)
Dear [name],   It was nice to see you today! I am glad you came in for your concerns. This document serves as a "wrap-up" to all that we discussed today and is listed as follows:    Neck swelling  Today you will get labs drawn for TSH.  We will also order a thyroid ultrasound.  We will call you with your lab results  Back pain  I provided you with some stretching exercises that you can do daily.  Please take the slowly, as we do not want you to get hurt.  In the meantime, you can take your meloxicam for any lower back pain.  If you start to experience any increased shooting leg pain, please call and we can send you medication that might help with this if it starts to interfere more in your life.   Thank you for choosing [facility] for your primary care needs and stay well!   Best,   Dr. Zettie Cooley Resident Physician, PGY-1 [facility] [phone]    Don't forget to sign up for MyChart for instant access to your health profile, labs, orders, upcoming appointments or to contact your provider with questions. Stop at the front desk on the way out for more information about how to sign up!   Back Pain, Adult Many adults have back pain from time to time. Common causes of back pain include:  A strained muscle or ligament.  Wear and tear (degeneration) of the spinal disks.  Arthritis.  A hit to the back.  Back pain can be short-lived (acute) or last a long time (chronic). A physical exam, lab tests, and imaging studies may be done to find the cause of your pain. Follow these instructions at home: Managing pain and stiffness  Take over-the-counter and prescription medicines only as told by your health care provider.  If directed, apply heat to the affected area as often as told by your health care provider. Use the heat source that your health care provider recommends, such as a moist heat pack or a heating pad. ? Place a towel between your skin  and the heat source. ? Leave the heat on for 20-30 minutes. ? Remove the heat if your skin turns bright red. This is especially important if you are unable to feel pain, heat, or cold. You have a greater risk of getting burned.  If directed, apply ice to the injured area: ? Put ice in a plastic bag. ? Place a towel between your skin and the bag. ? Leave the ice on for 20 minutes, 2-3 times a day for the first 2-3 days. Activity  Do not stay in bed. Resting more than 1-2 days can delay your recovery.  Take short walks on even surfaces as soon as you are able. Try to increase the length of time you walk each day.  Do not sit, drive, or stand in one place for more than 30 minutes at a time. Sitting or standing for long periods of time can put stress on your back.  Use proper lifting techniques. When you bend and lift, use positions that put less stress on your back: ? [place] your knees. ? Keep the load close to your body. ? Avoid twisting.  Exercise regularly as told by your health care provider. Exercising will help your back heal faster. This also helps prevent back injuries by keeping muscles strong and flexible.  Your health care provider may recommend that you see a  physical therapist. This person can help you come up with a safe exercise program. Do any exercises as told by your physical therapist. Lifestyle  Maintain a healthy weight. Extra weight puts stress on your back and makes it difficult to have good posture.  Avoid activities or situations that make you feel anxious or stressed. Learn ways to manage anxiety and stress. One way to manage stress is through exercise. Stress and anxiety increase muscle tension and can make back pain worse. General instructions  Sleep on a firm mattress in a comfortable position. Try lying on your side with your knees slightly bent. If you lie on your back, put a pillow under your knees.  Follow your treatment plan as told by your health care  provider. This may include: ? Cognitive or behavioral therapy. ? Acupuncture or massage therapy. ? Meditation or yoga. Contact a health care provider if:  You have pain that is not relieved with rest or medicine.  You have increasing pain going down into your legs or buttocks.  Your pain does not improve in 2 weeks.  You have pain at night.  You lose weight.  You have a fever or chills. Get help right away if:  You develop new bowel or bladder control problems.  You have unusual weakness or numbness in your arms or legs.  You develop nausea or vomiting.  You develop abdominal pain.  You feel faint. Summary  Many adults have back pain from time to time. A physical exam, lab tests, and imaging studies may be done to find the cause of your pain.  Use proper lifting techniques. When you bend and lift, use positions that put less stress on your back.  Take over-the-counter and prescription medicines and apply heat or ice as directed by your health care provider. This information is not intended to replace advice given to you by your health care provider. Make sure you discuss any questions you have with your health care provider. Document Released: 01/06/2005 Document Revised: 02/11/2016 Document Reviewed: 02/11/2016 Elsevier Interactive Patient Education  2018 [place] Ask your health care provider which exercises are safe for you. Do exercises exactly as told by your health care provider and adjust them as directed. It is normal to feel mild stretching, pulling, tightness, or discomfort as you do these exercises, but you should stop right away if you feel sudden pain or your pain gets worse. Do not begin these exercises until told by your health care provider. Stretching and range of motion exercises These exercises warm up your muscles and joints and improve the movement and flexibility of your hips and your back. These exercises may also help to  relieve pain, numbness, and tingling. Exercise A: Single knee to chest  1. Lie on your back on a firm surface with both legs straight. 2. Bend one of your knees. Use your hands to move your knee up toward your chest until you feel a gentle stretch in your lower back and buttock. ? Hold your leg in this position by holding onto the front of your knee. ? Keep your other leg as straight as possible. 3. Hold for __________ seconds. 4. Slowly return to the starting position. 5. Repeat this exercise with your other leg. Repeat __________ times. Complete this exercise __________ times a day. Exercise B: Hamstring stretch, supine  1. Lie on your back. 2. Hold both ends of a belt or towel as you loop it over the ball of one of  your feet. The ball of your foot is on the walking surface, right under your toes. 3. Straighten your knee and slowly pull on the belt to raise your leg. ? Do not let your knee bend while you do this. ? Keep your other leg flat on the floor. ? Raise the leg until you feel a gentle stretch in the back of your knee or thigh. 4. Hold for __________ seconds. 5. If told by your health care provider, repeat this exercise with your other leg. Repeat __________ times. Complete this exercise __________ times a day. Strengthening exercises These exercises build strength and endurance in your back. Endurance is the ability to use your muscles for a long time, even after they get tired. Exercise C: Pelvic tilt 1. Lie on your back on a firm bed or the floor. Bend your knees and keep your feet flat. 2. Tense your abdominal muscles. Tip your pelvis up toward the ceiling and flatten your lower back into the floor. ? To help with this exercise, you may place a small towel under your lower back and try to push your back into the towel. 3. Hold for __________ seconds. 4. Let your muscles relax completely before you repeat this exercise. Repeat __________ times. Complete this exercise  __________ times a day. Exercise D: Abdominal crunch  1. Lie on your back on a firm surface. Bend your knees and keep your feet flat. Cross your arms over your chest. 2. Tuck your chin down toward your chest, without bending your neck. 3. Use your abdominal muscles to lift your upper body off of the ground, straight up into the air. ? Try to lift yourself until your shoulder blades are off the ground. You may need to work up to this. ? Keep your lower back on the ground while you crunch upward. ? Do not hold your breath. 4. Slowly lower yourself down. Keep your abdominal muscles tense until you are back to the starting position. Repeat __________ times. Complete this exercise __________ times a day. Exercise E: Alternating arm and leg raises  1. Get on your hands and knees on a firm surface. If you are on a hard floor, you may want to use padding to cushion your knees, such as an exercise mat. 2. Line up your arms and legs. Your hands should be below your shoulders, and your knees should be below your hips. 3. Lift your left leg behind you. At the same time, raise your right arm and straighten it in front of you. ? Do not lift your leg higher than your hip. ? Do not lift your arm higher than your shoulder. ? Keep your abdominal and back muscles tight. ? Keep your hips facing the ground. ? Do not arch your back. ? Keep your balance carefully, and do not hold your breath. 4. Hold for __________ seconds. 5. Slowly return to the starting position and repeat with your right leg and your left arm. Repeat __________ times. Complete this exercise __________ times a day. Posture and body mechanics Body mechanics refers to the movements and positions of your body while you do your daily activities. Posture is part of body mechanics. Good posture and healthy body mechanics can help to relieve stress in your body's tissues and joints. Good posture means that your spine is in its natural S-curve  position (your spine is neutral), your shoulders are pulled back slightly, and your head is not tipped forward. The following are general guidelines for applying improved posture  and body mechanics to your everyday activities. Standing   When standing, keep your spine neutral and your feet about hip-width apart. Keep a slight bend in your knees. Your ears, shoulders, and hips should line up with each other.  When you do a task in which you stand in one place for a long time, place one foot up on a stable object that is 2-4 inches (5-10 cm) high, such as a footstool. This helps keep your spine neutral. Sitting   When sitting, keep your spine neutral and keep your feet flat on the floor. Use a footrest, if necessary, and keep your thighs parallel to the floor. Avoid rounding your shoulders, and avoid tilting your head forward.  When working at a desk or a computer, keep your desk at a height where your hands are slightly lower than your elbows. Slide your chair under your desk so you are close enough to maintain good posture.  When working at a computer, place your monitor at a height where you are looking straight ahead and you do not have to tilt your head forward or downward to look at the screen. Resting  When lying down and resting, avoid positions that are most painful for you.  If you have pain with activities such as sitting, bending, stooping, or squatting (flexion-based activities), lie in a position in which your body does not bend very much. For example, avoid curling up on your side with your arms and knees near your chest (fetal position).  If you have pain with activities such as standing for a long time or reaching with your arms (extension-based activities), lie with your spine in a neutral position and bend your knees slightly. Try the following positions: ? Lying on your side with a pillow between your knees. ? Lying on your back with a pillow under your  knees.  Lifting   When lifting objects, keep your feet at least shoulder-width apart and tighten your abdominal muscles.  Bend your knees and hips and keep your spine neutral. It is important to lift using the strength of your legs, not your back. Do not lock your knees straight out.  Always ask for help to lift heavy or awkward objects. This information is not intended to replace advice given to you by your health care provider. Make sure you discuss any questions you have with your health care provider. Document Released: 01/06/2005 Document Revised: 09/13/2015 Document Reviewed: 10/17/2014 Elsevier Interactive Patient Education  [employer].

## 2017-10-29 ENCOUNTER — Encounter: Payer: Self-pay | Admitting: Family Medicine

## 2017-10-29 LAB — LIPID PANEL
CHOL/HDL RATIO: 3.8 ratio (ref 0.0–4.4)
CHOLESTEROL TOTAL: 184 mg/dL (ref 100–199)
HDL: 48 mg/dL (ref >39)
LDL CALC: 116 mg/dL — AB (ref 0–99)
TRIGLYCERIDES: 100 mg/dL (ref 0–149)
VLDL CHOLESTEROL CAL: 20 mg/dL (ref 5–40)

## 2017-10-29 LAB — TSH: TSH: 1.37 u[IU]/mL (ref 0.450–4.500)

## 2017-10-29 NOTE — Progress Notes (Signed)
Repeated lipid panel. LDL is slightly high, but there is nothing to do as ASCVD is ~2.3%. TSH is also normal. Follow up on thyroid ultrasound results. Patient called and informed or normal findings.

## 2017-11-04 ENCOUNTER — Ambulatory Visit
Admission: RE | Admit: 2017-11-04 | Discharge: 2017-11-04 | Disposition: A | Discharge status: Home / Self Care | Payer: Medicaid Other | Source: Ambulatory Visit | Attending: Family Medicine | Admitting: Family Medicine

## 2017-11-04 DIAGNOSIS — E042 Nontoxic multinodular goiter: Secondary | ICD-10-CM | POA: Diagnosis not present

## 2017-11-04 DIAGNOSIS — R221 Localized swelling, mass and lump, neck: Secondary | ICD-10-CM

## 2017-11-11 ENCOUNTER — Other Ambulatory Visit: Payer: Self-pay | Admitting: *Deleted

## 2017-11-11 DIAGNOSIS — I10 Essential (primary) hypertension: Secondary | ICD-10-CM

## 2017-11-12 MED ORDER — AMLODIPINE BESYLATE 5 MG PO TABS: 5.0000 mg | ORAL_TABLET | Freq: Every day | ORAL | 3 refills | Status: DC

## 2018-02-09 ENCOUNTER — Other Ambulatory Visit: Payer: Self-pay

## 2018-02-09 MED ORDER — METOPROLOL TARTRATE 25 MG PO TABS: ORAL_TABLET | ORAL | 0 refills | Status: DC

## 2018-04-27 ENCOUNTER — Ambulatory Visit: Payer: Medicaid Other | Admitting: Nurse Practitioner

## 2018-05-10 ENCOUNTER — Other Ambulatory Visit: Payer: Self-pay | Admitting: Family Medicine

## 2018-05-10 DIAGNOSIS — M25561 Pain in right knee: Principal | ICD-10-CM

## 2018-05-10 DIAGNOSIS — M549 Dorsalgia, unspecified: Secondary | ICD-10-CM

## 2018-05-10 DIAGNOSIS — G8929 Other chronic pain: Secondary | ICD-10-CM

## 2018-06-03 ENCOUNTER — Other Ambulatory Visit: Payer: Self-pay | Admitting: *Deleted

## 2018-06-04 MED ORDER — METOPROLOL TARTRATE 25 MG PO TABS: ORAL_TABLET | ORAL | 0 refills | Status: DC

## 2018-09-15 ENCOUNTER — Encounter: Payer: Self-pay | Admitting: Family Medicine

## 2018-09-15 ENCOUNTER — Other Ambulatory Visit: Payer: Self-pay

## 2018-09-15 ENCOUNTER — Ambulatory Visit (INDEPENDENT_AMBULATORY_CARE_PROVIDER_SITE_OTHER): Payer: Medicaid Other | Admitting: Family Medicine

## 2018-09-15 VITALS — BP 126/78 | HR 60 | Wt 235.4 lb

## 2018-09-15 DIAGNOSIS — M5431 Sciatica, right side: Secondary | ICD-10-CM | POA: Diagnosis not present

## 2018-09-15 DIAGNOSIS — M5432 Sciatica, left side: Secondary | ICD-10-CM | POA: Diagnosis not present

## 2018-09-15 MED ORDER — GABAPENTIN 100 MG PO CAPS: 100.0000 mg | ORAL_CAPSULE | Freq: Three times a day (TID) | ORAL | 3 refills | Status: DC

## 2018-09-15 NOTE — Patient Instructions (Signed)
It was a pleasure to see you today! Thank you for choosing [facility] for your primary care. [name] was seen for sciatic back pain. Come back to the clinic the next time you need something.  Today we talked about your back pain, this does not seem like this is a surgical emergency which is a wonderful thing.  Were going to give you a card to speak to a nutritionist about weight loss, refer you to physical therapy to work on making her back stronger, and offer a low-dose gabapentin to try and help deal with this nerve related pain.  Please start with the gabapentin at night, your first daytime dose should be a day that you are not working so that you can make sure you are not feeling drowsy.  It is very unlikely that you get drowsy with such a low dose but we want to make sure.      Please bring all your medications to every doctors visit   Sign up for My Chart to have easy access to your labs results, and communication with your Primary care physician.     Please check-out at the front desk before leaving the clinic.     Best,  Dr. Sherene Sires FAMILY MEDICINE RESIDENT - PGY3 09/15/2018 10:22 AM

## 2018-09-16 DIAGNOSIS — M5431 Sciatica, right side: Secondary | ICD-10-CM | POA: Insufficient documentation

## 2018-09-16 NOTE — Assessment & Plan Note (Addendum)
Patient comes in complaining of chronic bilateral sciatica.  There is been no acute increase or change in this pain.  It is just been going for long time and is frustrating to her.  She has no concerning paralysis, paresthesia, incontinence.  She denies any drug use or recent fevers.  Discussed the given the chronic nature of this that physical strengthening of her back and posture is probably 1 of the most useful things to her, so patient accepts referral to physical therapy.  We did say that largely to allow her to have pain control enough to complete her physical therapy we can offer a low-dose gabapentin.  We discussed to stop this medicine if she feels any adverse effects or drowsiness.  My physical exam does not require, in patients that she would refuse surgery at this time because she does not think it is bad enough, so we are not going to order imaging.

## 2018-09-16 NOTE — Progress Notes (Signed)
    Subjective:  [name] is a 53 y.o. female who presents to the [facility] today with a chief complaint of chronic bilateral sciatica.   HPI: Bilateral sciatica Patient comes in complaining of chronic bilateral sciatica.  There is been no acute increase or change in this pain.  It is just been going for long time and is frustrating to her.  She has no concerning paralysis, paresthesia, incontinence.  She denies any drug use or recent fevers.  Pain is not midline, but is in the musculature bilaterally immediately around the spine radiating down around the lateral hip to the feet.  There is no pain in her hip herself that she says.  She is adamant that she does not think this needs nor would she want surgery at this time.   Objective:  Physical Exam: BP 126/78   Pulse 60   Wt 235 lb 6.4 oz (106.8 kg)   LMP  (LMP Unknown) Comment: pt states 1.5 years ago   SpO2 98%   BMI 39.17 kg/m   Gen: NAD, resting comfortably CV: RRR with no murmurs appreciated Pulm: NWOB, CTAB with no crackles, wheezes, or rhonchi GI: Normal bowel sounds present. Soft, Nontender, Nondistended. MSK: No pain to midline spinous processes of lumbar spine.  There is some minor increased tonicity to the lumbar musculature although no specific pain to palpation there.  Patient says it hurts there but not just when someone pushes on it that is where the pain goes through.  Internal and external rotation of the hip show no pain and patient is able to bend at the waist and to the side without major inhibition, she does have some pain with extension of the lumbar spine. Skin: warm, dry Neuro: grossly normal, moves all extremities Psych: Normal affect and thought content  No results found for this or any previous visit (from the past 72 hour(s)).   Assessment/Plan:  Bilateral sciatica Patient comes in complaining of chronic bilateral sciatica.  There is been no acute increase or change in this pain.  It is just been going for long  time and is frustrating to her.  She has no concerning paralysis, paresthesia, incontinence.  She denies any drug use or recent fevers.  Discussed the given the chronic nature of this that physical strengthening of her back and posture is probably 1 of the most useful things to her, so patient accepts referral to physical therapy.  We did say that largely to allow her to have pain control enough to complete her physical therapy we can offer a low-dose gabapentin.  We discussed to stop this medicine if she feels any adverse effects or drowsiness.  My physical exam does not require, in patients that she would refuse surgery at this time because she does not think it is bad enough, so we are not going to order imaging.   Sherene Sires, DO FAMILY MEDICINE RESIDENT - PGY3 09/16/2018 7:27 AM

## 2018-10-04 ENCOUNTER — Other Ambulatory Visit: Payer: Self-pay

## 2018-10-04 ENCOUNTER — Ambulatory Visit: Payer: Medicaid Other

## 2018-10-04 ENCOUNTER — Ambulatory Visit: Payer: Medicaid Other | Attending: Family Medicine

## 2018-10-04 DIAGNOSIS — M25652 Stiffness of left hip, not elsewhere classified: Secondary | ICD-10-CM

## 2018-10-04 DIAGNOSIS — R293 Abnormal posture: Secondary | ICD-10-CM | POA: Diagnosis not present

## 2018-10-04 DIAGNOSIS — M25651 Stiffness of right hip, not elsewhere classified: Secondary | ICD-10-CM

## 2018-10-04 DIAGNOSIS — M5441 Lumbago with sciatica, right side: Secondary | ICD-10-CM | POA: Insufficient documentation

## 2018-10-04 DIAGNOSIS — G8929 Other chronic pain: Secondary | ICD-10-CM | POA: Insufficient documentation

## 2018-10-04 DIAGNOSIS — M5442 Lumbago with sciatica, left side: Secondary | ICD-10-CM | POA: Insufficient documentation

## 2018-10-04 NOTE — Therapy (Signed)
[place], [place], [address] Phone: [phone]   Fax:  [phone]  Physical Therapy Treatment  Patient Details  Name: [name] MRN: [id number] Date of Birth: 03-01-1965 Referring Provider (PT): Buddy Duty, DO   Encounter Date: 10/04/2018  PT End of Session - 10/04/18 1544    Visit Number  1    Number of Visits  12    Date for PT Re-Evaluation  11/19/18    Authorization Type  MCD    PT Start Time  0345   pt late   PT Stop Time  0412    PT Time Calculation (min)  27 min    Activity Tolerance  Patient tolerated treatment well;No increased pain    Behavior During Therapy  WFL for tasks assessed/performed       Past Medical History:  Diagnosis Date  . Cough due to angiotensin-converting enzyme inhibitor 05/27/2010  . Diastolic heart failure Q000111Q   NEW ON ECHO 02/2010  . Hemoptysis 02/2010   DUE TO ALV HGE.Marland KitchenMarland KitchenANA NEGATIVE...DEEMED DUE TO DIASTOLIC CHF  . HEMOPTYSIS UNSPECIFIED 03/28/2010   Followed in Pulmonary clinic/ [facility]/ [facility] med regimen --med calendar 10/10/2010  3 episodes all related to hypertension with diastolic chf - last august 2012. CXR clear 11/21/10    . History of anemia 11/29/2010   Last Hb 10.1 on 08/21 baseline around 9.   . Hypertension    DX SEVERAL DECADES YRS AGO- UNTRREATED UNTIL ADMISSION 02/2010  . Hyperthyroidism   . PPD positive    NEGATIVE DURING CHILDHOOD IN [facility]..1ST + TEST 1993 DURING IMMIGRATION GRREN CARD CLEARENCE.Marland KitchenCXR NORMAL 1993 PER HX..s/p INR Rx AT [facility] [id number].Marland KitchenREPEAT + 02/2010    Past Surgical History:  Procedure Laterality Date  . CESAREAN SECTION    . TUBAL LIGATION      There were no vitals filed for this visit.  Subjective Assessment - 10/04/18 1546    Subjective  She reports lower back pain when she is on her feet.    MD  did not say why this was. No testing    Limitations  Standing;Walking    How long  can you stand comfortably?  as soon as she stands    Diagnostic tests  none    Patient Stated Goals  She wants to ease her pain    Currently in Pain?  No/denies   sitting   Pain Score  8     Pain Location  Back    Pain Orientation  Lower;Left;Right    Pain Descriptors / Indicators  Aching    Pain Type  Chronic pain    Pain Radiating Towards  both legs posterior to ankles    Pain Onset  More than a month ago    Pain Frequency  Intermittent    Aggravating Factors   being on feet.    Pain Relieving Factors  sitting and lying         [facility] PT Assessment - 10/04/18 0001      Assessment   Medical Diagnosis  LBP with sciatica    Referring Provider (PT)  Scoot Bland, DO    Onset Date/Surgical Date  --   3 months    Next MD Visit  As needed    Prior Therapy  no       Precautions   Precautions  None      Restrictions   Weight Bearing Restrictions  No  Balance Screen   Has the patient fallen in the past 6 months  No      Prior Function   Level of Independence  Independent      Cognition   Overall Cognitive Status  Within Functional Limits for tasks assessed      Posture/Postural Control   Posture Comments  incr lumbar lordosis      ROM / Strength   AROM / PROM / Strength  AROM;Strength;PROM      AROM   Overall AROM Comments  quadrant testing in back of leg incr in direction of testing    AROM Assessment Site  Lumbar    Lumbar Flexion  70    Lumbar Extension  15    Lumbar - Right Side Bend  10    Lumbar - Left Side Bend  10      Strength   Overall Strength Comments  LE WNL      Flexibility   Soft Tissue Assessment /Muscle Length  yes    Hamstrings  passive 65 degrees with hamstring pain  Mild hip flexor tightness. .     Quadriceps  tightnness bilaterally    ITB  mild tighness                            PT Education - 10/04/18 1549    Education Details  POC    Person(s) Educated  Patient    Methods  Explanation    Comprehension   Verbalized understanding       PT Short Term Goals - 10/04/18 1614      PT SHORT TERM GOAL #1   Title  She will be indpendnet with initial hEP    Baseline  no HEP    Time  2    Period  Weeks    Status  New      PT SHORT TERM GOAL #2   Title  She will report 10% decr pain in legs on stanidng from chair.    Baseline  7-10/10    Time  2    Period  Weeks    Status  New        PT Long Term Goals - 10/04/18 1620      PT LONG TERM GOAL #1   Title  She will be indpendent with all HEP issued    Baseline  indepndent with initial hEP    Time  6    Period  Weeks    Status  New      PT LONG TERM GOAL #2   Title  She will eport pain decreased  50% or more and symptoms in legs don't appear for 5-10 min after standing to wlk.    Baseline  symptoms appear immediately    Time  6    Period  Weeks    Status  New      PT LONG TERM GOAL #3   Title  Max pain will be 5/10 in back and legs    Baseline  pain > 5/10 with standing and wlaking    Time  6    Period  Weeks            Plan - 10/04/18 1615    Clinical Impression Statement  Ms Piechocki appears to have mechaincal LBP with bilateral sciatica as she is pain free in sitting and supine though even with full pressup prone she had no pain .  she has som hip tihgtness bilaterally and  incr lumbar lordosis.   She should improve with skilled PT and consistent HEp    Personal Factors and Comorbidities  Time since onset of injury/illness/exacerbation   posture and  obesity   Examination-Activity Limitations  Carry;Stand;Locomotion Level    Examination-Participation Restrictions  Community Activity   home activity   Stability/Clinical Decision Making  Evolving/Moderate complexity    Clinical Decision Making  Low    Rehab Potential  Good    PT Frequency  --   3 visits   PT Duration  2 weeks   then 2x/week for 4 weeks   PT Treatment/Interventions  Taping;Passive range of motion;Patient/family education;Manual  techniques;Traction;Therapeutic activities;Therapeutic exercise    PT Next Visit Plan  flexion based HEP    Consulted and Agree with Plan of Care  Patient       Patient will benefit from skilled therapeutic intervention in order to improve the following deficits and impairments:  Pain, Obesity, Difficulty walking, Decreased range of motion, Decreased activity tolerance, Postural dysfunction  Visit Diagnosis: Abnormal posture - Plan: PT plan of care cert/re-cert  Chronic bilateral low back pain with bilateral sciatica - Plan: PT plan of care cert/re-cert  Stiffness of right hip, not elsewhere classified - Plan: PT plan of care cert/re-cert  Stiffness of left hip, not elsewhere classified - Plan: PT plan of care cert/re-cert     Problem List Patient Active Problem List   Diagnosis Date Noted  . Bilateral sciatica 09/16/2018  . Neck fullness 10/28/2017  . Allergic rhinitis 05/11/2017  . Vitamin B12 deficiency 09/25/2016  . Hyperlipidemia 09/24/2016  . Vitamin D deficiency 09/24/2016  . Acute bilateral low back pain with right-sided sciatica 10/09/2014  . GERD (gastroesophageal reflux disease) 10/09/2014  . Right knee pain 05/06/2011  . DUB (dysfunctional uterine bleeding) 04/04/2011  . Diastolic heart failure ([place]) 11/29/2010  . Hyperthyroidism 11/29/2010  . Essential hypertension 03/28/2010    Darrel Hoover  PT 10/04/2018, 4:27 PM  [facility] [address] [place], [place], [address] Phone: [phone]   Fax:  [phone]  Name: [name] MRN: [id number] Date of Birth: [birth year]

## 2018-10-05 ENCOUNTER — Other Ambulatory Visit: Payer: Self-pay | Admitting: Family Medicine

## 2018-10-09 ENCOUNTER — Encounter (HOSPITAL_COMMUNITY): Payer: Self-pay

## 2018-10-09 ENCOUNTER — Emergency Department (HOSPITAL_COMMUNITY)
Admission: EM | Admit: 2018-10-09 | Discharge: 2018-10-10 | Disposition: A | Discharge status: Home / Self Care | Payer: Medicaid Other | Attending: Emergency Medicine | Admitting: Emergency Medicine

## 2018-10-09 ENCOUNTER — Other Ambulatory Visit: Payer: Self-pay

## 2018-10-09 DIAGNOSIS — Y939 Activity, unspecified: Secondary | ICD-10-CM | POA: Diagnosis not present

## 2018-10-09 DIAGNOSIS — I5030 Unspecified diastolic (congestive) heart failure: Secondary | ICD-10-CM | POA: Diagnosis not present

## 2018-10-09 DIAGNOSIS — I110 Hypertensive heart disease with heart failure: Secondary | ICD-10-CM | POA: Diagnosis not present

## 2018-10-09 DIAGNOSIS — Y998 Other external cause status: Secondary | ICD-10-CM | POA: Insufficient documentation

## 2018-10-09 DIAGNOSIS — S0036XA Insect bite (nonvenomous) of nose, initial encounter: Secondary | ICD-10-CM | POA: Diagnosis not present

## 2018-10-09 DIAGNOSIS — E0590 Thyrotoxicosis, unspecified without thyrotoxic crisis or storm: Secondary | ICD-10-CM | POA: Insufficient documentation

## 2018-10-09 DIAGNOSIS — Y929 Unspecified place or not applicable: Secondary | ICD-10-CM | POA: Insufficient documentation

## 2018-10-09 DIAGNOSIS — W57XXXA Bitten or stung by nonvenomous insect and other nonvenomous arthropods, initial encounter: Secondary | ICD-10-CM | POA: Diagnosis not present

## 2018-10-09 DIAGNOSIS — Z79899 Other long term (current) drug therapy: Secondary | ICD-10-CM | POA: Insufficient documentation

## 2018-10-09 DIAGNOSIS — S00261A Insect bite (nonvenomous) of right eyelid and periocular area, initial encounter: Secondary | ICD-10-CM | POA: Diagnosis not present

## 2018-10-09 NOTE — ED Triage Notes (Signed)
Pt reports that a fly bit her R eye around 745p. She reports a burning sensation in that eye. Denies vision changes at time of triage. No bleeding noted.

## 2018-10-10 MED ORDER — PREDNISONE 50 MG PO TABS: 50.0000 mg | ORAL_TABLET | Freq: Every day | ORAL | 0 refills | Status: DC

## 2018-10-10 NOTE — ED Notes (Signed)
Discharge instructions reviewed with patient. Medications discussed. No questions or concerns at this time. Patient in NAD and ambulatory at discharge.

## 2018-10-10 NOTE — ED Provider Notes (Signed)
[facility] DEPT Provider Note   CSN: [id number] Arrival date & time: 10/09/18  2221     History   Chief Complaint Chief Complaint  Patient presents with   Insect Bite    HPI [name] is a 53 y.o. female.     HPI Patient presents to the emergency department with a insect bite around the right lateral nose just below the eye area.  Patient states she is unsure what type of insect it was but she states that it got caught between her glasses and that is when she was bitten.  The patient states that she noticed swelling to the area.  The patient states that she did not take any medications prior to arrival for symptoms.  She states it started about 745 directly after the bite.  Patient denies any shortness of breath, fever, nausea, vomiting, blurred vision or syncope. Past Medical History:  Diagnosis Date   Cough due to angiotensin-converting enzyme inhibitor 0000000   Diastolic heart failure Q000111Q   NEW ON ECHO 02/2010   Hemoptysis 02/2010   DUE TO ALV HGE.Marland KitchenMarland KitchenANA NEGATIVE...DEEMED DUE TO DIASTOLIC CHF   HEMOPTYSIS UNSPECIFIED 03/28/2010   Followed in Pulmonary clinic/ [facility]/ [facility] med regimen --med calendar 10/10/2010  3 episodes all related to hypertension with diastolic chf - last august 2012. CXR clear 11/21/10     History of anemia 11/29/2010   Last Hb 10.1 on 08/21 baseline around 9.    Hypertension    DX SEVERAL DECADES YRS AGO- UNTRREATED UNTIL ADMISSION 02/2010   Hyperthyroidism    PPD positive    NEGATIVE DURING CHILDHOOD IN [facility]..1ST + TEST 1993 DURING IMMIGRATION GRREN CARD CLEARENCE.Marland KitchenCXR NORMAL 1993 PER HX..s/p INR Rx AT [facility] [id number].Marland KitchenREPEAT + 02/2010    Patient Active Problem List   Diagnosis Date Noted   Bilateral sciatica 09/16/2018   Neck fullness 10/28/2017   Allergic rhinitis 05/11/2017   Vitamin B12 deficiency 09/25/2016   Hyperlipidemia 09/24/2016   Vitamin D  deficiency 09/24/2016   Acute bilateral low back pain with right-sided sciatica 10/09/2014   GERD (gastroesophageal reflux disease) 10/09/2014   Right knee pain 05/06/2011   DUB (dysfunctional uterine bleeding) [id number]   Diastolic heart failure ([place]) 11/29/2010   Hyperthyroidism 11/29/2010   Essential hypertension 03/28/2010    Past Surgical History:  Procedure Laterality Date   CESAREAN SECTION     TUBAL LIGATION       OB History    Gravida  7   Para      Term      Preterm      AB  2   Living  5     SAB  1   TAB  1   Ectopic      Multiple      Live Births               Home Medications    Prior to Admission medications   Medication Sig Start Date End Date Taking? Authorizing Provider  amLODipine (NORVASC) 5 MG tablet Take 1 tablet (5 mg total) by mouth daily. 11/12/17   Sherene Sires, DO  ferrous sulfate 325 (65 FE) MG tablet TAKE 1 TABLET(325 MG) BY MOUTH DAILY WITH BREAKFAST Patient not taking: Reported on 11/11/2016 03/18/16   Janora Norlander, DO  fluticasone (FLONASE) 50 MCG/ACT nasal spray Place 2 sprays into both nostrils daily. 05/11/17   Mercy Riding, MD  gabapentin (NEURONTIN) 100 MG capsule Take 1 capsule (  100 mg total) by mouth 3 (three) times daily. 09/15/18   Sherene Sires, DO  meloxicam (MOBIC) 15 MG tablet TAKE 1 TABLET BY MOUTH DAILY 05/10/18   Sherene Sires, DO  metoprolol tartrate (LOPRESSOR) 25 MG tablet TAKE 1 TABLET BY MOUTH TWICE DAILY 10/05/18   Sherene Sires, DO  Multiple Vitamins-Minerals (MULTIVITAMIN WOMEN 50+) TABS Take 1 tablet by mouth daily. 09/25/16   Mercy Riding, MD    Family History Family History  Problem Relation Age of Onset   Hypertension Father    Sudden death Father    Hyperlipidemia Neg Hx    Heart attack Neg Hx    Diabetes Neg Hx    Colon cancer Neg Hx     Social History Social History   Tobacco Use   Smoking status: Never Smoker   Smokeless tobacco: Never Used  Substance Use Topics    Alcohol use: No   Drug use: No     Allergies   Patient has no known allergies.   Review of Systems Review of Systems  All other systems negative except as documented in the HPI. All pertinent positives and negatives as reviewed in the HPI. Physical Exam Updated Vital Signs BP (!) 158/84 (BP Location: Left Arm)    Pulse 75    Temp 98.8 F (37.1 C) (Oral)    Resp 15    Ht 5\' 5"  (1.651 m)    Wt 106.6 kg    LMP  (LMP Unknown) Comment: pt states 1.5 years ago    SpO2 96%    BMI 39.11 kg/m   Physical Exam Vitals signs and nursing note reviewed.  Constitutional:      General: She is not in acute distress.    Appearance: She is well-developed.  HENT:     Head: Normocephalic and atraumatic.     Nose:   Eyes:     Pupils: Pupils are equal, round, and reactive to light.  Pulmonary:     Effort: Pulmonary effort is normal.  Skin:    General: Skin is warm and dry.  Neurological:     Mental Status: She is alert and oriented to person, place, and time.      ED Treatments / Results  Labs (all labs ordered are listed, but only abnormal results are displayed) Labs Reviewed - No data to display  EKG None  Radiology No results found.  Procedures Procedures (including critical care time)  Medications Ordered in ED Medications - No data to display   Initial Impression / Assessment and Plan / ED Course  I have reviewed the triage vital signs and the nursing notes.  Pertinent labs & imaging results that were available during my care of the patient were reviewed by me and considered in my medical decision making (see chart for details).       Patient be treated for insect bite and advised to return here as needed.  Told to use heat and ice over the area.  Final Clinical Impressions(s) / ED Diagnoses   Final diagnoses:  None    ED Discharge Orders    None       Dalia Heading, PA-C 10/10/18 0029    Ward, Delice Bison, DO 10/10/18 [id number]

## 2018-10-10 NOTE — Discharge Instructions (Addendum)
Take Benadryl for itching and the reaction.  Use ice and heat over the area as well.

## 2018-10-11 ENCOUNTER — Other Ambulatory Visit: Payer: Self-pay

## 2018-10-11 ENCOUNTER — Ambulatory Visit: Payer: Medicaid Other | Admitting: Physical Therapy

## 2018-10-11 DIAGNOSIS — G8929 Other chronic pain: Secondary | ICD-10-CM

## 2018-10-11 DIAGNOSIS — M25651 Stiffness of right hip, not elsewhere classified: Secondary | ICD-10-CM

## 2018-10-11 DIAGNOSIS — M25652 Stiffness of left hip, not elsewhere classified: Secondary | ICD-10-CM

## 2018-10-11 DIAGNOSIS — R293 Abnormal posture: Secondary | ICD-10-CM

## 2018-10-11 DIAGNOSIS — M5441 Lumbago with sciatica, right side: Secondary | ICD-10-CM | POA: Diagnosis not present

## 2018-10-11 DIAGNOSIS — M5442 Lumbago with sciatica, left side: Secondary | ICD-10-CM | POA: Diagnosis not present

## 2018-10-11 NOTE — Therapy (Signed)
[place], [place], [address] Phone: [phone]   Fax:  [phone]  Physical Therapy Treatment  Patient Details  Name: [name] MRN: [id number] Date of Birth: 20-Aug-1965 Referring Provider (PT): Buddy Duty, DO   Encounter Date: 10/11/2018  PT End of Session - 10/11/18 0734    Visit Number  2    Number of Visits  12    Date for PT Re-Evaluation  11/19/18    Authorization Type  MCD    Authorization Time Period  10/06/18-10/19/18    Authorization - Visit Number  1    Authorization - Number of Visits  3    PT Start Time  0730   15 minutes late   PT Stop Time  0758    PT Time Calculation (min)  28 min       Past Medical History:  Diagnosis Date  . Cough due to angiotensin-converting enzyme inhibitor 05/27/2010  . Diastolic heart failure Q000111Q   NEW ON ECHO 02/2010  . Hemoptysis 02/2010   DUE TO ALV HGE.Marland KitchenMarland KitchenANA NEGATIVE...DEEMED DUE TO DIASTOLIC CHF  . HEMOPTYSIS UNSPECIFIED 03/28/2010   Followed in Pulmonary clinic/ [facility]/ [facility] med regimen --med calendar 10/10/2010  3 episodes all related to hypertension with diastolic chf - last august 2012. CXR clear 11/21/10    . History of anemia 11/29/2010   Last Hb 10.1 on 08/21 baseline around 9.   . Hypertension    DX SEVERAL DECADES YRS AGO- UNTRREATED UNTIL ADMISSION 02/2010  . Hyperthyroidism   . PPD positive    NEGATIVE DURING CHILDHOOD IN [facility]..1ST + TEST 1993 DURING IMMIGRATION GRREN CARD CLEARENCE.Marland KitchenCXR NORMAL 1993 PER HX..s/p INR Rx AT [facility] [id number].Marland KitchenREPEAT + 02/2010    Past Surgical History:  Procedure Laterality Date  . CESAREAN SECTION    . TUBAL LIGATION      There were no vitals filed for this visit.  Subjective Assessment - 10/11/18 0733    Subjective  No  pain at rest. The pain can get up to a 10 but average is 6/10 with standing.    Currently in Pain?  No/denies                        [facility] Adult PT Treatment/Exercise - 10/11/18 0001      Lumbar Exercises: Stretches   Active Hamstring Stretch  3 reps;30 seconds    Active Hamstring Stretch Limitations  strap    Single Knee to Chest Stretch  3 reps;30 seconds    Single Knee to Chest Stretch Limitations  opp knee extended    Lower Trunk Rotation  3 reps;30 seconds    Pelvic Tilt  10 reps;5 seconds    Piriformis Stretch  3 reps;30 seconds    Piriformis Stretch Limitations  modified    Figure 4 Stretch  3 reps;30 seconds    Figure 4 Stretch Limitations  modified      Verbal cues for technique with stretches. Verbal and demonstration for pelvic tilt required.          PT Short Term Goals - 10/04/18 1614      PT SHORT TERM GOAL #1   Title  She will be indpendnet with initial hEP    Baseline  no HEP    Time  2    Period  Weeks    Status  New      PT SHORT TERM GOAL #2   Title  She will report 10% decr pain in legs on stanidng from chair.    Baseline  7-10/10    Time  2    Period  Weeks    Status  New        PT Long Term Goals - 10/04/18 1620      PT LONG TERM GOAL #1   Title  She will be indpendent with all HEP issued    Baseline  indepndent with initial hEP    Time  6    Period  Weeks    Status  New      PT LONG TERM GOAL #2   Title  She will eport pain decreased  50% or more and symptoms in legs don't appear for 5-10 min after standing to wlk.    Baseline  symptoms appear immediately    Time  6    Period  Weeks    Status  New      PT LONG TERM GOAL #3   Title  Max pain will be 5/10 in back and legs    Baseline  pain > 5/10 with standing and wlaking    Time  6    Period  Weeks            Plan - 10/11/18 [id number]    Clinical Impression Statement  Pt arrived 15 minutes late to PT. No change. This is her first treatment session. Focused session on establishing flexion based HEP. No increased pain during treatment today.    PT Next Visit Plan  review  and progress flexion based HEP    PT Home Exercise Plan  Single knee to chest, pelvic tilt, LTR, figure 4 push and pull, hamstring stretch       Patient will benefit from skilled therapeutic intervention in order to improve the following deficits and impairments:  Pain, Obesity, Difficulty walking, Decreased range of motion, Decreased activity tolerance, Postural dysfunction  Visit Diagnosis: Abnormal posture  Chronic bilateral low back pain with bilateral sciatica  Stiffness of right hip, not elsewhere classified  Stiffness of left hip, not elsewhere classified     Problem List Patient Active Problem List   Diagnosis Date Noted  . Bilateral sciatica 09/16/2018  . Neck fullness 10/28/2017  . Allergic rhinitis 05/11/2017  . Vitamin B12 deficiency 09/25/2016  . Hyperlipidemia 09/24/2016  . Vitamin D deficiency 09/24/2016  . Acute bilateral low back pain with right-sided sciatica 10/09/2014  . GERD (gastroesophageal reflux disease) 10/09/2014  . Right knee pain 05/06/2011  . DUB (dysfunctional uterine bleeding) 04/04/2011  . Diastolic heart failure ([place]) 11/29/2010  . Hyperthyroidism 11/29/2010  . Essential hypertension 03/28/2010    Dorene Ar, PTA 10/11/2018, 8:22 AM  [facility] [address] [place], [place], [address] Phone: [phone]   Fax:  [phone]  Name: [name] MRN: [id number] Date of Birth: Mar 01, 1965

## 2018-10-18 ENCOUNTER — Ambulatory Visit: Payer: Medicaid Other

## 2018-10-18 ENCOUNTER — Other Ambulatory Visit: Payer: Self-pay

## 2018-10-18 DIAGNOSIS — R293 Abnormal posture: Secondary | ICD-10-CM

## 2018-10-18 DIAGNOSIS — M25651 Stiffness of right hip, not elsewhere classified: Secondary | ICD-10-CM | POA: Diagnosis not present

## 2018-10-18 DIAGNOSIS — M5442 Lumbago with sciatica, left side: Secondary | ICD-10-CM | POA: Diagnosis not present

## 2018-10-18 DIAGNOSIS — G8929 Other chronic pain: Secondary | ICD-10-CM | POA: Diagnosis not present

## 2018-10-18 DIAGNOSIS — M25652 Stiffness of left hip, not elsewhere classified: Secondary | ICD-10-CM | POA: Diagnosis not present

## 2018-10-18 DIAGNOSIS — M5441 Lumbago with sciatica, right side: Secondary | ICD-10-CM | POA: Diagnosis not present

## 2018-10-18 NOTE — Therapy (Signed)
[place], [place], [address] Phone: [phone]   Fax:  [phone]  Physical Therapy Treatment  Patient Details  Name: [name] MRN: [id number] Date of Birth: February 20, 1965 Referring Provider (PT): Buddy Duty, DO   Encounter Date: 10/18/2018  PT End of Session - 10/18/18 1708    Visit Number  3    Number of Visits  12    Date for PT Re-Evaluation  11/19/18    Authorization Type  MCD    Authorization Time Period  10/06/18-10/19/18    Authorization - Visit Number  2    Authorization - Number of Visits  3    PT Start Time  7169    PT Stop Time  6789    PT Time Calculation (min)  40 min    Activity Tolerance  Patient tolerated treatment well;No increased pain    Behavior During Therapy  WFL for tasks assessed/performed       Past Medical History:  Diagnosis Date  . Cough due to angiotensin-converting enzyme inhibitor 05/27/2010  . Diastolic heart failure 03/8099   NEW ON ECHO 02/2010  . Hemoptysis 02/2010   DUE TO ALV HGE.Marland KitchenMarland KitchenANA NEGATIVE...DEEMED DUE TO DIASTOLIC CHF  . HEMOPTYSIS UNSPECIFIED 03/28/2010   Followed in Pulmonary clinic/ [facility]/ [facility] med regimen --med calendar 10/10/2010  3 episodes all related to hypertension with diastolic chf - last august 2012. CXR clear 11/21/10    . History of anemia 11/29/2010   Last Hb 10.1 on 08/21 baseline around 9.   . Hypertension    DX SEVERAL DECADES YRS AGO- UNTRREATED UNTIL ADMISSION 02/2010  . Hyperthyroidism   . PPD positive    NEGATIVE DURING CHILDHOOD IN [facility]..1ST + TEST 1993 DURING IMMIGRATION GRREN CARD CLEARENCE.Marland KitchenCXR NORMAL 1993 PER HX..s/p INR Rx AT [id number].Marland KitchenREPEAT + 02/2010    Past Surgical History:  Procedure Laterality Date  . CESAREAN SECTION    . TUBAL LIGATION      There were no vitals filed for this visit.  Subjective Assessment - 10/18/18 1628    Subjective  My back is feeling better.  moderate pain generally when on feet.    Currently in Pain?  No/denies                       [facility] Adult PT Treatment/Exercise - 10/18/18 0001      Exercises   Exercises  Lumbar      Lumbar Exercises: Stretches   Active Hamstring Stretch  30 seconds;2 reps    Active Hamstring Stretch Limitations  strap    Single Knee to Chest Stretch  30 seconds;2 reps    Single Knee to Chest Stretch Limitations  opp knee extended    Lower Trunk Rotation  30 seconds;2 reps    Pelvic Tilt  10 reps;5 seconds    Piriformis Stretch  2 reps;30 seconds    Figure 4 Stretch  2 reps;30 seconds      Lumbar Exercises: Aerobic   Nustep  L4 UE and LE  x 5 min      Lumbar Exercises: Standing   Shoulder Extension  Both;15 reps    Theraband Level (Shoulder Extension)  Level 3 (Green)    Shoulder Extension Limitations  hands facing back and toward body. , green band issued for home      Lumbar Exercises: Supine   Bent Knee Raise  10 reps    Bent Knee Raise Limitations  R?LT  with ab set    Dead Bug Limitations  arms bilateral overhead with abdominal set.     Other Supine Lumbar Exercises  Pilates abdominal prep x 10       Lumbar Exercises: Sidelying   Clam  Right;Left;15 reps             PT Education - 10/18/18 1707    Education Details  HEP    Person(s) Educated  Patient    Methods  Explanation;Demonstration;Tactile cues;Verbal cues;Handout    Comprehension  Verbalized understanding;Returned demonstration       PT Short Term Goals - 10/18/18 1710      PT SHORT TERM GOAL #1   Title  She will be independent with initial hEP    Status  Partially Met      PT SHORT TERM GOAL #2   Title  She will report 10% decr pain in legs on stanidng from chair.    Status  Achieved        PT Long Term Goals - 10/18/18 1710      PT LONG TERM GOAL #1   Title  She will be independent with all HEP issued    Baseline  indepndent with initial hEP first phase    Time  6    Period  Weeks     Status  On-going      PT LONG TERM GOAL #2   Title  She will report pain decreased  50% or more and symptoms in legs don't appear for 5-10 min after standing to wlk.    Baseline  improved but still < 5 min    Status  On-going      PT LONG TERM GOAL #3   Title  Max pain will be 5/10 in back and legs    Baseline  better but still can get as high as 7-8/10    Time  6    Period  Weeks    Status  On-going            Plan - 10/18/18 1708    Clinical Impression Statement  Reported improvement per pt and able to do all exercises today without pain.   Flexion based exercises appears to be appropriate.    PT Treatment/Interventions  Taping;Passive range of motion;Patient/family education;Manual techniques;Traction;Therapeutic activities;Therapeutic exercise    PT Next Visit Plan  review and progress flexion based HEP    PT Home Exercise Plan  Single knee to chest, pelvic tilt, LTR, figure 4 push and pull, hamstring stretch    Consulted and Agree with Plan of Care  Patient       Patient will benefit from skilled therapeutic intervention in order to improve the following deficits and impairments:  Pain, Obesity, Difficulty walking, Decreased range of motion, Decreased activity tolerance, Postural dysfunction  Visit Diagnosis: Chronic bilateral low back pain with bilateral sciatica  Abnormal posture  Stiffness of right hip, not elsewhere classified  Stiffness of left hip, not elsewhere classified     Problem List Patient Active Problem List   Diagnosis Date Noted  . Bilateral sciatica 09/16/2018  . Neck fullness 10/28/2017  . Allergic rhinitis 05/11/2017  . Vitamin B12 deficiency 09/25/2016  . Hyperlipidemia 09/24/2016  . Vitamin D deficiency 09/24/2016  . Acute bilateral low back pain with right-sided sciatica 10/09/2014  . GERD (gastroesophageal reflux disease) 10/09/2014  . Right knee pain 05/06/2011  . DUB (dysfunctional uterine bleeding) 04/04/2011  . Diastolic  heart failure ([place]) 11/29/2010  .  Hyperthyroidism 11/29/2010  . Essential hypertension 03/28/2010    Darrel Hoover  PT 10/18/2018, 5:12 PM  [facility] [address] [place], [place], [address] Phone: [phone]   Fax:  [phone]  Name: Linley Moskal MRN: [id number] Date of Birth: 1965-08-23

## 2018-10-18 NOTE — Patient Instructions (Signed)
PPT with single bent knee raise and bilateral arm raises x10-20 daily , hold 3-5 sec .  Green band shoulder extension daily x 10-20 nreps   Pilates  Abdominal  Prep x 5-10, all 5-7 days per week

## 2018-10-20 ENCOUNTER — Encounter: Payer: Self-pay | Admitting: Physical Therapy

## 2018-10-20 ENCOUNTER — Ambulatory Visit: Payer: Medicaid Other | Admitting: Physical Therapy

## 2018-10-20 ENCOUNTER — Other Ambulatory Visit: Payer: Self-pay

## 2018-10-20 DIAGNOSIS — M25651 Stiffness of right hip, not elsewhere classified: Secondary | ICD-10-CM | POA: Diagnosis not present

## 2018-10-20 DIAGNOSIS — R293 Abnormal posture: Secondary | ICD-10-CM | POA: Diagnosis not present

## 2018-10-20 DIAGNOSIS — G8929 Other chronic pain: Secondary | ICD-10-CM | POA: Diagnosis not present

## 2018-10-20 DIAGNOSIS — M5442 Lumbago with sciatica, left side: Secondary | ICD-10-CM | POA: Diagnosis not present

## 2018-10-20 DIAGNOSIS — M25652 Stiffness of left hip, not elsewhere classified: Secondary | ICD-10-CM | POA: Diagnosis not present

## 2018-10-20 DIAGNOSIS — M5441 Lumbago with sciatica, right side: Secondary | ICD-10-CM | POA: Diagnosis not present

## 2018-10-20 NOTE — Therapy (Signed)
[place], [place], [address] Phone: [phone]   Fax:  [phone]  Physical Therapy Treatment  Patient Details  Name: [name] MRN: [id number] Date of Birth: Oct 15, 1965 Referring Provider (PT): Sherene Sires, DO   Encounter Date: 10/20/2018  PT End of Session - 10/20/18 1103    Visit Number  4    Number of Visits  12    Date for PT Re-Evaluation  11/19/18    Authorization Type  MCD    Authorization Time Period  requesting 8 more visits through end of October    Authorization - Visit Number  3    Authorization - Number of Visits  3    PT Start Time  1103    PT Stop Time  1147    PT Time Calculation (min)  44 min    Activity Tolerance  Patient tolerated treatment well    Behavior During Therapy  [facility] for tasks assessed/performed       Past Medical History:  Diagnosis Date  . Cough due to angiotensin-converting enzyme inhibitor 05/27/2010  . Diastolic heart failure 05/9561   NEW ON ECHO 02/2010  . Hemoptysis 02/2010   DUE TO ALV HGE.Marland KitchenMarland KitchenANA NEGATIVE...DEEMED DUE TO DIASTOLIC CHF  . HEMOPTYSIS UNSPECIFIED 03/28/2010   Followed in Pulmonary clinic/ [facility]/ [facility] med regimen --med calendar 10/10/2010  3 episodes all related to hypertension with diastolic chf - last august 2012. CXR clear 11/21/10    . History of anemia 11/29/2010   Last Hb 10.1 on 08/21 baseline around 9.   . Hypertension    DX SEVERAL DECADES YRS AGO- UNTRREATED UNTIL ADMISSION 02/2010  . Hyperthyroidism   . PPD positive    NEGATIVE DURING CHILDHOOD IN [facility]..1ST + TEST 1993 DURING IMMIGRATION GRREN CARD CLEARENCE.Marland KitchenCXR NORMAL 1993 PER HX..s/p INR Rx AT [facility] [id number].Marland KitchenREPEAT + 02/2010    Past Surgical History:  Procedure Laterality Date  . CESAREAN SECTION    . TUBAL LIGATION      There were no vitals filed for this visit.  Subjective Assessment - 10/20/18 1103    Subjective  pt is reporting  bilat hamstring pain today. Today is hurting worse than they have    Currently in Pain?  Yes    Pain Score  8     Pain Location  --   bilat hamstings   Pain Orientation  Left;Right    Pain Descriptors / Indicators  Tightness;Pressure    Pain Type  Acute pain    Pain Onset  More than a month ago    Pain Frequency  Constant   still every day pain however not as bad   Aggravating Factors   stays the same    Pain Relieving Factors  stretching.         [facility] PT Assessment - 10/20/18 0001      Assessment   Medical Diagnosis  LBP with sciatica    Referring Provider (PT)  Sherene Sires, DO    Next MD Visit  PRN - or after PT      AROM   AROM Assessment Site  Lumbar    Lumbar Flexion  to the floor    Lumbar Extension  22    Lumbar - Right Rotation  WNL slight pulling in back of body    Lumbar - Left Rotation  90% present more pulling       Flexibility   Soft Tissue Assessment /Muscle Length  yes  Hamstrings  supine SLR Rt 76, Lt 78    Quadriceps  prone knee flex Rt 115, Lt 122                   [facility] Adult PT Treatment/Exercise - 10/20/18 0001      Exercises   Exercises  Lumbar      Lumbar Exercises: Stretches   Active Hamstring Stretch  Right;Left;30 seconds   supine with strap   Active Hamstring Stretch Limitations  --   with leg press down on opposite leg.    Quad Stretch  Left;Right;30 seconds   prone with strap   ITB Stretch  Left;Right;30 seconds   cross body with strap in supine     Lumbar Exercises: Aerobic   Nustep  L5x5' LE only      Lumbar Exercises: Standing   Other Standing Lumbar Exercises  3x5 dead lift with 25# KB lifting from 8" step, VC for form, she tends to use knee hyperextnsion  instanding      Lumbar Exercises: Supine   Bridge  10 reps;Compliant   2 sets      Lumbar Exercises: Prone   Other Prone Lumbar Exercises  2x10 HS curls with 8# focus on eccentric control               PT Short Term Goals - 10/20/18 1108       PT SHORT TERM GOAL #1   Title  She will be independent with initial hEP    Status  Achieved      PT SHORT TERM GOAL #2   Title  She will report 10% decr pain in legs on stanidng from chair.    Baseline  50% improvement    Status  Achieved        PT Long Term Goals - 10/20/18 1108      PT LONG TERM GOAL #1   Title  She will be independent with all HEP issued    Baseline  indepndent with initial hEP first phase    Time  4    Period  Weeks    Status  On-going    Target Date  11/17/18      PT LONG TERM GOAL #2   Title  She will report pain decreased  50% or more and symptoms in legs don't appear for 5-10 min after standing to wlk.    Baseline  50% improvement for 10'    Status  Achieved      PT LONG TERM GOAL #3   Title  Max pain will be 5/10 in back and legs    Baseline  better but still can get as high as 7-8/10    Time  4    Period  Weeks    Status  On-going    Target Date  11/17/18      PT LONG TERM GOAL #4   Title  stand as long as needed with minimal to no lower body pain    Baseline  limited to ~ 10' and has pain 7-8/10    Time  4    Period  Weeks    Status  New    Target Date  11/17/18            Plan - 10/20/18 1152    Clinical Impression Statement  Glory is making great progress. She has met all STGs set and progressing in her LTGs.  Overall reports a 505 improvement in her pain.  She  is still limited in functional activities, has some weakness and hip tightness.  Pt is at risk for reinjury if she doesn't continue to improve her strength and learn safe movement patterns.  Her main concern now is bilat hamstring tightness/pain. this responded well to eccentric work.  Request for more visits submitted to MCD    Rehab Potential  Good    PT Frequency  2x / week    PT Duration  4 weeks    PT Treatment/Interventions  Taping;Passive range of motion;Patient/family education;Manual techniques;Traction;Therapeutic activities;Therapeutic exercise    PT Next Visit  Plan  add dead lifts to HEP if tolerated well.    Consulted and Agree with Plan of Care  Patient       Patient will benefit from skilled therapeutic intervention in order to improve the following deficits and impairments:  Pain, Obesity, Difficulty walking, Decreased range of motion, Decreased activity tolerance, Postural dysfunction  Visit Diagnosis: Chronic bilateral low back pain with bilateral sciatica  Abnormal posture  Stiffness of right hip, not elsewhere classified  Stiffness of left hip, not elsewhere classified     Problem List Patient Active Problem List   Diagnosis Date Noted  . Bilateral sciatica 09/16/2018  . Neck fullness 10/28/2017  . Allergic rhinitis 05/11/2017  . Vitamin B12 deficiency 09/25/2016  . Hyperlipidemia 09/24/2016  . Vitamin D deficiency 09/24/2016  . Acute bilateral low back pain with right-sided sciatica 10/09/2014  . GERD (gastroesophageal reflux disease) 10/09/2014  . Right knee pain 05/06/2011  . DUB (dysfunctional uterine bleeding) 04/04/2011  . Diastolic heart failure ([place]) 11/29/2010  . Hyperthyroidism 11/29/2010  . Essential hypertension 03/28/2010    Jeral Pinch PT  10/20/2018, 12:00 PM  [facility] [address] [place], [place], [address] Phone: [phone]   Fax:  [phone]  Name: Tylisha Danis MRN: [id number] Date of Birth: [birth year]

## 2018-10-25 DIAGNOSIS — K0253 Dental caries on pit and fissure surface penetrating into pulp: Secondary | ICD-10-CM | POA: Diagnosis not present

## 2018-10-28 ENCOUNTER — Ambulatory Visit: Payer: Medicaid Other | Attending: Family Medicine | Admitting: Physical Therapy

## 2018-10-28 ENCOUNTER — Other Ambulatory Visit: Payer: Self-pay

## 2018-10-28 ENCOUNTER — Encounter: Payer: Self-pay | Admitting: Physical Therapy

## 2018-10-28 DIAGNOSIS — M5441 Lumbago with sciatica, right side: Secondary | ICD-10-CM | POA: Insufficient documentation

## 2018-10-28 DIAGNOSIS — G8929 Other chronic pain: Secondary | ICD-10-CM | POA: Diagnosis not present

## 2018-10-28 DIAGNOSIS — M5442 Lumbago with sciatica, left side: Secondary | ICD-10-CM | POA: Diagnosis not present

## 2018-10-28 DIAGNOSIS — M25652 Stiffness of left hip, not elsewhere classified: Secondary | ICD-10-CM | POA: Diagnosis not present

## 2018-10-28 DIAGNOSIS — R293 Abnormal posture: Secondary | ICD-10-CM

## 2018-10-28 DIAGNOSIS — M25651 Stiffness of right hip, not elsewhere classified: Secondary | ICD-10-CM | POA: Diagnosis not present

## 2018-10-28 NOTE — Therapy (Signed)
[place], [place], [address] Phone: [phone]   Fax:  [phone]  Physical Therapy Treatment  Patient Details  Name: [name] MRN: [id number] Date of Birth: 10-10-1965 Referring Provider (PT): Sherene Sires, DO   Encounter Date: 10/28/2018  PT End of Session - 10/28/18 1042    Visit Number  5    Number of Visits  12    Date for PT Re-Evaluation  11/19/18    Authorization Type  MCD- 8 more visits    Authorization Time Period  10/22/18-11/18/18    Authorization - Visit Number  1    Authorization - Number of Visits  3    PT Start Time  0935    PT Stop Time  1015    PT Time Calculation (min)  40 min       Past Medical History:  Diagnosis Date  . Cough due to angiotensin-converting enzyme inhibitor 05/27/2010  . Diastolic heart failure [id number]   NEW ON ECHO 02/2010  . Hemoptysis 02/2010   DUE TO ALV HGE.Marland KitchenMarland KitchenANA NEGATIVE...DEEMED DUE TO DIASTOLIC CHF  . HEMOPTYSIS UNSPECIFIED 03/28/2010   Followed in Pulmonary clinic/ [facility]/ [facility] med regimen --med calendar 10/10/2010  3 episodes all related to hypertension with diastolic chf - last august 2012. CXR clear 11/21/10    . History of anemia 11/29/2010   Last Hb 10.1 on 08/21 baseline around 9.   . Hypertension    DX SEVERAL DECADES YRS AGO- UNTRREATED UNTIL ADMISSION 02/2010  . Hyperthyroidism   . PPD positive    NEGATIVE DURING CHILDHOOD IN [facility]..1ST + TEST 1993 DURING IMMIGRATION GRREN CARD CLEARENCE.Marland KitchenCXR NORMAL 1993 PER HX..s/p INR Rx AT [facility] [id number].Marland KitchenREPEAT + 02/2010    Past Surgical History:  Procedure Laterality Date  . CESAREAN SECTION    . TUBAL LIGATION      There were no vitals filed for this visit.  Subjective Assessment - 10/28/18 0940    Subjective  My pain is better than it was.    Currently in Pain?  Yes    Pain Score  7     Pain Location  --   bilat hamstrings   Pain Orientation  Right;Left     Pain Descriptors / Indicators  Tightness    Pain Type  Acute pain    Aggravating Factors   standing, waslking especially    Pain Relieving Factors  laying, sitting                       [facility] Adult PT Treatment/Exercise - 10/28/18 0001      Lumbar Exercises: Stretches   Active Hamstring Stretch  Right;Left;30 seconds   supine with strap   Quad Stretch  Right;Left;2 reps;30 seconds   prone with strap   ITB Stretch  Left;Right;30 seconds   cross body with strap in supine     Lumbar Exercises: Aerobic   Nustep  L5x5' LE only      Lumbar Exercises: Standing   Row  20 reps    Theraband Level (Row)  Level 3 (Green)   given blue for HEP   Shoulder Extension  20 reps    Theraband Level (Shoulder Extension)  Level 3 (Green)   given blue for HEP   Other Standing Lumbar Exercises  10x 2 25# KB at 8 inch step - also shown how to use 1 large hand weight if she finds one  Lumbar Exercises: Supine   Bridge  Compliant;20 reps      Lumbar Exercises: Prone   Other Prone Lumbar Exercises  2x10 HS curls with 8# focus on eccentric control             PT Education - 10/28/18 1041    Education Details  HEP    Person(s) Educated  Patient    Methods  Explanation;Handout    Comprehension  Verbalized understanding       PT Short Term Goals - 10/20/18 1108      PT SHORT TERM GOAL #1   Title  She will be independent with initial hEP    Status  Achieved      PT SHORT TERM GOAL #2   Title  She will report 10% decr pain in legs on stanidng from chair.    Baseline  50% improvement    Status  Achieved        PT Long Term Goals - 10/20/18 1108      PT LONG TERM GOAL #1   Title  She will be independent with all HEP issued    Baseline  indepndent with initial hEP first phase    Time  4    Period  Weeks    Status  On-going    Target Date  11/17/18      PT LONG TERM GOAL #2   Title  She will report pain decreased  50% or more and symptoms in legs don't  appear for 5-10 min after standing to wlk.    Baseline  50% improvement for 10'    Status  Achieved      PT LONG TERM GOAL #3   Title  Max pain will be 5/10 in back and legs    Baseline  better but still can get as high as 7-8/10    Time  4    Period  Weeks    Status  On-going    Target Date  11/17/18      PT LONG TERM GOAL #4   Title  stand as long as needed with minimal to no lower body pain    Baseline  limited to ~ 10' and has pain 7-8/10    Time  4    Period  Weeks    Status  New    Target Date  11/17/18            Plan - 10/28/18 1010    Clinical Impression Statement  Pt reports she was better after last session. We reviewed dead lift and she returms demonstration with good form. Discussed options for weights if she does not find a kettle bell for HEP. Continued with eccentric hamstring strengthening followed by stretching. Updated HEP with dead lift.    PT Next Visit Plan  review dead lifts continue posterior strengthening/stretching    PT Home Exercise Plan  Single knee to chest, pelvic tilt, LTR, figure 4 push and pull, hamstring stretch, dead lift       Patient will benefit from skilled therapeutic intervention in order to improve the following deficits and impairments:  Pain, Obesity, Difficulty walking, Decreased range of motion, Decreased activity tolerance, Postural dysfunction  Visit Diagnosis: Chronic bilateral low back pain with bilateral sciatica  Abnormal posture  Stiffness of right hip, not elsewhere classified  Stiffness of left hip, not elsewhere classified     Problem List Patient Active Problem List   Diagnosis Date Noted  . Bilateral sciatica 09/16/2018  .  Neck fullness 10/28/2017  . Allergic rhinitis 05/11/2017  . Vitamin B12 deficiency 09/25/2016  . Hyperlipidemia 09/24/2016  . Vitamin D deficiency 09/24/2016  . Acute bilateral low back pain with right-sided sciatica 10/09/2014  . GERD (gastroesophageal reflux disease) 10/09/2014   . Right knee pain 05/06/2011  . DUB (dysfunctional uterine bleeding) 04/04/2011  . Diastolic heart failure ([place]) 11/29/2010  . Hyperthyroidism 11/29/2010  . Essential hypertension 03/28/2010    Dorene Ar, PTA 10/28/2018, 10:44 AM  [facility] [address] [place], [place], [address] Phone: [phone]   Fax:  [phone]  Name: [name] MRN: [id number] Date of Birth: 03-28-1965

## 2018-11-01 ENCOUNTER — Other Ambulatory Visit: Payer: Self-pay

## 2018-11-01 ENCOUNTER — Ambulatory Visit: Payer: Medicaid Other | Admitting: Physical Therapy

## 2018-11-01 ENCOUNTER — Encounter: Payer: Self-pay | Admitting: Physical Therapy

## 2018-11-01 DIAGNOSIS — M5442 Lumbago with sciatica, left side: Secondary | ICD-10-CM | POA: Diagnosis not present

## 2018-11-01 DIAGNOSIS — G8929 Other chronic pain: Secondary | ICD-10-CM | POA: Diagnosis not present

## 2018-11-01 DIAGNOSIS — M25652 Stiffness of left hip, not elsewhere classified: Secondary | ICD-10-CM

## 2018-11-01 DIAGNOSIS — M5441 Lumbago with sciatica, right side: Secondary | ICD-10-CM | POA: Diagnosis not present

## 2018-11-01 DIAGNOSIS — M25651 Stiffness of right hip, not elsewhere classified: Secondary | ICD-10-CM | POA: Diagnosis not present

## 2018-11-01 DIAGNOSIS — R293 Abnormal posture: Secondary | ICD-10-CM | POA: Diagnosis not present

## 2018-11-01 NOTE — Patient Instructions (Signed)
  Single arm press with bridges  Ball presses between knees and hands.

## 2018-11-01 NOTE — Therapy (Signed)
[place], [place], [address] Phone: [phone]   Fax:  [phone]  Physical Therapy Treatment  Patient Details  Name: [name] MRN: [id number] Date of Birth: 1965/03/13 Referring Provider (PT): Sherene Sires, DO   Encounter Date: 11/01/2018  PT End of Session - 11/01/18 1132    Visit Number  6    Number of Visits  12    Date for PT Re-Evaluation  11/19/18    Authorization Type  MCD- 8 more visits    Authorization Time Period  10/22/18-11/18/18    Authorization - Visit Number  2    Authorization - Number of Visits  8    PT Start Time  1132    PT Stop Time  1224    PT Time Calculation (min)  52 min    Activity Tolerance  Patient tolerated treatment well       Past Medical History:  Diagnosis Date  . Cough due to angiotensin-converting enzyme inhibitor 05/27/2010  . Diastolic heart failure [id number]   NEW ON ECHO 02/2010  . Hemoptysis 02/2010   DUE TO ALV HGE.Marland KitchenMarland KitchenANA NEGATIVE...DEEMED DUE TO DIASTOLIC CHF  . HEMOPTYSIS UNSPECIFIED 03/28/2010   Followed in Pulmonary clinic/ [facility]/ [facility] med regimen --med calendar 10/10/2010  3 episodes all related to hypertension with diastolic chf - last august 2012. CXR clear 11/21/10    . History of anemia 11/29/2010   Last Hb 10.1 on 08/21 baseline around 9.   . Hypertension    DX SEVERAL DECADES YRS AGO- UNTRREATED UNTIL ADMISSION 02/2010  . Hyperthyroidism   . PPD positive    NEGATIVE DURING CHILDHOOD IN [facility]..1ST + TEST 1993 DURING IMMIGRATION GRREN CARD CLEARENCE.Marland KitchenCXR NORMAL 1993 PER HX..s/p INR Rx AT [facility] [id number].Marland KitchenREPEAT + 02/2010    Past Surgical History:  Procedure Laterality Date  . CESAREAN SECTION    . TUBAL LIGATION      There were no vitals filed for this visit.  Subjective Assessment - 11/01/18 1134    Subjective  Pt reports she is doing pretty good, no pain today.    Patient Stated Goals  She wants to  ease her pain    Currently in Pain?  No/denies                       [facility] Adult PT Treatment/Exercise - 11/01/18 0001      Exercises   Exercises  Lumbar      Lumbar Exercises: Stretches   Active Hamstring Stretch  Left;Right;2 reps;30 seconds   supine with strap   Active Hamstring Stretch Limitations  with leg press on lowered leg    ITB Stretch  Left;Right;2 reps;30 seconds   cross body with strap   Piriformis Stretch  Left;Right;30 seconds;1 rep      Lumbar Exercises: Aerobic   Nustep  L5x5' LE/UE     Lumbar Exercises: Standing   Other Standing Lumbar Exercises  10x 2 25# KB at 4 inch step       Lumbar Exercises: Supine   Clam  10 reps;Other (comment)   each bilat, single, alternating with green band   Clam Limitations  VC to keep pelvis still    Bridge  20 reps   with single side arm press holding 8#    Basic Lumbar Stabilization Limitations  3x6 ball passes btwn hands and knees with PPT , VC for form  PT Education - 11/01/18 1226    Education Details  HEP progression    Person(s) Educated  Patient    Methods  Explanation;Handout    Comprehension  Returned demonstration;Verbalized understanding       PT Short Term Goals - 10/20/18 1108      PT SHORT TERM GOAL #1   Title  She will be independent with initial hEP    Status  Achieved      PT SHORT TERM GOAL #2   Title  She will report 10% decr pain in legs on stanidng from chair.    Baseline  50% improvement    Status  Achieved        PT Long Term Goals - 10/20/18 1108      PT LONG TERM GOAL #1   Title  She will be independent with all HEP issued    Baseline  indepndent with initial hEP first phase    Time  4    Period  Weeks    Status  On-going    Target Date  11/17/18      PT LONG TERM GOAL #2   Title  She will report pain decreased  50% or more and symptoms in legs don't appear for 5-10 min after standing to wlk.    Baseline  50% improvement for 10'    Status   Achieved      PT LONG TERM GOAL #3   Title  Max pain will be 5/10 in back and legs    Baseline  better but still can get as high as 7-8/10    Time  4    Period  Weeks    Status  On-going    Target Date  11/17/18      PT LONG TERM GOAL #4   Title  stand as long as needed with minimal to no lower body pain    Baseline  limited to ~ 10' and has pain 7-8/10    Time  4    Period  Weeks    Status  New    Target Date  11/17/18            Plan - 11/01/18 1229    Clinical Impression Statement  PT continues to do well with her rehab and HEP.  She was able to perform higher level core stability exercise with fatigue and reports of tightness however no pain.  She will benefit from continued strengthening with progression to functional activities.    Rehab Potential  Good    PT Frequency  2x / week    PT Duration  4 weeks    PT Treatment/Interventions  Taping;Passive range of motion;Patient/family education;Manual techniques;Traction;Therapeutic activities;Therapeutic exercise    PT Next Visit Plan  progress to standing functional core strengthening.    Consulted and Agree with Plan of Care  Patient       Patient will benefit from skilled therapeutic intervention in order to improve the following deficits and impairments:  Pain, Obesity, Difficulty walking, Decreased range of motion, Decreased activity tolerance, Postural dysfunction  Visit Diagnosis: Chronic bilateral low back pain with bilateral sciatica  Abnormal posture  Stiffness of right hip, not elsewhere classified  Stiffness of left hip, not elsewhere classified     Problem List Patient Active Problem List   Diagnosis Date Noted  . Bilateral sciatica 09/16/2018  . Neck fullness 10/28/2017  . Allergic rhinitis 05/11/2017  . Vitamin B12 deficiency 09/25/2016  . Hyperlipidemia 09/24/2016  . Vitamin  D deficiency 09/24/2016  . Acute bilateral low back pain with right-sided sciatica 10/09/2014  . GERD  (gastroesophageal reflux disease) 10/09/2014  . Right knee pain 05/06/2011  . DUB (dysfunctional uterine bleeding) 04/04/2011  . Diastolic heart failure ([place]) 11/29/2010  . Hyperthyroidism 11/29/2010  . Essential hypertension 03/28/2010    Jeral Pinch PT  11/01/2018, 12:31 PM  [facility] [address] [place], [place], [address] Phone: [phone]   Fax:  [phone]  Name: Matina Hohnstein MRN: [id number] Date of Birth: 1965-06-15

## 2018-11-02 DIAGNOSIS — K029 Dental caries, unspecified: Secondary | ICD-10-CM | POA: Diagnosis not present

## 2018-11-03 ENCOUNTER — Ambulatory Visit: Payer: Medicaid Other | Admitting: Physical Therapy

## 2018-11-03 ENCOUNTER — Other Ambulatory Visit: Payer: Self-pay

## 2018-11-03 DIAGNOSIS — M25652 Stiffness of left hip, not elsewhere classified: Secondary | ICD-10-CM

## 2018-11-03 DIAGNOSIS — R293 Abnormal posture: Secondary | ICD-10-CM | POA: Diagnosis not present

## 2018-11-03 DIAGNOSIS — G8929 Other chronic pain: Secondary | ICD-10-CM | POA: Diagnosis not present

## 2018-11-03 DIAGNOSIS — M5442 Lumbago with sciatica, left side: Secondary | ICD-10-CM | POA: Diagnosis not present

## 2018-11-03 DIAGNOSIS — M25651 Stiffness of right hip, not elsewhere classified: Secondary | ICD-10-CM | POA: Diagnosis not present

## 2018-11-03 DIAGNOSIS — M5441 Lumbago with sciatica, right side: Secondary | ICD-10-CM | POA: Diagnosis not present

## 2018-11-03 NOTE — Therapy (Signed)
[place], [place], [address] Phone: [phone]   Fax:  [phone]  Physical Therapy Treatment  Patient Details  Name: [name] MRN: [id number] Date of Birth: Oct 08, 1965 Referring Provider (PT): Sherene Sires, DO   Encounter Date: 11/03/2018  PT End of Session - 11/03/18 0911    Visit Number  7    Number of Visits  12    Date for PT Re-Evaluation  11/19/18    Authorization Type  MCD- 8 more visits    Authorization Time Period  10/22/18-11/18/18    Authorization - Visit Number  2    Authorization - Number of Visits  8    PT Start Time  [phone]    PT Stop Time  0911    PT Time Calculation (min)  38 min    Activity Tolerance  Patient tolerated treatment well       Past Medical History:  Diagnosis Date  . Cough due to angiotensin-converting enzyme inhibitor 05/27/2010  . Diastolic heart failure Q000111Q   NEW ON ECHO 02/2010  . Hemoptysis 02/2010   DUE TO ALV HGE.Marland KitchenMarland KitchenANA NEGATIVE...DEEMED DUE TO DIASTOLIC CHF  . HEMOPTYSIS UNSPECIFIED 03/28/2010   Followed in Pulmonary clinic/ [facility]/ [facility] med regimen --med calendar 10/10/2010  3 episodes all related to hypertension with diastolic chf - last august 2012. CXR clear 11/21/10    . History of anemia 11/29/2010   Last Hb 10.1 on 08/21 baseline around 9.   . Hypertension    DX SEVERAL DECADES YRS AGO- UNTRREATED UNTIL ADMISSION 02/2010  . Hyperthyroidism   . PPD positive    NEGATIVE DURING CHILDHOOD IN [facility]..1ST + TEST 1993 DURING IMMIGRATION GRREN CARD CLEARENCE.Marland KitchenCXR NORMAL 1993 PER HX..s/p INR Rx AT [facility] [id number].Marland KitchenREPEAT + 02/2010    Past Surgical History:  Procedure Laterality Date  . CESAREAN SECTION    . TUBAL LIGATION      There were no vitals filed for this visit.  Subjective Assessment - 11/03/18 0851    Subjective  no back pain but having bad leg pain mostly down her Lt leg 7/10    Limitations   Standing;Walking    How long can you stand comfortably?  as soon as she stands    Diagnostic tests  none    Patient Stated Goals  She wants to ease her pain    Pain Onset  More than a month ago                       [facility] Adult PT Treatment/Exercise - 11/03/18 0001      Lumbar Exercises: Stretches   Active Hamstring Stretch  Left;Right;2 reps;30 seconds    Active Hamstring Stretch Limitations  with leg press on lowered leg    Lower Trunk Rotation Limitations  5 sec X 10 reps    ITB Stretch  Left;Right;2 reps;30 seconds    Piriformis Stretch  Left;Right;30 seconds;2 reps      Lumbar Exercises: Aerobic   Nustep  L5 X 6 min LE/UE      Lumbar Exercises: Supine   Clam Limitations  green X 20    Dead Bug  10 reps    Bridge  20 reps      Manual Therapy   Manual therapy comments  long axis distraction, STM with roller stick to posterior and lateral legs more time spent on Lt  PT Short Term Goals - 10/20/18 1108      PT SHORT TERM GOAL #1   Title  She will be independent with initial hEP    Status  Achieved      PT SHORT TERM GOAL #2   Title  She will report 10% decr pain in legs on stanidng from chair.    Baseline  50% improvement    Status  Achieved        PT Long Term Goals - 10/20/18 1108      PT LONG TERM GOAL #1   Title  She will be independent with all HEP issued    Baseline  indepndent with initial hEP first phase    Time  4    Period  Weeks    Status  On-going    Target Date  11/17/18      PT LONG TERM GOAL #2   Title  She will report pain decreased  50% or more and symptoms in legs don't appear for 5-10 min after standing to wlk.    Baseline  50% improvement for 10'    Status  Achieved      PT LONG TERM GOAL #3   Title  Max pain will be 5/10 in back and legs    Baseline  better but still can get as high as 7-8/10    Time  4    Period  Weeks    Status  On-going    Target Date  11/17/18      PT LONG TERM GOAL #4    Title  stand as long as needed with minimal to no lower body pain    Baseline  limited to ~ 10' and has pain 7-8/10    Time  4    Period  Weeks    Status  New    Target Date  11/17/18            Plan - 11/03/18 0911    Clinical Impression Statement  She had more leg pain today so was treated with LAD, passive stretching, and Soft tissue mobilizaiton with roller stick in efforts to decrease pain. This was followed by active stretching and strengthening program with good tolerance. She reported she felt better after sesison. Continue POC    Rehab Potential  Good    PT Frequency  2x / week    PT Duration  4 weeks    PT Treatment/Interventions  Taping;Passive range of motion;Patient/family education;Manual techniques;Traction;Therapeutic activities;Therapeutic exercise    PT Next Visit Plan  progress to standing functional core strengthening.    Consulted and Agree with Plan of Care  Patient       Patient will benefit from skilled therapeutic intervention in order to improve the following deficits and impairments:  Pain, Obesity, Difficulty walking, Decreased range of motion, Decreased activity tolerance, Postural dysfunction  Visit Diagnosis: Chronic bilateral low back pain with bilateral sciatica  Abnormal posture  Stiffness of right hip, not elsewhere classified  Stiffness of left hip, not elsewhere classified     Problem List Patient Active Problem List   Diagnosis Date Noted  . Bilateral sciatica 09/16/2018  . Neck fullness 10/28/2017  . Allergic rhinitis 05/11/2017  . Vitamin B12 deficiency 09/25/2016  . Hyperlipidemia 09/24/2016  . Vitamin D deficiency 09/24/2016  . Acute bilateral low back pain with right-sided sciatica 10/09/2014  . GERD (gastroesophageal reflux disease) 10/09/2014  . Right knee pain 05/06/2011  . DUB (dysfunctional uterine bleeding) 04/04/2011  .  Diastolic heart failure ([place]) 11/29/2010  . Hyperthyroidism 11/29/2010  . Essential  hypertension 03/28/2010    Silvestre Mesi 11/03/2018, 9:13 AM  [facility] [address] [place], [place], [address] Phone: [phone]   Fax:  [phone]  Name: Carmilla Bardy MRN: [id number] Date of Birth: Apr 24, 1965

## 2018-11-08 ENCOUNTER — Encounter: Payer: Self-pay | Admitting: Physical Therapy

## 2018-11-08 ENCOUNTER — Ambulatory Visit: Payer: Medicaid Other | Admitting: Physical Therapy

## 2018-11-08 ENCOUNTER — Other Ambulatory Visit: Payer: Self-pay

## 2018-11-08 DIAGNOSIS — M5442 Lumbago with sciatica, left side: Secondary | ICD-10-CM

## 2018-11-08 DIAGNOSIS — G8929 Other chronic pain: Secondary | ICD-10-CM

## 2018-11-08 DIAGNOSIS — M25651 Stiffness of right hip, not elsewhere classified: Secondary | ICD-10-CM

## 2018-11-08 DIAGNOSIS — R293 Abnormal posture: Secondary | ICD-10-CM

## 2018-11-08 DIAGNOSIS — M25652 Stiffness of left hip, not elsewhere classified: Secondary | ICD-10-CM | POA: Diagnosis not present

## 2018-11-08 DIAGNOSIS — M5441 Lumbago with sciatica, right side: Secondary | ICD-10-CM | POA: Diagnosis not present

## 2018-11-08 NOTE — Therapy (Signed)
[place], [place], [address] Phone: [phone]   Fax:  [phone]  Physical Therapy Treatment  Patient Details  Name: [name] MRN: [id number] Date of Birth: 1965-11-16 Referring Provider (PT): Sherene Sires, DO   Encounter Date: 11/08/2018  PT End of Session - 11/08/18 1208    Visit Number  8    Number of Visits  12    Date for PT Re-Evaluation  11/19/18    Authorization Type  MCD- 8 more visits    Authorization Time Period  10/22/18-11/18/18    Authorization - Visit Number  3    Authorization - Number of Visits  8    PT Start Time  [id number]    PT Stop Time  1146    PT Time Calculation (min)  44 min       Past Medical History:  Diagnosis Date  . Cough due to angiotensin-converting enzyme inhibitor 05/27/2010  . Diastolic heart failure Q000111Q   NEW ON ECHO 02/2010  . Hemoptysis 02/2010   DUE TO ALV HGE.Marland KitchenMarland KitchenANA NEGATIVE...DEEMED DUE TO DIASTOLIC CHF  . HEMOPTYSIS UNSPECIFIED 03/28/2010   Followed in Pulmonary clinic/ [facility]/ [facility] med regimen --med calendar 10/10/2010  3 episodes all related to hypertension with diastolic chf - last august 2012. CXR clear 11/21/10    . History of anemia 11/29/2010   Last Hb 10.1 on 08/21 baseline around 9.   . Hypertension    DX SEVERAL DECADES YRS AGO- UNTRREATED UNTIL ADMISSION 02/2010  . Hyperthyroidism   . PPD positive    NEGATIVE DURING CHILDHOOD IN [facility]..1ST + TEST 1993 DURING IMMIGRATION GRREN CARD CLEARENCE.Marland KitchenCXR NORMAL 1993 PER HX..s/p INR Rx AT [facility] [id number].Marland KitchenREPEAT + 02/2010    Past Surgical History:  Procedure Laterality Date  . CESAREAN SECTION    . TUBAL LIGATION      There were no vitals filed for this visit.  Subjective Assessment - 11/08/18 1105    Subjective  I was in pain in leg yesterday but not today.    Currently in Pain?  No/denies                       [facility] Adult PT  Treatment/Exercise - 11/08/18 0001      Lumbar Exercises: Stretches   Active Hamstring Stretch  Left;Right;2 reps;30 seconds    Lower Trunk Rotation Limitations  5 sec X 10 reps    ITB Stretch  Left;Right;2 reps;30 seconds    Piriformis Stretch  Left;Right;30 seconds;2 reps      Lumbar Exercises: Aerobic   Nustep  L5 X 6 min LE/UE      Lumbar Exercises: Standing   Row  20 reps    Theraband Level (Row)  Level 4 (Blue)    Shoulder Extension  20 reps    Theraband Level (Shoulder Extension)  Level 4 (Blue)    Other Standing Lumbar Exercises  x 10 from step, then x 10 from floor 25# -cues for lat squeeze to avoid rounding upper back     Other Standing Lumbar Exercises  standing palloff press double green band x 10 each way , cues for technique and abdominal draw in       Lumbar Exercises: Supine   Dead Bug  10 reps    Bridge  20 reps      Lumbar Exercises: Sidelying   Clam  Right;Left;15 reps  PT Short Term Goals - 10/20/18 1108      PT SHORT TERM GOAL #1   Title  She will be independent with initial hEP    Status  Achieved      PT SHORT TERM GOAL #2   Title  She will report 10% decr pain in legs on stanidng from chair.    Baseline  50% improvement    Status  Achieved        PT Long Term Goals - 10/20/18 1108      PT LONG TERM GOAL #1   Title  She will be independent with all HEP issued    Baseline  indepndent with initial hEP first phase    Time  4    Period  Weeks    Status  On-going    Target Date  11/17/18      PT LONG TERM GOAL #2   Title  She will report pain decreased  50% or more and symptoms in legs don't appear for 5-10 min after standing to wlk.    Baseline  50% improvement for 10'    Status  Achieved      PT LONG TERM GOAL #3   Title  Max pain will be 5/10 in back and legs    Baseline  better but still can get as high as 7-8/10    Time  4    Period  Weeks    Status  On-going    Target Date  11/17/18      PT LONG TERM GOAL #4    Title  stand as long as needed with minimal to no lower body pain    Baseline  limited to ~ 10' and has pain 7-8/10    Time  4    Period  Weeks    Status  New    Target Date  11/17/18            Plan - 11/08/18 1155    Clinical Impression Statement  [place] reports she is much better since beginning PT. She reports she can stand tolerate at least 30 minutes in standing without need to sit. Progressing to LTG#4   She purchased a 20# Kettle bell and is lifting from the floor at home. Reviewed dead lifts and performed from step and floor. She required cues when lifting from floor to reamain in neutral spine. Began standing core anti rotation bands without increased pan. She requested gray bands for rows. No increased pain with session.    PT Next Visit Plan  progress to standing functional core strengthening.    PT Home Exercise Plan  Single knee to chest, pelvic tilt, LTR, figure 4 push and pull, hamstring stretch, dead lift       Patient will benefit from skilled therapeutic intervention in order to improve the following deficits and impairments:  Pain, Obesity, Difficulty walking, Decreased range of motion, Decreased activity tolerance, Postural dysfunction  Visit Diagnosis: Chronic bilateral low back pain with bilateral sciatica  Abnormal posture  Stiffness of right hip, not elsewhere classified  Stiffness of left hip, not elsewhere classified     Problem List Patient Active Problem List   Diagnosis Date Noted  . Bilateral sciatica 09/16/2018  . Neck fullness 10/28/2017  . Allergic rhinitis 05/11/2017  . Vitamin B12 deficiency 09/25/2016  . Hyperlipidemia 09/24/2016  . Vitamin D deficiency 09/24/2016  . Acute bilateral low back pain with right-sided sciatica 10/09/2014  . GERD (gastroesophageal reflux disease) 10/09/2014  .  Right knee pain 05/06/2011  . DUB (dysfunctional uterine bleeding) 04/04/2011  . Diastolic heart failure ([place]) 11/29/2010  . Hyperthyroidism  11/29/2010  . Essential hypertension 03/28/2010    Dorene Ar, PTA 11/08/2018, 12:13 PM  [facility] [address] [place], [place], [address] Phone: [phone]   Fax:  [phone]  Name: Meline Swider MRN: [id number] Date of Birth: 07/15/1965

## 2018-11-10 ENCOUNTER — Encounter: Payer: Self-pay | Admitting: Physical Therapy

## 2018-11-10 ENCOUNTER — Ambulatory Visit: Payer: Medicaid Other | Admitting: Physical Therapy

## 2018-11-10 ENCOUNTER — Other Ambulatory Visit: Payer: Self-pay

## 2018-11-10 DIAGNOSIS — R293 Abnormal posture: Secondary | ICD-10-CM

## 2018-11-10 DIAGNOSIS — G8929 Other chronic pain: Secondary | ICD-10-CM | POA: Diagnosis not present

## 2018-11-10 DIAGNOSIS — M25651 Stiffness of right hip, not elsewhere classified: Secondary | ICD-10-CM

## 2018-11-10 DIAGNOSIS — M25652 Stiffness of left hip, not elsewhere classified: Secondary | ICD-10-CM | POA: Diagnosis not present

## 2018-11-10 DIAGNOSIS — M5442 Lumbago with sciatica, left side: Secondary | ICD-10-CM | POA: Diagnosis not present

## 2018-11-10 DIAGNOSIS — M5441 Lumbago with sciatica, right side: Secondary | ICD-10-CM

## 2018-11-10 NOTE — Therapy (Signed)
[place], [place], [address] Phone: [phone]   Fax:  [phone]  Physical Therapy Treatment  Patient Details  Name: [name] MRN: [id number] Date of Birth: 08/14/1965 Referring Provider (PT): Sherene Sires, DO   Encounter Date: 11/10/2018  PT End of Session - 11/10/18 0852    Visit Number  9    Number of Visits  12    Date for PT Re-Evaluation  11/19/18    Authorization Time Period  10/22/18-11/18/18    Authorization - Visit Number  4    Authorization - Number of Visits  8    PT Start Time  1245    PT Stop Time  0927    PT Time Calculation (min)  38 min       Past Medical History:  Diagnosis Date  . Cough due to angiotensin-converting enzyme inhibitor 05/27/2010  . Diastolic heart failure 08/996   NEW ON ECHO 02/2010  . Hemoptysis 02/2010   DUE TO ALV HGE.Marland KitchenMarland KitchenANA NEGATIVE...DEEMED DUE TO DIASTOLIC CHF  . HEMOPTYSIS UNSPECIFIED 03/28/2010   Followed in Pulmonary clinic/ [facility]/ [facility] med regimen --med calendar 10/10/2010  3 episodes all related to hypertension with diastolic chf - last august 2012. CXR clear 11/21/10    . History of anemia 11/29/2010   Last Hb 10.1 on 08/21 baseline around 9.   . Hypertension    DX SEVERAL DECADES YRS AGO- UNTRREATED UNTIL ADMISSION 02/2010  . Hyperthyroidism   . PPD positive    NEGATIVE DURING CHILDHOOD IN [facility]..1ST + TEST 1993 DURING IMMIGRATION GRREN CARD CLEARENCE.Marland KitchenCXR NORMAL 1993 PER HX..s/p INR Rx AT [facility] [id number].Marland KitchenREPEAT + 02/2010    Past Surgical History:  Procedure Laterality Date  . CESAREAN SECTION    . TUBAL LIGATION      There were no vitals filed for this visit.  Subjective Assessment - 11/10/18 0851    Subjective  No pain today. A little sore from exercising but okay.    Currently in Pain?  No/denies                       [name] Adult PT Treatment/Exercise - 11/10/18 0001      Lumbar  Exercises: Stretches   Active Hamstring Stretch  Left;Right;2 reps;30 seconds    Lower Trunk Rotation Limitations  5 sec X 10 reps    ITB Stretch  Left;Right;2 reps;30 seconds    Piriformis Stretch  Left;Right;30 seconds;2 reps      Lumbar Exercises: Aerobic   Nustep  L5 X 6 min LE/UE      Lumbar Exercises: Standing   Row  20 reps    Theraband Level (Row)  Other (comment)    Row Limitations  Grey band    Shoulder Extension  20 reps    Theraband Level (Shoulder Extension)  Other (comment)    Shoulder Extension Limitations  grey band     Other Standing Lumbar Exercises  x 10 from step, then x 10 from floor 25# -cues for lat squeeze to avoid rounding upper back     Other Standing Lumbar Exercises  standing palloff press double green band x 15 each way , cues for technique and abdominal draw in       Lumbar Exercises: Supine   Bridge  10 reps    Bridge with clamshell  10 reps    Bridge with [employer] Limitations  green band  Lumbar Exercises: Sidelying   Clam  Right;Left;15 reps    Hip Abduction  10 reps    Hip Abduction Limitations  2 sets each                PT Short Term Goals - 10/20/18 1108      PT SHORT TERM GOAL #1   Title  She will be independent with initial hEP    Status  Achieved      PT SHORT TERM GOAL #2   Title  She will report 10% decr pain in legs on stanidng from chair.    Baseline  50% improvement    Status  Achieved        PT Long Term Goals - 11/10/18 0853      PT LONG TERM GOAL #1   Title  She will be independent with all HEP issued    Baseline  independent with HEP thus far    Period  Weeks    Status  Achieved      PT LONG TERM GOAL #2   Title  She will report pain decreased  50% or more and symptoms in legs don't appear for 5-10 min after standing to wlk.    Time  6    Period  Weeks    Status  Achieved      PT LONG TERM GOAL #3   Title  Max pain will be 5/10 in back and legs    Baseline  max pain about a six and does not  last as long.    Time  4    Period  Weeks    Status  Partially Met      PT LONG TERM GOAL #4   Title  stand as long as needed with minimal to no lower body pain    Baseline  can tolerate 30 minutes    Time  4    Period  Weeks    Status  On-going            Plan - 11/10/18 1610    Clinical Impression Statement  [name] reports some mild soreness after last session's therex and no pain. She reports aquiring a stretch, strap, ketle bell and ankle weight for HEP. Overall much improved. Progressed lateral hip strength with fatigue during side hip abduction.    PT Next Visit Plan  progress to standing functional core strengthening., continue gluteal strength    PT Home Exercise Plan  Single knee to chest, pelvic tilt, LTR, figure 4 push and pull, hamstring stretch, dead lift       Patient will benefit from skilled therapeutic intervention in order to improve the following deficits and impairments:  Pain, Obesity, Difficulty walking, Decreased range of motion, Decreased activity tolerance, Postural dysfunction  Visit Diagnosis: Abnormal posture  Chronic bilateral low back pain with bilateral sciatica  Stiffness of right hip, not elsewhere classified  Stiffness of left hip, not elsewhere classified     Problem List Patient Active Problem List   Diagnosis Date Noted  . Bilateral sciatica 09/16/2018  . Neck fullness 10/28/2017  . Allergic rhinitis 05/11/2017  . Vitamin B12 deficiency 09/25/2016  . Hyperlipidemia 09/24/2016  . Vitamin D deficiency 09/24/2016  . Acute bilateral low back pain with right-sided sciatica 10/09/2014  . GERD (gastroesophageal reflux disease) 10/09/2014  . Right knee pain 05/06/2011  . DUB (dysfunctional uterine bleeding) 04/04/2011  . Diastolic heart failure ([place]) 11/29/2010  . Hyperthyroidism 11/29/2010  . Essential hypertension 03/28/2010  Dorene Ar, [place] 11/10/2018, 9:27 AM  [place], [place], [address] Phone: [phone]   Fax:  [phone]  Name: [name] MRN: [id number] Date of Birth: 08/21/1965

## 2018-11-15 ENCOUNTER — Ambulatory Visit: Payer: Medicaid Other | Admitting: Physical Therapy

## 2018-11-15 ENCOUNTER — Other Ambulatory Visit: Payer: Self-pay

## 2018-11-15 ENCOUNTER — Encounter: Payer: Self-pay | Admitting: Physical Therapy

## 2018-11-15 DIAGNOSIS — G8929 Other chronic pain: Secondary | ICD-10-CM | POA: Diagnosis not present

## 2018-11-15 DIAGNOSIS — R293 Abnormal posture: Secondary | ICD-10-CM

## 2018-11-15 DIAGNOSIS — M5442 Lumbago with sciatica, left side: Secondary | ICD-10-CM | POA: Diagnosis not present

## 2018-11-15 DIAGNOSIS — M5441 Lumbago with sciatica, right side: Secondary | ICD-10-CM | POA: Diagnosis not present

## 2018-11-15 DIAGNOSIS — M25652 Stiffness of left hip, not elsewhere classified: Secondary | ICD-10-CM | POA: Diagnosis not present

## 2018-11-15 DIAGNOSIS — M25651 Stiffness of right hip, not elsewhere classified: Secondary | ICD-10-CM

## 2018-11-15 NOTE — Therapy (Addendum)
[facility] [address] [place], [place], [address] Phone: [phone]   Fax:  [phone]  Physical Therapy Treatment/Discharge  Patient Details  Name: [name] MRN: [id number] Date of Birth: 09/02/1965 Referring Provider (PT): Scott Bland, DO   Encounter Date: 11/15/2018  PT End of Session - 11/15/18 0942    Visit Number  10    Number of Visits  12    Date for PT Re-Evaluation  11/19/18    Authorization Type  MCD- 8 more visits    Authorization Time Period  10/22/18-11/18/18    Authorization - Visit Number  5    Authorization - Number of Visits  8    PT Start Time  0930    PT Stop Time  1008    PT Time Calculation (min)  38 min       Past Medical History:  Diagnosis Date  . Cough due to angiotensin-converting enzyme inhibitor 05/27/2010  . Diastolic heart failure 02/2010   NEW ON ECHO 02/2010  . Hemoptysis 02/2010   DUE TO ALV HGE...ANA NEGATIVE...DEEMED DUE TO DIASTOLIC CHF  . HEMOPTYSIS UNSPECIFIED 03/28/2010   Followed in Pulmonary clinic/ [facility]/ Ramaswamy Complex med regimen --med calendar 10/10/2010  3 episodes all related to hypertension with diastolic chf - last august 2012. CXR clear 11/21/10    . History of anemia 11/29/2010   Last Hb 10.1 on 08/21 baseline around 9.   . Hypertension    DX SEVERAL DECADES YRS AGO- UNTRREATED UNTIL ADMISSION 02/2010  . Hyperthyroidism   . PPD positive    NEGATIVE DURING CHILDHOOD IN [place]..1ST + TEST 1993 DURING IMMIGRATION GRREN CARD CLEARENCE..CXR NORMAL 1993 PER HX..s/p INR Rx AT [facility]-6MONTHS..REPEAT + 02/2010    Past Surgical History:  Procedure Laterality Date  . CESAREAN SECTION    . TUBAL LIGATION      There were no vitals filed for this visit.      [facility] PT Assessment - 11/15/18 0001      AROM   Lumbar Flexion  to the floor    Lumbar Extension  22    Lumbar - Right Rotation  --   WNL   Lumbar - Left Rotation  --   WNL                   [facility] Adult PT Treatment/Exercise - 11/15/18 0001      Lumbar Exercises: Stretches   Active Hamstring Stretch  Left;Right;2 reps;30 seconds    Lower Trunk Rotation Limitations  5 sec X 10 reps    ITB Stretch  Left;Right;2 reps;30 seconds    Piriformis Stretch  Left;Right;30 seconds;2 reps    Piriformis Stretch Limitations  push and pull modified       Lumbar Exercises: Aerobic   Nustep  L5 X 6 min LE/UE      Lumbar Exercises: Standing   Row  20 reps    Row Limitations  grey     Shoulder Extension  20 reps    Shoulder Extension Limitations  grey     Other Standing Lumbar Exercises  x 20 from 8 inch step , 25# Kettle Bell    Other Standing Lumbar Exercises  standing palloff press double green band x 20 each way , cues for technique and abdominal draw in       Lumbar Exercises: Supine   Bridge with clamshell  10 reps    Bridge with Ball Squeeze Limitations  green band         Lumbar Exercises: Sidelying   Hip Abduction  15 reps             PT Education - 11/15/18 0953    Education Details  HEP    Person(s) Educated  Patient    Methods  Explanation;Handout    Comprehension  Verbalized understanding       PT Short Term Goals - 10/20/18 1108      PT SHORT TERM GOAL #1   Title  She will be independent with initial hEP    Status  Achieved      PT SHORT TERM GOAL #2   Title  She will report 10% decr pain in legs on stanidng from chair.    Baseline  50% improvement    Status  Achieved        PT Long Term Goals - 11/15/18 0941      PT LONG TERM GOAL #1   Title  She will be independent with all HEP issued    Baseline  independent with HEP thus far    Time  4    Period  Weeks    Status  Achieved      PT LONG TERM GOAL #2   Title  She will report pain decreased  50% or more and symptoms in legs don't appear for 5-10 min after standing to wlk.    Time  6    Period  Weeks    Status  Achieved      PT LONG TERM GOAL #3   Title  Max  pain will be 5/10 in back and legs    Baseline  max pain about a six and does not last as long.    Period  Weeks    Status  Partially Met      PT LONG TERM GOAL #4   Title  stand as long as needed with minimal to no lower body pain    Baseline  can tolerate 30 minutes improved from 10 minutes at eval    Time  4    Period  Weeks    Status  Partially Met            Plan - 11/15/18 0954    Clinical Impression Statement  Ms. Buckholtz reports no pain today and is performing HEP consistently. She has met or partially met most goals. She is agreeable to discharge today. Reviewed all HEP and issued handouts for most recent exercises for HEP.    PT Next Visit Plan  progress to standing functional core strengthening., continue gluteal strength    PT Home Exercise Plan  Single knee to chest, pelvic tilt, LTR, figure 4 push and pull, hamstring stretch, dead lift, palloff press green band, side hip abduction       Patient will benefit from skilled therapeutic intervention in order to improve the following deficits and impairments:  Pain, Obesity, Difficulty walking, Decreased range of motion, Decreased activity tolerance, Postural dysfunction  Visit Diagnosis: Abnormal posture  Chronic bilateral low back pain with bilateral sciatica  Stiffness of right hip, not elsewhere classified  Stiffness of left hip, not elsewhere classified     Problem List Patient Active Problem List   Diagnosis Date Noted  . Bilateral sciatica 09/16/2018  . Neck fullness 10/28/2017  . Allergic rhinitis 05/11/2017  . Vitamin B12 deficiency 09/25/2016  . Hyperlipidemia 09/24/2016  . Vitamin D deficiency 09/24/2016  . Acute bilateral low back pain with right-sided sciatica 10/09/2014  . GERD (gastroesophageal reflux disease)  10/09/2014  . Right knee pain 05/06/2011  . DUB (dysfunctional uterine bleeding) 04/04/2011  . Diastolic heart failure (HCC) 11/29/2010  . Hyperthyroidism 11/29/2010  . Essential  hypertension 03/28/2010    Donoho, Jessica McGee, PTA 11/15/2018, 10:06 AM  [facility] [address] [place], [place], [address] Phone: [phone]   Fax:  [phone]  Name: [name] MRN: [id number] Date of Birth: 05/17/1965  PHYSICAL THERAPY DISCHARGE SUMMARY  Visits from Start of Care: 10  Current functional level related to goals / functional outcomes: See above   Remaining deficits: See above   Education / Equipment: HEP Plan: Patient agrees to discharge.  Patient goals were partially met. Patient is being discharged due to being pleased with the current functional level.  ?????    Steve Chasse PT 11/15/18 

## 2018-11-17 ENCOUNTER — Ambulatory Visit: Payer: Medicaid Other | Admitting: Physical Therapy

## 2018-11-24 DIAGNOSIS — H524 Presbyopia: Secondary | ICD-10-CM | POA: Diagnosis not present

## 2018-11-24 DIAGNOSIS — H52223 Regular astigmatism, bilateral: Secondary | ICD-10-CM | POA: Diagnosis not present

## 2018-11-24 DIAGNOSIS — H5203 Hypermetropia, bilateral: Secondary | ICD-10-CM | POA: Diagnosis not present

## 2018-11-24 DIAGNOSIS — H40013 Open angle with borderline findings, low risk, bilateral: Secondary | ICD-10-CM | POA: Diagnosis not present

## 2018-12-20 DIAGNOSIS — H40003 Preglaucoma, unspecified, bilateral: Secondary | ICD-10-CM | POA: Diagnosis not present

## 2018-12-20 DIAGNOSIS — H2513 Age-related nuclear cataract, bilateral: Secondary | ICD-10-CM | POA: Diagnosis not present

## 2018-12-26 DIAGNOSIS — Z20828 Contact with and (suspected) exposure to other viral communicable diseases: Secondary | ICD-10-CM | POA: Diagnosis not present

## 2019-01-23 ENCOUNTER — Other Ambulatory Visit: Payer: Self-pay | Admitting: Family Medicine

## 2019-01-23 DIAGNOSIS — G8929 Other chronic pain: Secondary | ICD-10-CM

## 2019-01-23 DIAGNOSIS — M549 Dorsalgia, unspecified: Secondary | ICD-10-CM

## 2019-02-04 ENCOUNTER — Other Ambulatory Visit: Payer: Self-pay | Admitting: Family Medicine

## 2019-02-04 DIAGNOSIS — I10 Essential (primary) hypertension: Secondary | ICD-10-CM

## 2019-02-04 NOTE — Telephone Encounter (Signed)
Amlodipine refilled.  I have placed a future lab order for an overdue BMP for this patient, they are also about due for a pap so please call and ask them to come in for these items (if they want a female doctor for pap, please schedule with female doctor)  -Dr. Criss Rosales

## 2019-02-10 NOTE — Telephone Encounter (Signed)
Patient called and LVM to return call to schedule appointment.   Talbot Grumbling, RN

## 2019-02-16 NOTE — Telephone Encounter (Signed)
Contacted pt and appointment scheduled for lab and informed her that her PAP is not due until March and she should call then to get this scheduled. April Zimmerman Rumple, CMA

## 2019-02-17 ENCOUNTER — Ambulatory Visit (INDEPENDENT_AMBULATORY_CARE_PROVIDER_SITE_OTHER): Payer: Medicaid Other | Admitting: *Deleted

## 2019-02-17 ENCOUNTER — Other Ambulatory Visit: Payer: Self-pay

## 2019-02-17 DIAGNOSIS — I10 Essential (primary) hypertension: Secondary | ICD-10-CM

## 2019-02-18 ENCOUNTER — Encounter: Payer: Self-pay | Admitting: Family Medicine

## 2019-02-18 DIAGNOSIS — Z1231 Encounter for screening mammogram for malignant neoplasm of breast: Secondary | ICD-10-CM

## 2019-02-18 LAB — BASIC METABOLIC PANEL
BUN/Creatinine Ratio: 21 (ref 9–23)
BUN: 18 mg/dL (ref 6–24)
CO2: 20 mmol/L (ref 20–29)
Calcium: 9.6 mg/dL (ref 8.7–10.2)
Chloride: 107 mmol/L — ABNORMAL HIGH (ref 96–106)
Creatinine, Ser: 0.87 mg/dL (ref 0.57–1.00)
GFR calc Af Amer: 87 mL/min/{1.73_m2} (ref >59)
GFR calc non Af Amer: 76 mL/min/{1.73_m2} (ref >59)
Glucose: 102 mg/dL — ABNORMAL HIGH (ref 65–99)
Potassium: 4.2 mmol/L (ref 3.5–5.2)
Sodium: 144 mmol/L (ref 134–144)

## 2019-02-24 ENCOUNTER — Other Ambulatory Visit: Payer: Self-pay

## 2019-02-24 ENCOUNTER — Ambulatory Visit
Admission: RE | Admit: 2019-02-24 | Discharge: 2019-02-24 | Disposition: A | Discharge status: Home / Self Care | Payer: Medicaid Other | Source: Ambulatory Visit | Attending: Family Medicine | Admitting: Family Medicine

## 2019-02-24 DIAGNOSIS — Z1231 Encounter for screening mammogram for malignant neoplasm of breast: Secondary | ICD-10-CM

## 2019-03-07 DIAGNOSIS — Z20828 Contact with and (suspected) exposure to other viral communicable diseases: Secondary | ICD-10-CM | POA: Diagnosis not present

## 2019-03-08 ENCOUNTER — Other Ambulatory Visit: Payer: Medicaid Other

## 2019-04-17 ENCOUNTER — Emergency Department (HOSPITAL_COMMUNITY)
Admission: EM | Admit: 2019-04-17 | Discharge: 2019-04-17 | Disposition: A | Discharge status: Home / Self Care | Payer: Medicaid Other | Attending: Emergency Medicine | Admitting: Emergency Medicine

## 2019-04-17 ENCOUNTER — Encounter (HOSPITAL_COMMUNITY): Payer: Self-pay | Admitting: Emergency Medicine

## 2019-04-17 ENCOUNTER — Emergency Department (HOSPITAL_COMMUNITY): Payer: Medicaid Other

## 2019-04-17 ENCOUNTER — Other Ambulatory Visit: Payer: Self-pay

## 2019-04-17 DIAGNOSIS — Z79899 Other long term (current) drug therapy: Secondary | ICD-10-CM | POA: Diagnosis not present

## 2019-04-17 DIAGNOSIS — M25512 Pain in left shoulder: Secondary | ICD-10-CM | POA: Diagnosis not present

## 2019-04-17 DIAGNOSIS — I10 Essential (primary) hypertension: Secondary | ICD-10-CM | POA: Insufficient documentation

## 2019-04-17 DIAGNOSIS — E039 Hypothyroidism, unspecified: Secondary | ICD-10-CM | POA: Insufficient documentation

## 2019-04-17 DIAGNOSIS — M67912 Unspecified disorder of synovium and tendon, left shoulder: Secondary | ICD-10-CM

## 2019-04-17 DIAGNOSIS — M7582 Other shoulder lesions, left shoulder: Secondary | ICD-10-CM | POA: Diagnosis not present

## 2019-04-17 MED ORDER — KETOROLAC TROMETHAMINE 30 MG/ML IJ SOLN
30.0000 mg | Freq: Once | INTRAMUSCULAR | Status: AC
  Administered 2019-04-17: 30 mg via INTRAMUSCULAR
  Filled 2019-04-17: qty 1

## 2019-04-17 NOTE — ED Triage Notes (Signed)
Per pt, states left arm pain since yesterday-no injury or trauma

## 2019-04-17 NOTE — ED Provider Notes (Signed)
[facility] DEPT Provider Note   CSN: [id number] Arrival date & time: 04/17/19  1330     History Chief Complaint  Patient presents with  . Arm Pain    [name] is a 54 y.o. female with PMHx hyperthyroidism, HTN who presents to the ED today complaining of gradual onset, constant, sharp, L shoulder pain with radiating down entire LUE with movement that began yesterday.  Patient endorses she was about to go to sleep and was rolling over onto her left side when she noticed her shoulder hurting.  No obvious injury to the shoulder.  She states that she went to bed and woke up this morning with worsening pain.  She took meloxicam which she typically takes for her right knee pain as well as Aleve without relief.  States worsening pain with movement of her shoulder.  She denies any weakness, numbness, tingling.  No chest pain or shortness of breath.  States that she will drop anything she holds in this arm however it is more due to pain.  No headache, vision changes, unilateral weakness or numbness, speech changes, confusion, any other associated symptoms.  No history of stroke.   The history is provided by the patient and medical records.       Past Medical History:  Diagnosis Date  . Cough due to angiotensin-converting enzyme inhibitor 05/27/2010  . Diastolic heart failure Q000111Q   NEW ON ECHO 02/2010  . Hemoptysis 02/2010   DUE TO ALV HGE.Marland KitchenMarland KitchenANA NEGATIVE...DEEMED DUE TO DIASTOLIC CHF  . HEMOPTYSIS UNSPECIFIED 03/28/2010   Followed in Pulmonary clinic/ [facility]/ Ramasw[facility] Complex med regimen --med calendar 10/10/2010  3 episodes all related to hypertension with diastolic chf - last august 2012. CXR clear 11/21/10    . History of anemia 11/29/2010   Last Hb 10.1 on 08/21 baseline around 9.   . Hypertension    DX SEVERAL DECADES YRS AGO- UNTRREATED UNTIL ADMISSION 02/2010  . Hyperthyroidism   . PPD positive    NEGATIVE DURING CHILDHOOD IN [facility]..1ST +  TEST 1993 DURING IMMIGRATION GRREN CARD CLEARENCE.Marland KitchenCXR NORMAL 1993 PER HX..s/p INR Rx AT [facility] [id number].Marland KitchenREPEAT + 02/2010    Patient Active Problem List   Diagnosis Date Noted  . Bilateral sciatica 09/16/2018  . Neck fullness 10/28/2017  . Allergic rhinitis 05/11/2017  . Vitamin B12 deficiency 09/25/2016  . Hyperlipidemia 09/24/2016  . Vitamin D deficiency 09/24/2016  . Acute bilateral low back pain with right-sided sciatica 10/09/2014  . GERD (gastroesophageal reflux disease) 10/09/2014  . Right knee pain 05/06/2011  . DUB (dysfunctional uterine bleeding) 04/04/2011  . Diastolic heart failure ([place]) 11/29/2010  . Hyperthyroidism 11/29/2010  . Essential hypertension 03/28/2010    Past Surgical History:  Procedure Laterality Date  . CESAREAN SECTION    . TUBAL LIGATION       OB History    Gravida  7   Para      Term      Preterm      AB  2   Living  5     SAB  1   TAB  1   Ectopic      Multiple      Live Births              Family History  Problem Relation Age of Onset  . Hypertension Father   . Sudden death Father   . Hyperlipidemia Neg Hx   . Heart attack Neg Hx   . Diabetes Neg  Hx   . Colon cancer Neg Hx     Social History   Tobacco Use  . Smoking status: Never Smoker  . Smokeless tobacco: Never Used  Substance Use Topics  . Alcohol use: No  . Drug use: No    Home Medications Prior to Admission medications   Medication Sig Start Date End Date Taking? Authorizing Provider  amLODipine (NORVASC) 5 MG tablet TAKE 1 TABLET(5 MG) BY MOUTH DAILY Patient taking differently: Take 5 mg by mouth daily.  02/04/19  Yes Bland, Scott, DO  meloxicam (MOBIC) 15 MG tablet TAKE 1 TABLET BY MOUTH DAILY Patient taking differently: Take 15 mg by mouth daily.  01/24/19  Yes Bland, Scott, DO  metoprolol tartrate (LOPRESSOR) 25 MG tablet TAKE 1 TABLET BY MOUTH TWICE DAILY Patient taking differently: Take 25 mg by mouth 2 (two) times daily.   10/05/18  Yes Sherene Sires, DO  Multiple Vitamins-Minerals (MULTIVITAMIN WOMEN 50+) TABS Take 1 tablet by mouth daily. 09/25/16  Yes Mercy Riding, MD  ferrous sulfate 325 (65 FE) MG tablet TAKE 1 TABLET(325 MG) BY MOUTH DAILY WITH BREAKFAST Patient not taking: Reported on 11/11/2016 03/18/16   Janora Norlander, DO  fluticasone (FLONASE) 50 MCG/ACT nasal spray Place 2 sprays into both nostrils daily. Patient not taking: Reported on 04/17/2019 05/11/17   Mercy Riding, MD  gabapentin (NEURONTIN) 100 MG capsule Take 1 capsule (100 mg total) by mouth 3 (three) times daily. Patient not taking: Reported on 04/17/2019 09/15/18   Sherene Sires, DO  predniSONE (DELTASONE) 50 MG tablet Take 1 tablet (50 mg total) by mouth daily with breakfast. Patient not taking: Reported on 04/17/2019 10/10/18   Dalia Heading, PA-C    Allergies    Patient has no known allergies.  Review of Systems   Review of Systems  Constitutional: Negative for chills and fever.  Musculoskeletal: Positive for arthralgias.  Neurological: Negative for weakness and numbness.    Physical Exam Updated Vital Signs BP (!) 151/83 (BP Location: Right Arm)   Pulse 71   Temp 98.4 F (36.9 C) (Oral)   Resp 18   Ht 5\' 5"  (1.651 m)   Wt 100.2 kg   LMP  (LMP Unknown) Comment: pt states 1.5 years ago   SpO2 100%   BMI 36.78 kg/m   Physical Exam Vitals and nursing note reviewed.  Constitutional:      Appearance: She is not ill-appearing.  HENT:     Head: Normocephalic and atraumatic.  Eyes:     Conjunctiva/sclera: Conjunctivae normal.  Cardiovascular:     Rate and Rhythm: Normal rate and regular rhythm.     Pulses: Normal pulses.  Pulmonary:     Effort: Pulmonary effort is normal.     Breath sounds: Normal breath sounds. No wheezing, rhonchi or rales.  Musculoskeletal:     Comments: Pt holding left arm to side attempting to note move shoulder. + TTP to L shoulder joint. Active and passive ROM limited due to pain. No  tenderness to humerus, elbow, forearm, wrist, or hand on left side. 2+ radial pulse. Cap refill < 2 seconds. Grip strength 4/5 compared to 5/5 on right. Pt unable to tolerate movement to assess Empty Can test or Neer's.   Skin:    General: Skin is warm and dry.     Coloration: Skin is not jaundiced.  Neurological:     Mental Status: She is alert.     ED Results / Procedures / Treatments   Labs (all  labs ordered are listed, but only abnormal results are displayed) Labs Reviewed - No data to display  EKG None  Radiology DG Shoulder Left  Result Date: 04/17/2019 CLINICAL DATA:  Left shoulder pain and limited range of motion. No known injury. EXAM: LEFT SHOULDER - 2+ VIEW COMPARISON:  None. FINDINGS: There is no fracture or dislocation. Slight calcification in the region of the distal supraspinatus tendon at its insertion on the greater tuberosity of the proximal humerus. Glenohumeral joint and AC joint appear normal. 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 Electronically Signed   By: Lorriane Shire M.D.   On: 04/17/2019 14:32    Procedures Procedures (including critical care time)  Medications Ordered in ED Medications  ketorolac (TORADOL) 30 MG/ML injection 30 mg (has no administration in time range)    ED Course  I have reviewed the triage vital signs and the nursing notes.  Pertinent labs & imaging results that were available during my care of the patient were reviewed by me and considered in my medical decision making (see chart for details).    MDM Rules/Calculators/A&P                      54 year old female presents to the ED today complaining of atraumatic left shoulder pain that radiates down the entire left arm x1 day.  Difficulty ranging due to pain.  Limited range of motion with passive and active motion.  There does not appear erythematous or edematous today.  No increased warmth.  No concern for septic joint.  Patient is  afebrile, nontachycardic and nontachypneic.  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  Will obtain x-ray at this time.  There is concern for possible adhesive capsulitis vs rotator cuff impingement vs bone spurs. Will reevaluate after xray. No concerning symptoms for a stroke today. Pt reports dropping objects due to pain. Her sensation is not diminished and she has no focal neuro deficits. No involvement of LLE.  Xray with findings consistent with calcific tendinopathy. Will apply arm sling and toradol for pain medicine in the ED. Pt to follow up with ortho for further evaluation. May benefit from intraarticular steroid injection outpatient. Advised to take Ibuprofen and Tylenol PRN for pain and to wear shoulder sling until she can see ortho. Pt is in agreement with plan and stable for discharge home.   This note was prepared using Dragon voice recognition software and may include unintentional dictation errors due to the inherent limitations of voice recognition software.  Final Clinical Impression(s) / ED Diagnoses Final diagnoses:  Acute pain of left shoulder  Tendinopathy of left shoulder    Rx / DC Orders ED Discharge Orders    None       Discharge Instructions     Your xray showed findings consistent with calcific tendinopathy which is calcifications along the muscle insertions of your shoulder joint Please wear sling for comfort You may take Ibuprofen and Tylenol as needed for pain Follow up with Dr. Erlinda Hong orthopedist for further evaluation and treatment of your shoulder pain       Eustaquio Maize, PA-C 04/17/19 George, Gwenyth Allegra, MD 04/17/19 1534

## 2019-04-17 NOTE — Discharge Instructions (Signed)
Your xray showed findings consistent with calcific tendinopathy which is calcifications along the muscle insertions of your shoulder joint Please wear sling for comfort You may take Ibuprofen and Tylenol as needed for pain Follow up with Dr. Erlinda Hong orthopedist for further evaluation and treatment of your shoulder pain

## 2019-04-21 ENCOUNTER — Other Ambulatory Visit: Payer: Self-pay

## 2019-04-21 ENCOUNTER — Ambulatory Visit (INDEPENDENT_AMBULATORY_CARE_PROVIDER_SITE_OTHER): Payer: Medicaid Other | Admitting: Orthopaedic Surgery

## 2019-04-21 ENCOUNTER — Encounter: Payer: Self-pay | Admitting: Physician Assistant

## 2019-04-21 ENCOUNTER — Ambulatory Visit: Payer: Self-pay

## 2019-04-21 DIAGNOSIS — M25512 Pain in left shoulder: Secondary | ICD-10-CM

## 2019-04-21 MED ORDER — HYDROCODONE-ACETAMINOPHEN 5-325 MG PO TABS: 1.0000 | ORAL_TABLET | Freq: Four times a day (QID) | ORAL | 0 refills | Status: DC | PRN

## 2019-04-21 NOTE — Progress Notes (Signed)
Office Visit Note   Patient: [name]           Date of Birth: 06/12/1965           MRN: [id number] Visit Date: 04/21/2019              Requested by: Sherene Sires, DO [address],  [place] [address] PCP: Sherene Sires, DO   Assessment & Plan: Visit Diagnoses:  1. Acute pain of left shoulder     Plan: Impression is left shoulder pain with very symptomatic AC joint.  I have referred the patient to Dr. Junius Roads for ultrasound-guided cortisone injection to the left [facility] joint.  She will follow up with [place] as needed.  Follow-Up Instructions: Return if symptoms worsen or fail to improve.   Orders:  Orders Placed This Encounter  Procedures  . US Guided Needle Placement - No Linked Charges   No orders of the defined types were placed in this encounter.     Procedures: No procedures performed   Clinical Data: No additional findings.   Subjective: Chief Complaint  Patient presents with  . Left Shoulder - Pain     HPI patient is a pleasant 54 year old female who comes in today with left shoulder pain.  This began this past Saturday, 04/16/2019.  No known injury or change in activity.  Her pain has worsened over the past 2 days.  She was seen in the ED where x-rays were obtained.  These were negative for acute findings other than some mild calcifications to the supraspinatus.  She comes in today for further evaluation treatment recommendation.  The pain she has is primarily to the anterior and superior aspects of the shoulder.  She notes a constant sharp pain with any movement of the shoulder.  She has been taking Tylenol without relief of symptoms.  No numbness, tingling or burning.  She notes that she had a cortisone injection in her shoulder in the ED which sounds more like intramuscular.  Otherwise, no shoulder injection.  Review of Systems as detailed in HPI.  All others reviewed and are negative.   Objective: Vital Signs: LMP  (LMP Unknown) Comment: pt states 1.5  years ago   Physical Exam well-developed well-nourished female no acute distress.  Alert and oriented x3.  Ortho Exam examination of her left shoulder reveals approximately 50% active range of motion in all planes.  Moderately positive empty can and cross body.  Marked tenderness to the [facility] joint.  4 out of 5 strength throughout which I believe is pain mediated.  She is neurovascular intact distally.  Specialty Comments:  No specialty comments available.  Imaging: No new imaging   PMFS History: Patient Active Problem List   Diagnosis Date Noted  . Bilateral sciatica 09/16/2018  . Neck fullness 10/28/2017  . Allergic rhinitis 05/11/2017  . Vitamin B12 deficiency 09/25/2016  . Hyperlipidemia 09/24/2016  . Vitamin D deficiency 09/24/2016  . Acute bilateral low back pain with right-sided sciatica 10/09/2014  . GERD (gastroesophageal reflux disease) 10/09/2014  . Right knee pain 05/06/2011  . DUB (dysfunctional uterine bleeding) 04/04/2011  . Diastolic heart failure ([place]) 11/29/2010  . Hyperthyroidism 11/29/2010  . Essential hypertension 03/28/2010   Past Medical History:  Diagnosis Date  . Cough due to angiotensin-converting enzyme inhibitor 05/27/2010  . Diastolic heart failure Q000111Q   NEW ON ECHO 02/2010  . Hemoptysis 02/2010   DUE TO ALV HGE.Marland KitchenMarland KitchenANA NEGATIVE...DEEMED DUE TO DIASTOLIC CHF  . HEMOPTYSIS UNSPECIFIED 03/28/2010  Followed in Pulmonary clinic/ [facility]/ Ramaswamy Complex med regimen --med calendar 10/10/2010  3 episodes all related to hypertension with diastolic chf - last august 2012. CXR clear 11/21/10    . History of anemia 11/29/2010   Last Hb 10.1 on 08/21 baseline around 9.   . Hypertension    DX SEVERAL DECADES YRS AGO- UNTRREATED UNTIL ADMISSION 02/2010  . Hyperthyroidism   . PPD positive    NEGATIVE DURING CHILDHOOD IN [facility]..1ST + TEST 1993 DURING IMMIGRATION GRREN CARD CLEARENCE.Marland KitchenCXR NORMAL 1993 PER HX..s/p INR Rx AT [facility]  [id number].Marland KitchenREPEAT + 02/2010    Family History  Problem Relation Age of Onset  . Hypertension Father   . Sudden death Father   . Hyperlipidemia Neg Hx   . Heart attack Neg Hx   . Diabetes Neg Hx   . Colon cancer Neg Hx     Past Surgical History:  Procedure Laterality Date  . CESAREAN SECTION    . TUBAL LIGATION     Social History   Occupational History  . Not on file  Tobacco Use  . Smoking status: Never Smoker  . Smokeless tobacco: Never Used  Substance and Sexual Activity  . Alcohol use: No  . Drug use: No  . Sexual activity: Yes    Birth control/protection: Surgical, Post-menopausal    Comment: BTL 54 years old

## 2019-04-21 NOTE — Progress Notes (Signed)
Subjective: She is here for ultrasound-guided left AC joint injection.  Objective: Point tender over the left AC joint.  Procedure: Ultrasound-guided left AC joint injection: After sterile prep with Betadine, injected 3 cc 1% lidocaine without epinephrine and 40 mg methylprednisolone using out of plane approach without complication.  Hydrocodone given for pain.

## 2019-05-12 ENCOUNTER — Other Ambulatory Visit: Payer: Self-pay | Admitting: Family Medicine

## 2019-06-26 DIAGNOSIS — Z23 Encounter for immunization: Secondary | ICD-10-CM | POA: Diagnosis not present

## 2019-07-01 DIAGNOSIS — H5213 Myopia, bilateral: Secondary | ICD-10-CM | POA: Diagnosis not present

## 2019-07-19 DIAGNOSIS — Z23 Encounter for immunization: Secondary | ICD-10-CM | POA: Diagnosis not present

## 2019-08-02 DIAGNOSIS — H52223 Regular astigmatism, bilateral: Secondary | ICD-10-CM | POA: Diagnosis not present

## 2019-08-02 DIAGNOSIS — H524 Presbyopia: Secondary | ICD-10-CM | POA: Diagnosis not present

## 2019-08-03 ENCOUNTER — Other Ambulatory Visit: Payer: Self-pay | Admitting: Family Medicine

## 2019-08-03 DIAGNOSIS — M549 Dorsalgia, unspecified: Secondary | ICD-10-CM

## 2019-08-03 DIAGNOSIS — G8929 Other chronic pain: Secondary | ICD-10-CM

## 2019-08-14 ENCOUNTER — Other Ambulatory Visit: Payer: Self-pay | Admitting: Family Medicine

## 2019-09-16 DIAGNOSIS — Z20822 Contact with and (suspected) exposure to covid-19: Secondary | ICD-10-CM | POA: Diagnosis not present

## 2019-09-16 DIAGNOSIS — Z03818 Encounter for observation for suspected exposure to other biological agents ruled out: Secondary | ICD-10-CM | POA: Diagnosis not present

## 2019-11-20 ENCOUNTER — Other Ambulatory Visit: Payer: Self-pay | Admitting: Family Medicine

## 2020-02-14 ENCOUNTER — Other Ambulatory Visit: Payer: Self-pay | Admitting: Family Medicine

## 2020-02-14 DIAGNOSIS — M25561 Pain in right knee: Secondary | ICD-10-CM

## 2020-02-14 DIAGNOSIS — G8929 Other chronic pain: Secondary | ICD-10-CM

## 2020-02-14 DIAGNOSIS — M549 Dorsalgia, unspecified: Secondary | ICD-10-CM

## 2020-02-16 ENCOUNTER — Other Ambulatory Visit: Payer: Self-pay | Admitting: *Deleted

## 2020-02-16 DIAGNOSIS — I10 Essential (primary) hypertension: Secondary | ICD-10-CM

## 2020-02-16 MED ORDER — AMLODIPINE BESYLATE 5 MG PO TABS: 5.0000 mg | ORAL_TABLET | Freq: Every day | ORAL | 0 refills | Status: DC

## 2020-03-16 ENCOUNTER — Other Ambulatory Visit: Payer: Self-pay | Admitting: Family Medicine

## 2020-03-16 DIAGNOSIS — I10 Essential (primary) hypertension: Secondary | ICD-10-CM

## 2020-03-16 DIAGNOSIS — M25561 Pain in right knee: Secondary | ICD-10-CM

## 2020-03-16 DIAGNOSIS — G8929 Other chronic pain: Secondary | ICD-10-CM

## 2020-03-20 ENCOUNTER — Other Ambulatory Visit: Payer: Self-pay | Admitting: Family Medicine

## 2020-03-20 DIAGNOSIS — Z1231 Encounter for screening mammogram for malignant neoplasm of breast: Secondary | ICD-10-CM

## 2020-03-24 NOTE — Patient Instructions (Addendum)
Thank you for coming to see me today. It was a pleasure.  We will get some labs today.  If they are abnormal or we need to do something about them, I will call you.  If they are normal, I will send you a message on MyChart (if it is active) or a letter in the mail.  If you don't hear from [place] in 2 weeks, please call the office at the number below.  Please call  [facility] to see if you can are due for a repeat colonoscopy.   You are due for a PAP smear, please book an appointment for this soon.   Please follow-up with PCP as needed  If you have any questions or concerns, please do not hesitate to call the office at [phone].  Best,   Carollee Leitz, MD

## 2020-03-27 ENCOUNTER — Ambulatory Visit (INDEPENDENT_AMBULATORY_CARE_PROVIDER_SITE_OTHER): Payer: Medicaid Other | Admitting: Family Medicine

## 2020-03-27 ENCOUNTER — Encounter: Payer: Self-pay | Admitting: Family Medicine

## 2020-03-27 ENCOUNTER — Ambulatory Visit (HOSPITAL_BASED_OUTPATIENT_CLINIC_OR_DEPARTMENT_OTHER)
Admission: RE | Admit: 2020-03-27 | Discharge: 2020-03-27 | Disposition: A | Discharge status: Home / Self Care | Payer: Medicaid Other | Source: Ambulatory Visit | Attending: Family Medicine | Admitting: Family Medicine

## 2020-03-27 ENCOUNTER — Other Ambulatory Visit: Payer: Self-pay

## 2020-03-27 VITALS — BP 130/72 | HR 84 | Ht 65.0 in | Wt 238.2 lb

## 2020-03-27 DIAGNOSIS — I10 Essential (primary) hypertension: Secondary | ICD-10-CM

## 2020-03-27 DIAGNOSIS — R252 Cramp and spasm: Secondary | ICD-10-CM | POA: Diagnosis not present

## 2020-03-27 DIAGNOSIS — Z1231 Encounter for screening mammogram for malignant neoplasm of breast: Secondary | ICD-10-CM | POA: Diagnosis not present

## 2020-03-27 DIAGNOSIS — G8929 Other chronic pain: Secondary | ICD-10-CM

## 2020-03-27 DIAGNOSIS — M549 Dorsalgia, unspecified: Secondary | ICD-10-CM

## 2020-03-27 DIAGNOSIS — M25561 Pain in right knee: Secondary | ICD-10-CM

## 2020-03-27 DIAGNOSIS — R058 Other specified cough: Secondary | ICD-10-CM | POA: Diagnosis present

## 2020-03-27 LAB — POCT GLYCOSYLATED HEMOGLOBIN (HGB A1C): Hemoglobin A1C: 5.6 % (ref 4.0–5.6)

## 2020-03-27 MED ORDER — GABAPENTIN 100 MG PO CAPS: 200.0000 mg | ORAL_CAPSULE | Freq: Three times a day (TID) | ORAL | 3 refills | Status: DC

## 2020-03-27 MED ORDER — IPRATROPIUM BROMIDE 0.06 % NA SOLN: 2.0000 | Freq: Three times a day (TID) | NASAL | 12 refills | Status: DC

## 2020-03-27 MED ORDER — METOPROLOL TARTRATE 25 MG PO TABS: 25.0000 mg | ORAL_TABLET | Freq: Two times a day (BID) | ORAL | 3 refills | Status: DC

## 2020-03-27 MED ORDER — AMLODIPINE BESYLATE 5 MG PO TABS
ORAL_TABLET | ORAL | 2 refills | Status: DC
Start: 2020-03-27 — End: 2020-12-24

## 2020-03-27 NOTE — Progress Notes (Signed)
    SUBJECTIVE:   CHIEF COMPLAINT / HPI: dry cough, leg cramps and physical  Dry cough Ongoing for 2 months. Only occur when laughing.  Endorses nasal drainage with phlegm in throat and occasional wheezing when coughing.  Denies any fevers, shortness of breath or chest pain.  Reports having COVID 2020.  Lower leg cramps Ongoing for 2 years.  Only in lower extremities, and lasts about 2-3 mins.  Nothing relieves cramps.  Endorses drinking 4 gallons water daily.  Denies any night time pain or restless legs. Does not endorse any numbness or tingling.  History of bilateral lower back pain with right side sciatica.  Hypertension Asymptomatic.  Compliant with medications.  Requesting refill. Denies any chest pain, headaches or visual disturbances.   PERTINENT  PMH / PSH:   OBJECTIVE:   BP 130/72   Pulse 84   Ht 5\' 5"  (1.651 m)   Wt 238 lb 4 oz (108.1 kg)   LMP  (LMP Unknown) Comment: pt states 1.5 years ago   SpO2 98%   BMI 39.65 kg/m    General: Alert, no acute distress Cardio: Normal S1 and S2, RRR, no r/m/g Pulm: CTAB, normal work of breathing, no wheezing or crackles noted Abdomen: Bowel sounds normal. Abdomen soft and non-tender.  Extremities: No peripheral edema. Pulses present. ROM wnl. Sensory and motor intact Neuro: Cranial nerves grossly intact    ASSESSMENT/PLAN:   Dry cough Unclear etiology of cough given no triggers and only occurs when coughing.  Considered GERD given documented history, although patient reports no history of GERD and no symptoms.  Given history of seasonal allergies, dry cough and no fevers seems likely allergies playing role in cough. Had previously tried Flonase without relief. -Trial Atrovent nasal spray -Continue Zyrtec daily -Follow up in 2/52, if no improvement can trial PPI  Bilateral leg cramps Chronic concern.  Unclear etiology.  Considered restless leg, PVD, Venous insuffiency, Metabolic and electrolyte imbalance.  Less likely DVT  given no edema and chronicity of condition.  -A1c, CMet, TSH and CBC today -Increase Gabapentin to 200 mg TID -Follow up in 2/52  Essential hypertension At goal.   -Refill Amlodipine 5 daily -Refill Metoprolol 25 mg BID -Follow up 6/12   Health maintenance  Covid vaccine: received 2 vaccines Flu vaccine: due 04/19/2020 Colonoscopy: 2018 Mammogram:03/27/2020 Pap: due 2022  Carollee Leitz, MD [place]

## 2020-03-28 ENCOUNTER — Encounter: Payer: Self-pay | Admitting: Family Medicine

## 2020-03-28 DIAGNOSIS — R252 Cramp and spasm: Secondary | ICD-10-CM | POA: Insufficient documentation

## 2020-03-28 DIAGNOSIS — R058 Other specified cough: Secondary | ICD-10-CM | POA: Insufficient documentation

## 2020-03-28 HISTORY — DX: Other specified cough: R05.8

## 2020-03-28 HISTORY — DX: Cramp and spasm: R25.2

## 2020-03-28 LAB — COMPREHENSIVE METABOLIC PANEL
ALT: 21 IU/L (ref 0–32)
AST: 20 IU/L (ref 0–40)
Albumin/Globulin Ratio: 1.7 (ref 1.2–2.2)
Albumin: 4.5 g/dL (ref 3.8–4.9)
Alkaline Phosphatase: 67 IU/L (ref 44–121)
BUN/Creatinine Ratio: 17 (ref 9–23)
BUN: 15 mg/dL (ref 6–24)
Bilirubin Total: <0.2 mg/dL (ref 0.0–1.2)
CO2: 26 mmol/L (ref 20–29)
Calcium: 9.9 mg/dL (ref 8.7–10.2)
Chloride: 103 mmol/L (ref 96–106)
Creatinine, Ser: 0.86 mg/dL (ref 0.57–1.00)
Globulin, Total: 2.6 g/dL (ref 1.5–4.5)
Glucose: 134 mg/dL — ABNORMAL HIGH (ref 65–99)
Potassium: 4.2 mmol/L (ref 3.5–5.2)
Sodium: 143 mmol/L (ref 134–144)
Total Protein: 7.1 g/dL (ref 6.0–8.5)
eGFR: 80 mL/min/{1.73_m2} (ref >59)

## 2020-03-28 LAB — LIPID PANEL
Chol/HDL Ratio: 4.3 ratio (ref 0.0–4.4)
Cholesterol, Total: 202 mg/dL — ABNORMAL HIGH (ref 100–199)
HDL: 47 mg/dL (ref >39)
LDL Chol Calc (NIH): 132 mg/dL — ABNORMAL HIGH (ref 0–99)
Triglycerides: 126 mg/dL (ref 0–149)
VLDL Cholesterol Cal: 23 mg/dL (ref 5–40)

## 2020-03-28 LAB — HCV INTERPRETATION

## 2020-03-28 LAB — HCV AB W REFLEX TO QUANT PCR: HCV Ab: <0.1 s/co ratio (ref 0.0–0.9)

## 2020-03-28 LAB — TSH: TSH: 1.47 u[IU]/mL (ref 0.450–4.500)

## 2020-03-28 NOTE — Assessment & Plan Note (Signed)
Chronic concern.  Unclear etiology.  Considered restless leg, PVD, Venous insuffiency, Metabolic and electrolyte imbalance.  Less likely DVT given no edema and chronicity of condition.  -A1c, CMet, TSH and CBC today -Increase Gabapentin to 200 mg TID -Follow up in 2/52

## 2020-03-28 NOTE — Assessment & Plan Note (Signed)
At goal.   -Refill Amlodipine 5 daily -Refill Metoprolol 25 mg BID -Follow up 6/12

## 2020-03-28 NOTE — Assessment & Plan Note (Signed)
Unclear etiology of cough given no triggers and only occurs when coughing.  Considered GERD given documented history, although patient reports no history of GERD and no symptoms.  Given history of seasonal allergies, dry cough and no fevers seems likely allergies playing role in cough. Had previously tried Flonase without relief. -Trial Atrovent nasal spray -Continue Zyrtec daily -Follow up in 2/52, if no improvement can trial PPI

## 2020-04-02 ENCOUNTER — Other Ambulatory Visit: Payer: Self-pay | Admitting: Family Medicine

## 2020-04-02 DIAGNOSIS — M549 Dorsalgia, unspecified: Secondary | ICD-10-CM

## 2020-04-02 DIAGNOSIS — G8929 Other chronic pain: Secondary | ICD-10-CM

## 2020-04-20 ENCOUNTER — Other Ambulatory Visit: Payer: Self-pay

## 2020-04-20 ENCOUNTER — Ambulatory Visit (INDEPENDENT_AMBULATORY_CARE_PROVIDER_SITE_OTHER): Payer: Medicaid Other

## 2020-04-20 ENCOUNTER — Ambulatory Visit (INDEPENDENT_AMBULATORY_CARE_PROVIDER_SITE_OTHER): Payer: Medicaid Other | Admitting: Family Medicine

## 2020-04-20 ENCOUNTER — Other Ambulatory Visit (HOSPITAL_COMMUNITY)
Admission: RE | Admit: 2020-04-20 | Discharge: 2020-04-20 | Disposition: A | Discharge status: Home / Self Care | Payer: Medicaid Other | Source: Ambulatory Visit | Attending: Family Medicine | Admitting: Family Medicine

## 2020-04-20 ENCOUNTER — Encounter: Payer: Self-pay | Admitting: Family Medicine

## 2020-04-20 VITALS — BP 144/82 | HR 72 | Ht 65.0 in | Wt 246.0 lb

## 2020-04-20 DIAGNOSIS — Z124 Encounter for screening for malignant neoplasm of cervix: Secondary | ICD-10-CM | POA: Diagnosis present

## 2020-04-20 DIAGNOSIS — Z113 Encounter for screening for infections with a predominantly sexual mode of transmission: Secondary | ICD-10-CM | POA: Diagnosis not present

## 2020-04-20 DIAGNOSIS — R058 Other specified cough: Secondary | ICD-10-CM

## 2020-04-20 DIAGNOSIS — N938 Other specified abnormal uterine and vaginal bleeding: Secondary | ICD-10-CM | POA: Diagnosis not present

## 2020-04-20 DIAGNOSIS — Z23 Encounter for immunization: Secondary | ICD-10-CM | POA: Diagnosis present

## 2020-04-20 HISTORY — DX: Encounter for screening for malignant neoplasm of cervix: Z12.4

## 2020-04-20 LAB — POCT WET PREP (WET MOUNT)
Clue Cells Wet Prep Whiff POC: POSITIVE
Trichomonas Wet Prep HPF POC: ABSENT

## 2020-04-20 MED ORDER — FAMOTIDINE 20 MG PO TABS: 20.0000 mg | ORAL_TABLET | Freq: Two times a day (BID) | ORAL | 0 refills | Status: DC

## 2020-04-20 NOTE — Patient Instructions (Addendum)
Thank you for coming to see me today. It was a pleasure.   I will call you with the results of your PAP smear and your lab work.  Your previous labs showed that your cholesterol was a little elevated and I would you to work on lifestyle management.  I will recheck your cholesterol in 6 months time  Start Pepcid 20 mg twice a day for 2 weeks.  You can continue to use the nasal spray as needed.  Please follow-up with me in 2 weeks.  If you have any questions or concerns, please do not hesitate to call the office at [phone].  Best,   Carollee Leitz, MD

## 2020-04-20 NOTE — Progress Notes (Signed)
SUBJECTIVE:   CHIEF COMPLAINT / HPI:   [name] is a 55 y.o. female with a history of  here for an annual gynecological exam.   Gyn concerns/Preventative healthcare  Last menstrual period: No LMP recorded (lmp unknown). Patient is postmenopausal. six years ago  Regular periods: none since menopause  Heavy bleeding: none  Sexually active: yes, 1 female last 30-40 years  Contraception or hormonal therapy: none, tubal ligation 18 yrs ago  Hx of STD: Patient desire STD screening  Dyspareunia: No  Hot flashes: Intermittent  Vaginal discharge: intermittent vaginal sticky discharge  Dysuria:No  Last mammogram: 03/29/20  Breast mass or concerns: No  Last pap smear: 03/31/2016 wnl  Presents for follow up for dry cough. Seen in clinic on 03/08 and treated with Atrovent nasal spray.  Since then patient reports improvement in symptoms nasal dryness after using spray for 1 week. Still having dry cough. No belching, weight loss, pain with eating.  Denies any hematemesis or bloody stool.  Feels like something stuck in chest and cannot cough it up. Denies any weight gain, chest pain or lower extremity edema.      Chemistry      Component Value Date/Time   NA 143 03/27/2020 1110   K 4.2 03/27/2020 1110   CL 103 03/27/2020 1110   CO2 26 03/27/2020 1110   BUN 15 03/27/2020 1110   CREATININE 0.86 03/27/2020 1110   CREATININE 0.84 08/22/2015 0839      Component Value Date/Time   CALCIUM 9.9 03/27/2020 1110   ALKPHOS 67 03/27/2020 1110   AST 20 03/27/2020 1110   ALT 21 03/27/2020 1110   BILITOT <0.2 03/27/2020 1110      Lab Results  Component Value Date   WBC 4.6 09/24/2016   HGB 11.6 09/24/2016   HCT 37.6 09/24/2016   MCV 85 09/24/2016   PLT 267 09/24/2016   Lab Results  Component Value Date   TSH 1.470 03/27/2020   Lab Results  Component Value Date   HGBA1C 5.6 03/27/2020    PERTINENT  PMH / PSH: HTN Elevated BMI GERD   OBJECTIVE:   BP (!) 144/82    Pulse 72   Ht 5\' 5"  (1.651 m)   Wt 246 lb (111.6 kg)   LMP  (LMP Unknown) Comment: pt states 1.5 years ago   SpO2 98%   BMI 40.94 kg/m    General: Alert, no acute distress Pelvic Exam chaperoned by CMA Shari        External: normal female genitalia without lesions or masses        Vagina: normal without lesions or masses        Cervix: normal without lesions or masses          ASSESSMENT/PLAN:   Cervical cancer screening Last PAP 2018 wnl Tubal for contraception Pap smear: performed  Samples for Wet prep, GC/Chlamydia obtained HIV and RPR labs today   Dry cough Continues to have dry cough.  Slightly improved with Atrovent nasal spray, however patient stopped secondary to increased nasal dryness.  GERD still high on differential given history and no infectious process or risk factors for malignancy.  -Trial Pepcid 20 mg BID x 14/7 - If no improvement will consider xray and ECHO given history of Diastolic Heart Failure, although patient reports that this was years ago and no other signs of heart failure on exam. Last ECHO 2018 showed LVEF 50-55% with G1DD -Follow up appointment scheduled for April 4  Carollee Leitz, MD [place]

## 2020-04-20 NOTE — Assessment & Plan Note (Addendum)
Last PAP 2018 wnl Tubal for contraception Pap smear: performed  Samples for Wet prep, GC/Chlamydia obtained HIV and RPR labs today

## 2020-04-21 ENCOUNTER — Encounter: Payer: Self-pay | Admitting: Family Medicine

## 2020-04-21 LAB — RPR: RPR Ser Ql: NONREACTIVE

## 2020-04-21 LAB — HIV ANTIBODY (ROUTINE TESTING W REFLEX): HIV Screen 4th Generation wRfx: NONREACTIVE

## 2020-04-21 NOTE — Assessment & Plan Note (Addendum)
Continues to have dry cough.  Slightly improved with Atrovent nasal spray, however patient stopped secondary to increased nasal dryness.  GERD still high on differential given history and no infectious process or risk factors for malignancy.  -Trial Pepcid 20 mg BID x 14/7 - If no improvement will consider xray and ECHO given history of Diastolic Heart Failure, although patient reports that this was years ago and no other signs of heart failure on exam. Last ECHO 2018 showed LVEF 50-55% with G1DD -Follow up appointment scheduled for April 4

## 2020-04-23 ENCOUNTER — Encounter: Payer: Self-pay | Admitting: Family Medicine

## 2020-04-23 LAB — CYTOLOGY - PAP
Chlamydia: NEGATIVE
Comment: NEGATIVE
Comment: NEGATIVE
Comment: NORMAL
Diagnosis: NEGATIVE
High risk HPV: NEGATIVE
Neisseria Gonorrhea: NEGATIVE

## 2020-05-10 NOTE — Patient Instructions (Signed)
Thank you for coming to see me today. It was a pleasure.   I am glad your cough has improved with the Pepcid.  You can continue to take this daily.  I have ordered an ultrasound of your heart. They will call you with an appointment.  I sent a referral for allergy testing. If you do not hear back from them in a week or two please call the office so we can check on this.  I have sent referral for your colonoscopy.  They will call you with an appointment.  Please follow-up with PCP as needed.  If you have any questions or concerns, please do not hesitate to call the office at [phone].  Best,   Carollee Leitz, MD

## 2020-05-10 NOTE — Progress Notes (Signed)
    SUBJECTIVE:   CHIEF COMPLAINT / HPI: follow up cough, allergy testing referral  Presents for follow up for chronic cough. Seen in clinic on 04/01 and treated with Pepcid 20 mg daily.  Since then patient reports improvement in symptoms.   Shortness of breath Endorses SOB for about 2 years.  Has had about 10lbs weight gain during this time.  She reports that she cannot walk same distances as before, worse when climbing stairs, and has to stop to catch her breath.  Denies any chest pain, orthopnea, PND, decrease in urine output or lower extremity edema.  Allergies Requesting allergy testing. She reports small bumps on her face, seems to be intermittent. Has not changed cleansers or creams. No bumps currently.     PERTINENT  PMH / PSH:  HFpEF Seasonal allergies GERD Elevated BMI HTN  OBJECTIVE:   BP 120/68   Pulse 61   Ht 5\' 5"  (1.651 m)   Wt 240 lb 6.4 oz (109 kg)   LMP  (LMP Unknown) Comment: pt states 1.5 years ago   SpO2 98%   BMI 40.00 kg/m    General: Alert, no acute distress Cardio: Normal S1 and S2, RRR, no r/m/g Pulm: CTAB, normal work of breathing Abdomen: Bowel sounds normal. Abdomen soft and non-tender.  Extremities: No peripheral edema.  Neuro: Cranial nerves grossly intact   ASSESSMENT/PLAN:   Dry cough Suspect GERD causing symptoms given now resolved with Pepcid therapy.  -Continue Pepcid 20 mg daily -Encourage weight loss -Follow up as needed  Diastolic heart failure (HCC) Worsening SOB for 2years. Last ECHO 2012 showed G2DD, LVEF 55-60%, mild MR, mild elevated PAP 29mmHg. Euvolemic on exam. Differentials include worsening CHF, PHTN,  OHS given increase in Body habitus. -Repeat ECHO -Weight management -Consider sleep studies if no improvement in symptoms -Follow up with results -Strict return precautions provided  Encounter for allergy testing Normal exam. Referral sent for testing at patients request. Follow up as needed  Right knee  pain Bilateral knee pain. Takes Mobic daily which helps.  Discussed decreasing to every other day given her history of HTN and long term NSAID use. Patient agrees trial to every other day. -Consider repeat imaging if continues to worsen -Can consider knee injections if no improvement in symptoms -Encourage weight loss,can discuss options at next visit -Follow up as needed    Health maintenance  Colonoscopy: referral sent today   Carollee Leitz, MD [place]

## 2020-05-11 ENCOUNTER — Encounter: Payer: Self-pay | Admitting: Family Medicine

## 2020-05-11 ENCOUNTER — Other Ambulatory Visit: Payer: Self-pay

## 2020-05-11 ENCOUNTER — Ambulatory Visit (INDEPENDENT_AMBULATORY_CARE_PROVIDER_SITE_OTHER): Payer: Medicaid Other | Admitting: Family Medicine

## 2020-05-11 VITALS — BP 120/68 | HR 61 | Ht 65.0 in | Wt 240.4 lb

## 2020-05-11 DIAGNOSIS — M25561 Pain in right knee: Secondary | ICD-10-CM

## 2020-05-11 DIAGNOSIS — M549 Dorsalgia, unspecified: Secondary | ICD-10-CM

## 2020-05-11 DIAGNOSIS — Z1211 Encounter for screening for malignant neoplasm of colon: Secondary | ICD-10-CM

## 2020-05-11 DIAGNOSIS — R0600 Dyspnea, unspecified: Secondary | ICD-10-CM | POA: Diagnosis not present

## 2020-05-11 DIAGNOSIS — G8929 Other chronic pain: Secondary | ICD-10-CM | POA: Diagnosis not present

## 2020-05-11 DIAGNOSIS — R0609 Other forms of dyspnea: Secondary | ICD-10-CM

## 2020-05-11 DIAGNOSIS — Z0182 Encounter for allergy testing: Secondary | ICD-10-CM

## 2020-05-11 DIAGNOSIS — R058 Other specified cough: Secondary | ICD-10-CM

## 2020-05-11 DIAGNOSIS — I10 Essential (primary) hypertension: Secondary | ICD-10-CM

## 2020-05-11 DIAGNOSIS — Z6841 Body Mass Index (BMI) 40.0 and over, adult: Secondary | ICD-10-CM

## 2020-05-11 DIAGNOSIS — I5030 Unspecified diastolic (congestive) heart failure: Secondary | ICD-10-CM | POA: Diagnosis not present

## 2020-05-11 MED ORDER — FAMOTIDINE 20 MG PO TABS: 20.0000 mg | ORAL_TABLET | Freq: Every day | ORAL | 0 refills | Status: DC

## 2020-05-11 MED ORDER — MELOXICAM 15 MG PO TABS: 1.0000 | ORAL_TABLET | Freq: Every day | ORAL | 0 refills | Status: DC

## 2020-05-13 ENCOUNTER — Encounter: Payer: Self-pay | Admitting: Family Medicine

## 2020-05-13 DIAGNOSIS — Z0182 Encounter for allergy testing: Secondary | ICD-10-CM | POA: Insufficient documentation

## 2020-05-13 HISTORY — DX: Encounter for allergy testing: Z01.82

## 2020-05-13 NOTE — Assessment & Plan Note (Signed)
Worsening SOB for 2years. Last ECHO 2012 showed G2DD, LVEF 55-60%, mild MR, mild elevated PAP 88mmHg. Euvolemic on exam. Differentials include worsening CHF, PHTN,  OHS given increase in Body habitus. -Repeat ECHO -Weight management -Consider sleep studies if no improvement in symptoms -Follow up with results -Strict return precautions provided

## 2020-05-13 NOTE — Assessment & Plan Note (Signed)
Bilateral knee pain. Takes Mobic daily which helps.  Discussed decreasing to every other day given her history of HTN and long term NSAID use. Patient agrees trial to every other day. -Consider repeat imaging if continues to worsen -Can consider knee injections if no improvement in symptoms -Encourage weight loss,can discuss options at next visit -Follow up as needed

## 2020-05-13 NOTE — Assessment & Plan Note (Deleted)
Normotensive Continue current medications Repeat Bmet

## 2020-05-13 NOTE — Assessment & Plan Note (Signed)
Normal exam. Referral sent for testing at patients request. Follow up as needed

## 2020-05-13 NOTE — Assessment & Plan Note (Signed)
Suspect GERD causing symptoms given now resolved with Pepcid therapy.  -Continue Pepcid 20 mg daily -Encourage weight loss -Follow up as needed

## 2020-05-14 ENCOUNTER — Ambulatory Visit (HOSPITAL_COMMUNITY)
Admission: RE | Admit: 2020-05-14 | Discharge: 2020-05-14 | Disposition: A | Discharge status: Home / Self Care | Payer: Medicaid Other | Source: Ambulatory Visit | Attending: Family Medicine | Admitting: Family Medicine

## 2020-05-14 ENCOUNTER — Other Ambulatory Visit: Payer: Self-pay

## 2020-05-14 ENCOUNTER — Encounter: Payer: Self-pay | Admitting: Family Medicine

## 2020-05-14 DIAGNOSIS — R0600 Dyspnea, unspecified: Secondary | ICD-10-CM | POA: Diagnosis present

## 2020-05-14 DIAGNOSIS — E119 Type 2 diabetes mellitus without complications: Secondary | ICD-10-CM | POA: Diagnosis not present

## 2020-05-14 DIAGNOSIS — R0609 Other forms of dyspnea: Secondary | ICD-10-CM

## 2020-05-14 DIAGNOSIS — I10 Essential (primary) hypertension: Secondary | ICD-10-CM | POA: Insufficient documentation

## 2020-05-14 LAB — ECHOCARDIOGRAM COMPLETE
Area-P 1/2: 2.24 cm2
S' Lateral: 2.6 cm

## 2020-05-14 NOTE — Progress Notes (Signed)
  Echocardiogram 2D Echocardiogram has been performed.  [name] 05/14/2020, 9:00 AM

## 2020-05-15 ENCOUNTER — Other Ambulatory Visit: Payer: Self-pay | Admitting: Family Medicine

## 2020-05-15 ENCOUNTER — Encounter: Payer: Self-pay | Admitting: Family Medicine

## 2020-05-15 DIAGNOSIS — M549 Dorsalgia, unspecified: Secondary | ICD-10-CM

## 2020-05-15 DIAGNOSIS — G8929 Other chronic pain: Secondary | ICD-10-CM

## 2020-06-21 ENCOUNTER — Other Ambulatory Visit: Payer: Self-pay | Admitting: Family Medicine

## 2020-06-21 DIAGNOSIS — G8929 Other chronic pain: Secondary | ICD-10-CM

## 2020-06-22 DIAGNOSIS — Z1152 Encounter for screening for COVID-19: Secondary | ICD-10-CM | POA: Diagnosis not present

## 2020-08-20 ENCOUNTER — Ambulatory Visit: Payer: Medicaid Other | Admitting: Allergy

## 2020-09-10 ENCOUNTER — Ambulatory Visit (INDEPENDENT_AMBULATORY_CARE_PROVIDER_SITE_OTHER): Payer: Medicaid Other | Admitting: Internal Medicine

## 2020-09-10 ENCOUNTER — Other Ambulatory Visit: Payer: Self-pay

## 2020-09-10 ENCOUNTER — Encounter: Payer: Self-pay | Admitting: Internal Medicine

## 2020-09-10 VITALS — BP 142/82 | HR 71 | Temp 97.5°F | Resp 18 | Ht 65.0 in | Wt 234.6 lb

## 2020-09-10 DIAGNOSIS — R058 Other specified cough: Secondary | ICD-10-CM

## 2020-09-10 DIAGNOSIS — R053 Chronic cough: Secondary | ICD-10-CM

## 2020-09-10 DIAGNOSIS — H1013 Acute atopic conjunctivitis, bilateral: Secondary | ICD-10-CM

## 2020-09-10 DIAGNOSIS — J3089 Other allergic rhinitis: Secondary | ICD-10-CM

## 2020-09-10 DIAGNOSIS — H1010 Acute atopic conjunctivitis, unspecified eye: Secondary | ICD-10-CM

## 2020-09-10 DIAGNOSIS — J302 Other seasonal allergic rhinitis: Secondary | ICD-10-CM

## 2020-09-10 DIAGNOSIS — T781XXA Other adverse food reactions, not elsewhere classified, initial encounter: Secondary | ICD-10-CM

## 2020-09-10 MED ORDER — PANTOPRAZOLE SODIUM 40 MG PO TBEC: 40.0000 mg | DELAYED_RELEASE_TABLET | Freq: Every day | ORAL | 3 refills | Status: DC

## 2020-09-10 MED ORDER — IPRATROPIUM BROMIDE 0.03 % NA SOLN: 2.0000 | Freq: Three times a day (TID) | NASAL | 6 refills | Status: DC | PRN

## 2020-09-10 MED ORDER — OLOPATADINE HCL 0.1 % OP SOLN: OPHTHALMIC | 6 refills | Status: DC

## 2020-09-10 MED ORDER — AZELASTINE HCL 0.1 % NA SOLN: NASAL | 4 refills | Status: DC

## 2020-09-10 NOTE — Patient Instructions (Addendum)
Allergic Rhinitis - [place] and Perennial: - allergy testing today was positive for trees, molds, and mice. Intradermals were positive to dust mites only. - Start Astelin (Azelastine) 2 sprays in each nostril twice a day as needed.  You may use this as needed for nasal congestion/itchy ears if desired - Start Atrovent (Ipratropium Bromide) 1-2 sprays in each nostril up to 3 times a day as needed for runny nose/post nasal drip/drainage.   - Start Pataday (Olopatadine) 1 drop in each eye daily as needed for itchy/watery eyes. This is over the counter and likely not covered by your insurance. Please go to the following website to check for coupons: [email or web address][employer]cy  (website to sign up for pataday coupon) - Continue over the counter antihistamine.  Your options include Zyrtec (Cetirizine) '10mg'$ , Claritin (Loratadine) '10mg'$ , Allegra (Fexofenadine) '180mg'$ , or Xyzal (Levocetirinze) '5mg'$  daily as needed. - you would be a candidate for allergy shots if interested in long term control or if we are unable to control your symptoms with the above therapies.  Chronic Cough:  - suspect upper airway cough syndrome based on our discussion today - let's try the nasal sprays discussed above - let's start a medicine for reflux called protonix-take 40 mg every morning   Oral Allergy Syndrome: - Your symptoms are not consistent with true food allergies, and are more likely to be due to oral allergy syndrome. - These symptoms are not life-threatening and are because of a cross reaction between a pollen you are allergic to, and to a protein in specific foods (such as fresh fruits, vegetables, and nuts). - If you can eat these things it is fine to continue to do so.  If not, you may avoid these fresh fruits.  Heating these foods should allow them to be consumed without symptoms.   Reducing Pollen Exposure  The American Academy of Allergy, Asthma and Immunology suggests the following steps to  reduce your exposure to pollen during allergy seasons.    Do not hang sheets or clothing out to dry; pollen may collect on these items. Do not mow lawns or spend time around freshly cut grass; mowing stirs up pollen. Keep windows closed at night.  Keep car windows closed while driving. Minimize morning activities outdoors, a time when pollen counts are usually at their highest. Stay indoors as much as possible when pollen counts or humidity is high and on windy days when pollen tends to remain in the air longer. Use air conditioning when possible.  Many air conditioners have filters that trap the pollen spores. Use a HEPA room air filter to remove pollen form the indoor air you breathe.  Control of Mold Allergen   Mold and fungi can grow on a variety of surfaces provided certain temperature and moisture conditions exist.  Outdoor molds grow on plants, decaying vegetation and soil.  The major outdoor mold, Alternaria and Cladosporium, are found in very high numbers during hot and dry conditions.  Generally, a late Summer - Fall peak is seen for common outdoor fungal spores.  Rain will temporarily lower outdoor mold spore count, but counts rise rapidly when the rainy period ends.  The most important indoor molds are Aspergillus and Penicillium.  Dark, humid and poorly ventilated basements are ideal sites for mold growth.  The next most common sites of mold growth are the bathroom and the kitchen.  Outdoor (Seasonal) Mold Control Use air conditioning and keep windows closed Avoid exposure to decaying vegetation. Avoid leaf raking. Avoid grain handling. Consider  wearing a face mask if working in moldy areas.    Indoor (Perennial) Mold Control  Maintain humidity below 50%. Clean washable surfaces with 5% bleach solution. Remove sources e.g. contaminated carpets.  DUST MITE AVOIDANCE MEASURES:  There are three main measures that need and can be taken to avoid house dust mites:  Reduce  accumulation of dust in general -reduce furniture, clothing, carpeting, books, stuffed animals, especially in bedroom  Separate yourself from the dust -use pillow and mattress encasements (can be found at stores such as [employer] or online) -avoid direct exposure to air condition flow -use a HEPA filter device, especially in the bedroom; you can also use a HEPA filter vacuum cleaner -wipe dust with a moist towel instead of a dry towel or broom when cleaning  Decrease mites and/or their secretions -wash clothing and linen and stuffed animals at highest temperature possible, at least every 2 weeks -stuffed animals can also be placed in a bag and put in a freezer overnight  Despite the above measures, it is impossible to eliminate dust mites or their allergen completely from your home.  With the above measures the burden of mites in your home can be diminished, with the goal of minimizing your allergic symptoms.  Success will be reached only when implementing and using all means together.    Follow-up in 8 weeks.

## 2020-09-10 NOTE — Progress Notes (Signed)
NEW PATIENT Date of Service/Encounter:  09/10/20 Referring provider: McDiarmid, Blane Ohara, MD Primary care provider: Carollee Leitz, MD  Subjective:   [name] is a 55 y.o. female with a PMHx of hypertension, GERD, diastolic heart failure, GERD, hyperlipidemia, vitamin D and B12 deficiency, dry cough presenting today for evaluation of chronic rhinitis and conjunctivitis, mouth itching wither certain foods, and chronic dry cough. History obtained from: chart review and patient.   Chronic rhinitis and conjunctivitis: She feels she has an allergy to something in the environment.  She has year round nasal symptoms (congestion, itchy nose) and throat symptoms (post nasal drip, phlegm).   She was using flonase daily for almost one year.  Never tried atrovent, astelin nasal sprays.  + itchy eyes/watery eyes.  She has tried OTC AH which helped with sneezing.  Allergy eye drop help during Spring. Symptoms year round and seasonal.    Food reactions: Avocado, watermelon kiwi-throat itching and mouth itching. No other systemic symptoms. Never tried them heated.  Chronic Cough: She has chronic dry cough that has been going on for 3-4 years.  Intermittent.  Cough medicine does not seem to help.  Laughing seems to precipitate cough.  She coughs and hears a scratching noise in her throat.  She has tried both a controller inhaler and albuterol but it did not help.  She has never had any oral steroids for the cough.  Never evaluated by ENT.  Does report having a normal CXR and cardiac echocardiogram within the last few months.   She does have a history of reflux and was given Pepcid 20 mg, reported to resolve the cough by PCP.  However, patient reports that it did not help with the cough, and she does not feel that she has reflux.  Not currently on ACE Inhibitor.  For review-PCP note and referral discussed chronic cough improved with Pepcid 20 mg, shortness of breath being worked up for cardiac etiology present x2  years, and rash on face requesting allergy testing. Recent echocardiogram with normal findings. 1. Left ventricular ejection fraction, by estimation, is 60 to 65%. The  left ventricle has normal function. The left ventricle has no regional  wall motion abnormalities. Left ventricular diastolic parameters were  normal.   2. Right ventricular systolic function is normal. The right ventricular  size is normal. There is normal pulmonary artery systolic pressure. The  estimated right ventricular systolic pressure is [id number] mmHg.   3. The mitral valve is normal in structure. No evidence of mitral valve  regurgitation. No evidence of mitral stenosis.   4. The aortic valve is normal in structure. Aortic valve regurgitation is  not visualized. No aortic stenosis is present.   5. The inferior vena cava is normal in size with greater than 50%  respiratory variability, suggesting right atrial pressure of 3 mmHg.   Other allergy screening: Asthma: no Rhino conjunctivitis: yes Food allergy: no Medication allergy: no Eczema:no  Past Medical History: Past Medical History:  Diagnosis Date   Acute bilateral low back pain with right-sided sciatica 10/09/2014   Cough due to angiotensin-converting enzyme inhibitor 0000000   Diastolic heart failure [id number]   NEW ON ECHO 02/2010   DUB (dysfunctional uterine bleeding) 04/04/2011   03/2011 U/S:Small fibroids, one of which demonstrates mass effect upon the endometrium and could correlate with history of dysfunctional uterine bleeding. Endometrial Biopsy 07/2012 Secretory endometrium with no hyperplasia or carcinoma.  07/2012 pap smear negative for intraepithelial neoplasia. Montara on menopausal range LH, TSH,  Prolactin and Testosterone wnl.      Hemoptysis 02/2010   DUE TO ALV HGE.Marland KitchenMarland KitchenANA NEGATIVE...DEEMED DUE TO DIASTOLIC CHF   HEMOPTYSIS UNSPECIFIED 03/28/2010   Followed in Pulmonary clinic/ [facility]/ [facility] med regimen --med calendar 10/10/2010  3  episodes all related to hypertension with diastolic chf - last august 2012. CXR clear 11/21/10     History of anemia 11/29/2010   Last Hb 10.1 on 08/21 baseline around 9.    Hypertension    DX SEVERAL DECADES YRS AGO- UNTRREATED UNTIL ADMISSION 02/2010   Hyperthyroidism    Neck fullness 10/28/2017   PPD positive    NEGATIVE DURING CHILDHOOD IN [employer]..1ST + TEST 1993 DURING IMMIGRATION GRREN CARD CLEARENCE.Marland KitchenCXR NORMAL 1993 PER HX..s/p INR Rx AT [facility] [id number].Marland KitchenREPEAT + 02/2010   Medication List:  Current Outpatient Medications  Medication Sig Dispense Refill   amLODipine (NORVASC) 5 MG tablet TAKE 1 TABLET(5 MG) BY MOUTH DAILY 90 tablet 2   azelastine (ASTELIN) 0.1 % nasal spray 2 sprays in each nostril twice a day as needed.  You may use this as needed for nasal congestion/itchy ears if desired 30 mL 4   ipratropium (ATROVENT) 0.03 % nasal spray Place 2 sprays into both nostrils 3 (three) times daily as needed for rhinitis. 30 mL 6   meloxicam (MOBIC) 15 MG tablet TAKE 1 TABLET(15 MG) BY MOUTH DAILY 30 tablet 0   metoprolol tartrate (LOPRESSOR) 25 MG tablet Take 1 tablet (25 mg total) by mouth 2 (two) times daily. 180 tablet 3   Multiple Vitamins-Minerals (MULTIVITAMIN WOMEN 50+) TABS Take 1 tablet by mouth daily. 90 tablet 3   olopatadine (PATADAY) 0.1 % ophthalmic solution 1 drop in each eye daily as needed for itchy/watery eyes. 5 mL 6   pantoprazole (PROTONIX) 40 MG tablet Take 1 tablet (40 mg total) by mouth daily. 30 tablet 3   famotidine (PEPCID) 20 MG tablet Take 1 tablet (20 mg total) by mouth daily. (Patient not taking: Reported on 09/10/2020) 30 tablet 0   gabapentin (NEURONTIN) 100 MG capsule Take 2 capsules (200 mg total) by mouth 3 (three) times daily. (Patient not taking: Reported on 09/10/2020) 90 capsule 3   ipratropium (ATROVENT) 0.06 % nasal spray Place 2 sprays into both nostrils 3 (three) times daily. (Patient not taking: Reported on 09/10/2020) 15 mL 12    No current facility-administered medications for this visit.   Known Allergies:  No Known Allergies Past Surgical History: Past Surgical History:  Procedure Laterality Date   CESAREAN SECTION     TUBAL LIGATION     Family History: Family History  Problem Relation Age of Onset   Hypertension Father    Sudden death Father    Hyperlipidemia Neg Hx    Heart attack Neg Hx    Diabetes Neg Hx    Colon cancer Neg Hx    Social History: Maryse lives in a home built 20 years ago, + carpeting, + central AC, pet dog, using dust mite protection for bed but not pillows, no smoking, no smoke exposure, works as a Theatre manager for 30 years.   ROS:  All other systems negative except as noted per HPI.  Objective:  Blood pressure (!) 142/82, pulse 71, temperature (!) 97.5 F (36.4 C), temperature source Temporal, resp. rate 18, height '5\' 5"'$  (1.651 m), weight 234 lb 9.6 oz (106.4 kg), SpO2 98 %. Body mass index is 39.04 kg/m. Physical Exam:  General Appearance:  Alert, cooperative, no distress, appears stated  age  Head:  Normocephalic, without obvious abnormality, atraumatic  Eyes:  Conjunctiva clear, EOM's intact  Nose: Nares normal, normal turbinates, pink mucosa, no visible anterior polyps  Throat: Lips, tongue normal; teeth and gums normal, + cobblestoning  Neck: Supple, symmetrical  Lungs:   CTAB, no wheezing, Respirations unlabored, no coughing  Heart:  RRR, no murmur, Appears well perfused  Extremities: No edema  Skin: Skin color, texture, turgor normal, no rashes or lesions on visualized portions of skin  Neurologic: No gross deficits   Diagnostics: Spirometry:  Tracings reviewed. Her effort: It was hard to get consistent efforts and there is a question as to whether this reflects a maximal maneuver. FVC: 2.51 L, pred 2.91L FEV1: 2.09L, 90% predicted FEV1/FVC ratio: 106% Interpretation: No overt abnormalities noted given today's efforts. No obstruction, possible restriction,  but effort poor. Please see scanned spirometry results for details.  Skin Testing: Environmental allergy panel. Results discussed with patient/family.  Airborne Adult Perc - 09/10/20 0937     Time Antigen Placed [id number]    Allergen Manufacturer Lavella Hammock    Location Back    Number of Test 59    1. Control-Buffer 50% Glycerol Negative    2. Control-Histamine 1 mg/ml 2+    3. Albumin saline Negative    4. [place] Negative    5. [place] Negative    6. [place] Negative    7. [place] Blue Negative    8. Meadow Fescue Negative    9. Perennial Rye Negative    10. Sweet Vernal Negative    11. Timothy Negative    12. Cocklebur Negative    13. Burweed Marshelder Negative    14. Ragweed, short Negative    15. Ragweed, Giant Negative    16. Plantain,  English Negative    17. Lamb's Quarters Negative    18. Sheep Sorrell Negative    19. Rough Pigweed Negative    20. Marsh Elder, Rough Negative    21. Mugwort, Common 2+    22. Ash mix 3+    23. Birch mix 3+    24. Beech American Negative    25. Box, Elder 3+    26. Cedar, red Negative    27. Cottonwood, [place] Negative    28. Elm mix Negative    29. Hickory Negative    30. Maple mix Negative    31. Oak, [place] mix Negative    32. Pecan Pollen Negative    33. Pine mix Negative    34. Sycamore Eastern Negative    35. [place], Black Pollen Negative    36. Alternaria alternata Negative    37. Cladosporium Herbarum Negative    38. Aspergillus mix Negative    39. Penicillium mix 2+    40. Bipolaris sorokiniana (Helminthosporium) Negative    41. Drechslera spicifera (Curvularia) Negative    42. Mucor plumbeus Negative    43. Fusarium moniliforme 3+    44. Aureobasidium pullulans (pullulara) Negative    45. Rhizopus oryzae Negative    46. Botrytis cinera Negative    47. Epicoccum nigrum Negative    48. Phoma betae Negative    49. Candida Albicans Negative    50. Trichophyton mentagrophytes Negative    51. Mite, D Farinae  5,000 AU/ml  Negative    52. Mite, D Pteronyssinus  5,000 AU/ml Negative    53. Cat Hair 10,000 BAU/ml Negative    54.  Dog Epithelia Negative    55. Mixed Feathers Negative    56. Horse  Epithelia Negative    57. Cockroach, German Negative    58. Mouse 2+    59. Tobacco Leaf Negative             Intradermal - 09/10/20 1047     Time Antigen Placed 1047    Allergen Manufacturer Lavella Hammock    Location Arm    Number of Test 9    Control Negative    7 Grass Negative    Ragweed mix Negative    Mold 1 Negative    Mold 3 Negative    Cat Negative    Dog Negative    Cockroach Negative    Mite mix 3+             Allergy testing results were read and interpreted by myself, documented by clinical staff.  Assessment:  Seasonal and perennial allergic rhinoconjunctivitis -  History concerning for allergic rhinoconjunctivitis confirmed with skin testing and intradermal's today.  She has not responded to intranasal corticosteroids in the past so we will try intermittent nasal sprays with Astelin and Atrovent.  Chronic Dry cough  Suspect at least some upper airway component to her cough.  No concerning lung or heart findings on exam today.  Spirometry did not show obstruction.  She has historically not responded to either inhaled steroids or albuterol making asthma very unlikely.  She does have a history of diastolic heart function, but a recent normal echo.  Not taking ACE inhibitor's at this time.  We will treat postnasal drainage with Atrovent and reflux-although unclear if she is having silent reflux based on today's history.  Potentially has a neurogenic component, but will try to control upper airway inflammation at this time.  Oral Allergy Syndrome:  Her history with watermelon, kiwi, and avocado are most consistent with oral allergy syndrome which is further verified by her concurrent allergy testing today positive for mugwort and birch tree.  Discussed avoidance measures at this  time Plan/Recommendations:   Patient Instructions  Allergic Rhinitis and Conjunctivitis- Seaonsal and Perennial: - allergy testing today was positive for trees, mugwort, molds, and mice. Intradermals were positive to dust mites only. - Start Astelin (Azelastine) 2 sprays in each nostril twice a day as needed.  You may use this as needed for nasal congestion/itchy ears if desired - Start Atrovent (Ipratropium Bromide) 1-2 sprays in each nostril up to 3 times a day as needed for runny nose/post nasal drip/drainage.   - Start Pataday (Olopatadine) 1 drop in each eye daily as needed for itchy/watery eyes. This is over the counter and likely not covered by your insurance. Please go to the following website to check for coupons: RewardUpgrade.com.cy  (website to sign up for pataday coupon) - Continue over the counter antihistamine.  Your options include Zyrtec (Cetirizine) '10mg'$ , Claritin (Loratadine) '10mg'$ , Allegra (Fexofenadine) '180mg'$ , or Xyzal (Levocetirinze) '5mg'$  daily as needed. - you would be a candidate for allergy shots if interested in long term control or if we are unable to control your symptoms with the above therapies.  Chronic Cough:  - suspect upper airway cough syndrome based on our discussion today - let's try the nasal sprays discussed above - let's start a medicine for reflux called protonix-take 40 mg every morning   Oral Allergy Syndrome: - Your symptoms are not consistent with true food allergies, and are more likely to be due to oral allergy syndrome. - These symptoms are not life-threatening and are because of a cross reaction between a pollen you are allergic to,  and to a protein in specific foods (such as fresh fruits, vegetables, and nuts). - If you can eat these things it is fine to continue to do so.  If not, you may avoid these fresh fruits.  Heating these foods should allow them to be consumed without symptoms. Follow-up in 8 weeks.    This  note in its entirety was forwarded to the Provider who requested this consultation.  Thank you for your kind referral. I appreciate the opportunity to take part in Melissaann's care. Please do not hesitate to contact me with questions.  Sincerely,  Sigurd Sos, MD [facility] [place]

## 2020-10-23 ENCOUNTER — Telehealth: Payer: Self-pay | Admitting: Family Medicine

## 2020-10-23 NOTE — Telephone Encounter (Signed)
..   Medicaid Managed Care   Unsuccessful Outreach Note  10/23/2020 Name: [name] MRN: [id number] DOB: 1965-08-14  Referred by: Carollee Leitz, MD Reason for referral : High Risk Managed Medicaid (I called the patient today to get her scheduled for a phone visit with the [facility] Team. I was not able to leave a message.)   An unsuccessful telephone outreach was attempted today. The patient was referred to the case management team for assistance with care management and care coordination.   Follow Up Plan: The care management team will reach out to the patient again over the next 7-14 days.   [place]

## 2020-11-05 ENCOUNTER — Ambulatory Visit (INDEPENDENT_AMBULATORY_CARE_PROVIDER_SITE_OTHER): Payer: Medicaid Other | Admitting: Internal Medicine

## 2020-11-05 ENCOUNTER — Encounter: Payer: Self-pay | Admitting: Internal Medicine

## 2020-11-05 ENCOUNTER — Other Ambulatory Visit: Payer: Self-pay

## 2020-11-05 ENCOUNTER — Ambulatory Visit: Payer: Medicaid Other | Admitting: Internal Medicine

## 2020-11-05 VITALS — BP 142/88 | HR 68 | Temp 98.0°F | Resp 16 | Ht 65.0 in | Wt 232.0 lb

## 2020-11-05 DIAGNOSIS — J3089 Other allergic rhinitis: Secondary | ICD-10-CM | POA: Diagnosis not present

## 2020-11-05 DIAGNOSIS — H1010 Acute atopic conjunctivitis, unspecified eye: Secondary | ICD-10-CM

## 2020-11-05 DIAGNOSIS — T781XXD Other adverse food reactions, not elsewhere classified, subsequent encounter: Secondary | ICD-10-CM

## 2020-11-05 DIAGNOSIS — T7819XA Other adverse food reactions, not elsewhere classified, initial encounter: Secondary | ICD-10-CM | POA: Insufficient documentation

## 2020-11-05 DIAGNOSIS — J302 Other seasonal allergic rhinitis: Secondary | ICD-10-CM | POA: Diagnosis not present

## 2020-11-05 DIAGNOSIS — T781XXA Other adverse food reactions, not elsewhere classified, initial encounter: Secondary | ICD-10-CM | POA: Insufficient documentation

## 2020-11-05 DIAGNOSIS — R058 Other specified cough: Secondary | ICD-10-CM | POA: Diagnosis not present

## 2020-11-05 HISTORY — DX: Other adverse food reactions, not elsewhere classified, initial encounter: T78.1XXA

## 2020-11-05 HISTORY — DX: Other allergic rhinitis: H10.10

## 2020-11-05 HISTORY — DX: Acute atopic conjunctivitis, unspecified eye: J30.2

## 2020-11-05 NOTE — Patient Instructions (Addendum)
Allergic Rhinitis - Seaonsal and Perennial: - previous allergy testing was positive for trees, molds, and mice. Intradermals were positive to dust mites only. - Continue Astelin (Azelastine) 2 sprays in each nostril twice a day as needed.  You may use this as needed for nasal congestion/itchy ears if desired - Continue Atrovent (Ipratropium Bromide) 1-2 sprays in each nostril up to 3 times a day as needed for runny nose/post nasal drip/drainage.   - Continue Pataday (Olopatadine) 1 drop in each eye daily as needed for itchy/watery eyes. This is over the counter and likely not covered by your insurance. Please go to the following website to check for coupons: RewardUpgrade.com.cy  (website to sign up for pataday coupon) - Continue over the counter antihistamine.  Your options include Zyrtec (Cetirizine) 10mg , Claritin (Loratadine) 10mg , Allegra (Fexofenadine) 180mg , or Xyzal (Levocetirinze) 5mg  daily as needed. - you would be a candidate for allergy shots if interested in long term control or if we are unable to control your symptoms with the above therapies.  Chronic Cough-upper airway cough syndrome: - continue nasal sprays above as needed - Reflux medication: okay to try to use as needed, but if cough comes back, restart this medication daily.    Oral Allergy Syndrome:watermelon, kiwi. - If you can eat these things it is fine to continue to do so.  If not, you may avoid these fresh fruits.  Heating these foods or buying them canned should allow them to be consumed without symptoms. --------------------------------------------------------------------------------------------------------------------------------  Reducing Pollen Exposure  The American Academy of Allergy, Asthma and Immunology suggests the following steps to reduce your exposure to pollen during allergy seasons.    Do not hang sheets or clothing out to dry; pollen may collect on these items. Do not mow  lawns or spend time around freshly cut grass; mowing stirs up pollen. Keep windows closed at night.  Keep car windows closed while driving. Minimize morning activities outdoors, a time when pollen counts are usually at their highest. Stay indoors as much as possible when pollen counts or humidity is high and on windy days when pollen tends to remain in the air longer. Use air conditioning when possible.  Many air conditioners have filters that trap the pollen spores. Use a HEPA room air filter to remove pollen form the indoor air you breathe.  Control of Mold Allergen   Mold and fungi can grow on a variety of surfaces provided certain temperature and moisture conditions exist.  Outdoor molds grow on plants, decaying vegetation and soil.  The major outdoor mold, Alternaria and Cladosporium, are found in very high numbers during hot and dry conditions.  Generally, a late Summer - Fall peak is seen for common outdoor fungal spores.  Rain will temporarily lower outdoor mold spore count, but counts rise rapidly when the rainy period ends.  The most important indoor molds are Aspergillus and Penicillium.  Dark, humid and poorly ventilated basements are ideal sites for mold growth.  The next most common sites of mold growth are the bathroom and the kitchen.  Outdoor (Seasonal) Mold Control Use air conditioning and keep windows closed Avoid exposure to decaying vegetation. Avoid leaf raking. Avoid grain handling. Consider wearing a face mask if working in moldy areas.    Indoor (Perennial) Mold Control  Maintain humidity below 50%. Clean washable surfaces with 5% bleach solution. Remove sources e.g. contaminated carpets.  DUST MITE AVOIDANCE MEASURES:  There are three main measures that need and can be taken to avoid house  dust mites:  Reduce accumulation of dust in general -reduce furniture, clothing, carpeting, books, stuffed animals, especially in bedroom  Separate yourself from the  dust -use pillow and mattress encasements (can be found at stores such as [employer] or online) -avoid direct exposure to air condition flow -use a HEPA filter device, especially in the bedroom; you can also use a HEPA filter vacuum cleaner -wipe dust with a moist towel instead of a dry towel or broom when cleaning  Decrease mites and/or their secretions -wash clothing and linen and stuffed animals at highest temperature possible, at least every 2 weeks -stuffed animals can also be placed in a bag and put in a freezer overnight  Despite the above measures, it is impossible to eliminate dust mites or their allergen completely from your home.  With the above measures the burden of mites in your home can be diminished, with the goal of minimizing your allergic symptoms.  Success will be reached only when implementing and using all means together.    Follow-up in 1 year, sooner if needed.

## 2020-11-05 NOTE — Progress Notes (Signed)
FOLLOW UP Date of Service/Encounter:  11/05/20   Subjective:  [name] (DOB: 05/03/1965) is a 55 y.o. female with a PMHx of hypertension, GERD, diastolic heart failure, GERD, hyperlipidemia, vitamin D and B12 deficiency who returns to the [facility] on 11/05/2020 in re-evaluation of the following: chronic cough and rhinitis History obtained from: chart review and patient.  For Review, LV was on 09/10/20  with Dr.Vermon Grays seen for chronic rhinoconjunctivitis and chronic cough suspected upper airway cough syndrome, and oral allergy syndrome.  Started on Astelin, Atrovent, and continued on oral AH.  Added pataday for allergic conjunctivitis. She also reported throat and mouth itching with avocado, watermelon, and kiwi.  Today she reports for follow-up. She reports that her cough has now resolved.  She feels like the nasal sprays were the most helpful.  She does not feel that she has reflux and would like to take this medication only as needed. She is using Atrovent about once per day.  Occasionally will space out a few days if she becomes too dry.  She also uses Astelin as needed and finds it effective though dislikes the taste. The Pataday eyedrops have been very helpful for her eye symptoms. Additionally, she has continued to avoid kiwi and avocado. She did try warmed up watermelon as per my suggestion, but didn't like the taste.  However, after warming the fruit, she had no symptoms.  Discussed that this further supports diagnoses of oral allergy syndrome.  Previous Diagnostics:  SPT on 10/ trees (ash, beech, box elder), mugwort, molds (penicillium, fusarium), and mice. ID's positive to mite mix. Normal Echo on 05/14/20 Spirometry at initial visit: ratio 106%, FEV1 90%  Allergies as of 11/05/2020   No Known Allergies      Medication List        Accurate as of November 05, 2020 11:48 AM. If you have any questions, ask your nurse or doctor.          amLODipine 5 MG  tablet Commonly known as: NORVASC TAKE 1 TABLET(5 MG) BY MOUTH DAILY   azelastine 0.1 % nasal spray Commonly known as: ASTELIN 2 sprays in each nostril twice a day as needed.  You may use this as needed for nasal congestion/itchy ears if desired   famotidine 20 MG tablet Commonly known as: Pepcid Take 1 tablet (20 mg total) by mouth daily.   gabapentin 100 MG capsule Commonly known as: NEURONTIN Take 2 capsules (200 mg total) by mouth 3 (three) times daily.   ipratropium 0.06 % nasal spray Commonly known as: ATROVENT Place 2 sprays into both nostrils 3 (three) times daily.   ipratropium 0.03 % nasal spray Commonly known as: ATROVENT Place 2 sprays into both nostrils 3 (three) times daily as needed for rhinitis.   meloxicam 15 MG tablet Commonly known as: MOBIC TAKE 1 TABLET(15 MG) BY MOUTH DAILY   metoprolol tartrate 25 MG tablet Commonly known as: LOPRESSOR Take 1 tablet (25 mg total) by mouth 2 (two) times daily.   Multivitamin Women 50+ Tabs Take 1 tablet by mouth daily.   olopatadine 0.1 % ophthalmic solution Commonly known as: Pataday 1 drop in each eye daily as needed for itchy/watery eyes.   pantoprazole 40 MG tablet Commonly known as: Protonix Take 1 tablet (40 mg total) by mouth daily.       Past Medical History:  Diagnosis Date   Acute bilateral low back pain with right-sided sciatica 10/09/2014   Cough due to angiotensin-converting enzyme inhibitor  01/29/1476   Diastolic heart failure 02/9560   NEW ON ECHO 02/2010   DUB (dysfunctional uterine bleeding) 04/04/2011   03/2011 U/S:Small fibroids, one of which demonstrates mass effect upon the endometrium and could correlate with history of dysfunctional uterine bleeding. Endometrial Biopsy 07/2012 Secretory endometrium with no hyperplasia or carcinoma.  07/2012 pap smear negative for intraepithelial neoplasia. [place] on menopausal range LH, TSH, Prolactin and Testosterone wnl.      Hemoptysis 02/2010   DUE TO ALV  HGE.Marland KitchenMarland KitchenANA NEGATIVE...DEEMED DUE TO DIASTOLIC CHF   HEMOPTYSIS UNSPECIFIED 03/28/2010   Followed in Pulmonary clinic/ [facility]/ [facility] med regimen --med calendar 10/10/2010  3 episodes all related to hypertension with diastolic chf - last august 2012. CXR clear 11/21/10     History of anemia 11/29/2010   Last Hb 10.1 on 08/21 baseline around 9.    Hypertension    DX SEVERAL DECADES YRS AGO- UNTRREATED UNTIL ADMISSION 02/2010   Hyperthyroidism    Neck fullness 10/28/2017   PPD positive    NEGATIVE DURING CHILDHOOD IN [employer]..1ST + TEST 1993 DURING IMMIGRATION GRREN CARD CLEARENCE.Marland KitchenCXR NORMAL 1993 PER HX..s/p INR Rx AT [id number].Marland KitchenREPEAT + 02/2010   Past Surgical History:  Procedure Laterality Date   CESAREAN SECTION     TUBAL LIGATION     Otherwise, there have been no changes to her past medical history, surgical history, family history, or social history.  ROS: All others negative except as noted per HPI.   Objective:  BP (!) 142/88   Pulse 68   Temp 98 F (36.7 C)   Resp 16   Ht 5\' 5"  (1.651 m)   Wt 232 lb (105.2 kg)   LMP  (LMP Unknown) Comment: pt states 1.5 years ago   SpO2 98%   BMI 38.61 kg/m  Body mass index is 38.61 kg/m. Physical Exam: General Appearance:  Alert, cooperative, no distress, appears stated age  Head:  Normocephalic, without obvious abnormality, atraumatic  Eyes:  Conjunctiva clear, EOM's intact  Nose: Nares normal, mild left turbinate hypertrophy, pink mucosa  Throat: Lips, tongue normal; teeth and gums normal, normal oropharynx  Neck: Supple, symmetrical  Lungs:   Clear to auscultation bilaterally, respirations unlabored, no coughing  Heart:  Regular rate and rhythm, no murmur appears well perfused  Extremities: No edema  Skin: Skin color, texture, turgor normal, no rashes or lesions on visualized portions of skin  Neurologic: No gross deficits   Assessment:  Seasonal and perennial allergic  rhinoconjunctivitis  Pollen-food allergy, subsequent encounter  Upper airway cough syndrome  Cough reported as resolved following addition of Protonix and nasal sprays. All symptoms are controlled at this time. She will try to use Protonix as needed but has been instructed to restart daily if cough returns. Plan/Recommendations:   Patient Instructions  Allergic Rhinitis - [place]: - previous allergy testing was positive for trees, molds, and mice. Intradermals were positive to dust mites only.  Allergen avoidance as below - Continue Astelin (Azelastine) 2 sprays in each nostril twice a day as needed.  - Continue Atrovent (Ipratropium Bromide) 1-2 sprays in each nostril up to 3 times a day as needed for runny nose/post nasal drip/drainage.   - Continue Pataday (Olopatadine) 1 drop in each eye daily as needed for itchy/watery eyes.  - Continue over the counter antihistamine.  Your options include Zyrtec (Cetirizine) 10mg , Claritin (Loratadine) 10mg , Allegra (Fexofenadine) 180mg , or Xyzal (Levocetirinze) 5mg  daily as needed. - you would be a candidate for  allergy shots if interested in long term control or if we are unable to control your symptoms with the above therapies.  Chronic Cough-upper airway cough syndrome: - continue nasal sprays above as needed - Reflux medication: okay to try to use as needed, but if cough comes back, restart this medication daily.    Oral Allergy Syndrome:watermelon, kiwi, avocado - If you can eat these things it is fine to continue to do so.  If not, you may avoid these fresh fruits.  Heating these foods or buying them canned should allow them to be consumed without symptoms.  Follow-up in 1 year, sooner if needed.  Sigurd Sos, MD  [facility]

## 2020-11-09 ENCOUNTER — Other Ambulatory Visit: Payer: Self-pay | Admitting: Family Medicine

## 2020-11-09 DIAGNOSIS — M549 Dorsalgia, unspecified: Secondary | ICD-10-CM

## 2020-11-09 DIAGNOSIS — G8929 Other chronic pain: Secondary | ICD-10-CM

## 2020-11-12 ENCOUNTER — Other Ambulatory Visit: Payer: Self-pay | Admitting: Family Medicine

## 2020-11-12 DIAGNOSIS — G8929 Other chronic pain: Secondary | ICD-10-CM

## 2020-11-12 DIAGNOSIS — M549 Dorsalgia, unspecified: Secondary | ICD-10-CM

## 2020-11-13 NOTE — Telephone Encounter (Signed)
This was just refilled

## 2020-12-02 ENCOUNTER — Other Ambulatory Visit: Payer: Self-pay | Admitting: Family Medicine

## 2020-12-02 DIAGNOSIS — G8929 Other chronic pain: Secondary | ICD-10-CM

## 2020-12-02 DIAGNOSIS — M549 Dorsalgia, unspecified: Secondary | ICD-10-CM

## 2020-12-03 ENCOUNTER — Other Ambulatory Visit: Payer: Self-pay | Admitting: Family Medicine

## 2020-12-03 DIAGNOSIS — G8929 Other chronic pain: Secondary | ICD-10-CM

## 2020-12-10 ENCOUNTER — Other Ambulatory Visit: Payer: Self-pay | Admitting: *Deleted

## 2020-12-10 ENCOUNTER — Encounter: Payer: Self-pay | Admitting: *Deleted

## 2020-12-10 NOTE — Patient Outreach (Signed)
Care Coordination  12/10/2020  [name] November 21, 1965 [id number]  Successful outreach to patient to discuss Medicaid Managed - High Risk Care Management /Care Coordination program offered hby patient's [facility]. After review of health history and medications and completing SDOH screening, patient did not feel she needs services at this time.   Plan: Will close to high risk managed Medicaid disease management program.   Kelli Churn RN, CCM, [facility] Care Management Coordinator - Managed [place] High Risk [phone]

## 2020-12-10 NOTE — Patient Outreach (Signed)
Care Coordination  12/10/2020  [name] [birth year] [id number]  12/10/2020 Name: [name] MRN: [id number] DOB: 19-Nov-1965  Referred by: Carollee Leitz, MD Reason for referral : High Risk Managed Medicaid (Unsuccessful initial RNCM telephone outreach)   An unsuccessful telephone outreach was attempted today. The patient was referred to the case management team for assistance with care management and care coordination.  Patient answered 10:00 am call but states she is at work and requests this RNCM call back in 5 minutes. Returned call to patient per her request; left message requesting rerun call.   Follow Up Plan: A HIPAA compliant phone message was left for the patient providing contact information and requesting a return call. and The Managed Medicaid care management team will reach out to the patient again over the next 7 days.    Kelli Churn RN, CCM, [facility] Care Management Coordinator - Managed [place] High Risk [phone]

## 2020-12-18 DIAGNOSIS — H1045 Other chronic allergic conjunctivitis: Secondary | ICD-10-CM | POA: Diagnosis not present

## 2020-12-18 DIAGNOSIS — H40013 Open angle with borderline findings, low risk, bilateral: Secondary | ICD-10-CM | POA: Diagnosis not present

## 2020-12-23 ENCOUNTER — Other Ambulatory Visit: Payer: Self-pay | Admitting: Family Medicine

## 2020-12-23 DIAGNOSIS — I10 Essential (primary) hypertension: Secondary | ICD-10-CM

## 2021-01-03 ENCOUNTER — Encounter: Payer: Self-pay | Admitting: Family Medicine

## 2021-02-18 ENCOUNTER — Other Ambulatory Visit (HOSPITAL_BASED_OUTPATIENT_CLINIC_OR_DEPARTMENT_OTHER): Payer: Self-pay | Admitting: Family Medicine

## 2021-02-18 DIAGNOSIS — Z1231 Encounter for screening mammogram for malignant neoplasm of breast: Secondary | ICD-10-CM

## 2021-04-01 ENCOUNTER — Ambulatory Visit (HOSPITAL_BASED_OUTPATIENT_CLINIC_OR_DEPARTMENT_OTHER): Payer: Medicaid Other | Admitting: Radiology

## 2021-04-05 ENCOUNTER — Ambulatory Visit (HOSPITAL_BASED_OUTPATIENT_CLINIC_OR_DEPARTMENT_OTHER)
Admission: RE | Admit: 2021-04-05 | Discharge: 2021-04-05 | Disposition: A | Discharge status: Home / Self Care | Payer: Medicaid Other | Source: Ambulatory Visit | Attending: Family Medicine | Admitting: Family Medicine

## 2021-04-05 ENCOUNTER — Other Ambulatory Visit (HOSPITAL_BASED_OUTPATIENT_CLINIC_OR_DEPARTMENT_OTHER): Payer: Self-pay | Admitting: Family Medicine

## 2021-04-05 ENCOUNTER — Encounter (HOSPITAL_BASED_OUTPATIENT_CLINIC_OR_DEPARTMENT_OTHER): Payer: Self-pay | Admitting: Radiology

## 2021-04-05 ENCOUNTER — Other Ambulatory Visit: Payer: Self-pay

## 2021-04-05 DIAGNOSIS — Z1231 Encounter for screening mammogram for malignant neoplasm of breast: Secondary | ICD-10-CM | POA: Insufficient documentation

## 2021-04-17 ENCOUNTER — Ambulatory Visit (INDEPENDENT_AMBULATORY_CARE_PROVIDER_SITE_OTHER): Payer: Medicaid Other

## 2021-04-17 DIAGNOSIS — Z23 Encounter for immunization: Secondary | ICD-10-CM

## 2021-04-17 NOTE — Progress Notes (Signed)
Patient presents in nurse clinic for meningitis vaccine.  ? ?Patient reports she is traveling to [place] and this vaccine is suggested for travel. Confirmed with CDC.  ? ?Vaccine given in LD without complications.  ? ? ?

## 2021-04-22 NOTE — Patient Instructions (Signed)
Thank you for coming to see me today. It was a pleasure.  ? ?Recommend Shingles vaccine.  This is a 2 dose series and can be given at your local pharmacy.  Please talk to your pharmacist about this. ? ?You are due for a colonoscopy. They will call you with an appointment ? ?We will get some labs today.  If they are abnormal or we need to do something about them, I will call you.  If they are normal, I will send you a message on MyChart (if it is active) or a letter in the mail.  If you don't hear from [place] in 2 weeks, please call the office at the number below.  ? ?Please follow-up with PCP in 6 months ? ?Have a safe trip to [place]. ? ?If you have any questions or concerns, please do not hesitate to call the office at [phone]. ? ?Best,  ? ?Carollee Leitz, MD   ?

## 2021-04-23 ENCOUNTER — Ambulatory Visit (INDEPENDENT_AMBULATORY_CARE_PROVIDER_SITE_OTHER): Payer: Medicaid Other | Admitting: Family Medicine

## 2021-04-23 ENCOUNTER — Ambulatory Visit (INDEPENDENT_AMBULATORY_CARE_PROVIDER_SITE_OTHER): Payer: Medicaid Other

## 2021-04-23 ENCOUNTER — Encounter: Payer: Self-pay | Admitting: Family Medicine

## 2021-04-23 VITALS — BP 138/80 | HR 83 | Ht 65.0 in | Wt 232.0 lb

## 2021-04-23 DIAGNOSIS — M549 Dorsalgia, unspecified: Secondary | ICD-10-CM

## 2021-04-23 DIAGNOSIS — I10 Essential (primary) hypertension: Secondary | ICD-10-CM

## 2021-04-23 DIAGNOSIS — Z1211 Encounter for screening for malignant neoplasm of colon: Secondary | ICD-10-CM

## 2021-04-23 DIAGNOSIS — E785 Hyperlipidemia, unspecified: Secondary | ICD-10-CM | POA: Diagnosis not present

## 2021-04-23 DIAGNOSIS — M5432 Sciatica, left side: Secondary | ICD-10-CM

## 2021-04-23 DIAGNOSIS — Z23 Encounter for immunization: Secondary | ICD-10-CM

## 2021-04-23 DIAGNOSIS — Z0000 Encounter for general adult medical examination without abnormal findings: Secondary | ICD-10-CM | POA: Diagnosis not present

## 2021-04-23 DIAGNOSIS — E0590 Thyrotoxicosis, unspecified without thyrotoxic crisis or storm: Secondary | ICD-10-CM | POA: Diagnosis not present

## 2021-04-23 DIAGNOSIS — I5030 Unspecified diastolic (congestive) heart failure: Secondary | ICD-10-CM

## 2021-04-23 DIAGNOSIS — M5431 Sciatica, right side: Secondary | ICD-10-CM | POA: Diagnosis not present

## 2021-04-23 DIAGNOSIS — G8929 Other chronic pain: Secondary | ICD-10-CM

## 2021-04-23 DIAGNOSIS — R7309 Other abnormal glucose: Secondary | ICD-10-CM | POA: Diagnosis not present

## 2021-04-23 LAB — POCT GLYCOSYLATED HEMOGLOBIN (HGB A1C): Hemoglobin A1C: 5.8 % — AB (ref 4.0–5.6)

## 2021-04-23 MED ORDER — AMLODIPINE BESYLATE 5 MG PO TABS: ORAL_TABLET | ORAL | 2 refills | Status: DC

## 2021-04-23 MED ORDER — MELOXICAM 15 MG PO TABS: ORAL_TABLET | ORAL | 0 refills | Status: DC

## 2021-04-23 MED ORDER — METOPROLOL TARTRATE 25 MG PO TABS: 25.0000 mg | ORAL_TABLET | Freq: Two times a day (BID) | ORAL | 3 refills | Status: DC

## 2021-04-23 MED ORDER — ACETAMINOPHEN ER 650 MG PO TBCR: 1300.0000 mg | EXTENDED_RELEASE_TABLET | Freq: Three times a day (TID) | ORAL | 1 refills | Status: AC | PRN

## 2021-04-23 NOTE — Progress Notes (Signed)
? ? ?  SUBJECTIVE:  ? ?CHIEF COMPLAINT / HPI: Medication refills ? ?No acute concerns. ? ?Traveling to [place] on Monday for 2 weeks.  Has received all vaccinations required.  ? ?HTN ?Asymptomatic.  Compliant with medications.  Denies any visual disturbances, headaches, chest pain, shortness or breath or lower extremity edema. ? ?Hyperthyroid ?Asymptomatic.  Had thyroid ablation in 2013.  Had been taking PTU in past and followed by Dr Jeanann Lewandowsky. ? ?PERTINENT  PMH / PSH:  ?HTN ?Hyperthyroidiism ?HLD ? ?OBJECTIVE:  ? ?BP 138/80   Pulse 83   Ht '5\' 5"'$  (1.651 m)   Wt 232 lb (105.2 kg)   LMP  (LMP Unknown) Comment: pt states 1.5 years ago   SpO2 98%   BMI 38.61 kg/m?   ? ?General: Alert, no acute distress ?Cardio: Normal S1 and S2, RRR, no r/m/g ?Pulm: CTAB, normal work of breathing ?Abdomen: Bowel sounds normal. Abdomen soft and non-tender.  ?Extremities: No peripheral edema.  ?Neuro: Cranial nerves grossly intact ? ? ?ASSESSMENT/PLAN:  ? ?Essential hypertension ?Well controlled. Home pressures 120's 130's/80's.  Asymptomatic ?-Refill Metoprolol 25 mg BID ?-Refill Amlodipine 5 mg qhs ?-Follow up in 6 months ? ?Diastolic heart failure ([place]) ?Repeat ECHO 2022 LVEF 60-65% with normal LV function and no valvular abnormalities ?Continue to monitor ? ?Hyperthyroidism ?Asymptomatic. Not taking any medications ?Repeat TSH today ? ?Bilateral sciatica ?Continue Meloxicam 15 mg daily as needed ?Avoid use with other NSAIDs  ?Use sparingly with history of HTN ? ?Hyperlipidemia ?Lipid panel today ?If ASCVD risk > 10 % will discuss initiation of statin at next visit.  Patient is aware that this is plan. ?Follow up in 4 weeks ? ?Healthcare maintenance ?Referral sent for Colonoscopy ?Recommend Shingles Vaccine ?A1c today ?Follow up in 1 year for continued HCM ?  ? ? ?Carollee Leitz, MD ?[place]  ?

## 2021-04-24 LAB — LIPID PANEL
Chol/HDL Ratio: 4.8 ratio — ABNORMAL HIGH (ref 0.0–4.4)
Cholesterol, Total: 174 mg/dL (ref 100–199)
HDL: 36 mg/dL — ABNORMAL LOW (ref >39)
LDL Chol Calc (NIH): 112 mg/dL — ABNORMAL HIGH (ref 0–99)
Triglycerides: 147 mg/dL (ref 0–149)
VLDL Cholesterol Cal: 26 mg/dL (ref 5–40)

## 2021-04-24 LAB — TSH: TSH: 1.24 u[IU]/mL (ref 0.450–4.500)

## 2021-04-24 LAB — BASIC METABOLIC PANEL
BUN/Creatinine Ratio: 22 (ref 9–23)
BUN: 20 mg/dL (ref 6–24)
CO2: 23 mmol/L (ref 20–29)
Calcium: 9.7 mg/dL (ref 8.7–10.2)
Chloride: 105 mmol/L (ref 96–106)
Creatinine, Ser: 0.91 mg/dL (ref 0.57–1.00)
Glucose: 125 mg/dL — ABNORMAL HIGH (ref 70–99)
Potassium: 4.8 mmol/L (ref 3.5–5.2)
Sodium: 141 mmol/L (ref 134–144)
eGFR: 74 mL/min/{1.73_m2} (ref >59)

## 2021-04-25 ENCOUNTER — Ambulatory Visit: Payer: Medicaid Other

## 2021-04-29 ENCOUNTER — Encounter: Payer: Self-pay | Admitting: Family Medicine

## 2021-04-29 DIAGNOSIS — Z0000 Encounter for general adult medical examination without abnormal findings: Secondary | ICD-10-CM | POA: Insufficient documentation

## 2021-04-29 HISTORY — DX: Encounter for general adult medical examination without abnormal findings: Z00.00

## 2021-04-29 NOTE — Assessment & Plan Note (Signed)
Lipid panel today ?If ASCVD risk > 10 % will discuss initiation of statin at next visit.  Patient is aware that this is plan. ?Follow up in 4 weeks ?

## 2021-04-29 NOTE — Assessment & Plan Note (Addendum)
Asymptomatic. Not taking any medications ?Repeat TSH today ?

## 2021-04-29 NOTE — Assessment & Plan Note (Signed)
Well controlled. Home pressures 120's 130's/80's.  Asymptomatic ?-Refill Metoprolol 25 mg BID ?-Refill Amlodipine 5 mg qhs ?-Follow up in 6 months ?

## 2021-04-29 NOTE — Assessment & Plan Note (Addendum)
Repeat ECHO 2022 LVEF 60-65% with normal LV function and no valvular abnormalities ?Continue to monitor ?

## 2021-04-29 NOTE — Progress Notes (Signed)
Letter sent with lab results and requesting patient to schedule appointment when she returns from [place] to discuss. ? ?Kayla Leitz, MD ?[facility]   ?

## 2021-04-29 NOTE — Assessment & Plan Note (Signed)
Referral sent for Colonoscopy ?Recommend Shingles Vaccine ?A1c today ?Follow up in 1 year for continued HCM ?

## 2021-04-29 NOTE — Assessment & Plan Note (Signed)
Continue Meloxicam 15 mg daily as needed ?Avoid use with other NSAIDs  ?Use sparingly with history of HTN ?

## 2021-05-21 ENCOUNTER — Ambulatory Visit: Payer: Medicaid Other | Admitting: Family Medicine

## 2021-05-22 ENCOUNTER — Ambulatory Visit (INDEPENDENT_AMBULATORY_CARE_PROVIDER_SITE_OTHER): Payer: Medicaid Other | Admitting: Family Medicine

## 2021-05-22 VITALS — BP 124/72 | HR 78 | Ht 65.0 in | Wt 232.6 lb

## 2021-05-22 DIAGNOSIS — I10 Essential (primary) hypertension: Secondary | ICD-10-CM

## 2021-05-22 DIAGNOSIS — J309 Allergic rhinitis, unspecified: Secondary | ICD-10-CM

## 2021-05-22 DIAGNOSIS — J029 Acute pharyngitis, unspecified: Secondary | ICD-10-CM

## 2021-05-22 DIAGNOSIS — E785 Hyperlipidemia, unspecified: Secondary | ICD-10-CM | POA: Diagnosis not present

## 2021-05-22 LAB — POCT RAPID STREP A (OFFICE): Rapid Strep A Screen: NEGATIVE

## 2021-05-22 NOTE — Progress Notes (Signed)
? ? ?  SUBJECTIVE:  ? ?CHIEF COMPLAINT / HPI: sore throat and requesting antibiotics ? ?Reports 3-4 days of sore throat.  Fever last night 100.2.  Endorses runny nose, cough with yellow phlegm.  Has tried home remedies without relief.  Stopped Astelin nasal spray 1 week ago.  Recently returned from overseas trip to [place].  Had all vaccines prior to trip.  Denies any headaches, ear pain, sinus pressure, difficulty swallowing solids or liquids.   ? ?PERTINENT  PMH / PSH:  ?HTN ? ? ?OBJECTIVE:  ? ?BP 124/72   Pulse 78   Ht '5\' 5"'$  (1.651 m)   Wt 232 lb 9.6 oz (105.5 kg)   LMP  (LMP Unknown) Comment: pt states 1.5 years ago   SpO2 97%   BMI 38.71 kg/m?   ? ?General: Alert, no acute distress ?HEENT: TM's visible bilaterally, no bulging, erythema or drainage, no frontal or maxillary tenderness, mild erythema noted to pharyngeal wall, tonsils without edema or exudate, right submandibular adenopathy and tenderness ?Cardio: Normal S1 and S2, RRR, no r/m/g ?Pulm: CTAB, normal work of breathing ?Abdomen: Bowel sounds normal. Abdomen soft and non-tender.  ?Extremities: No peripheral edema.  ? ? ? ?ASSESSMENT/PLAN:  ? ?Pharyngitis ?Likely viral etiology.  Discussed with patient that antibiotics is not indicated at this time given Rapid strep negative ?Cepacol lozenges as needed ?Chloroeseptic spray as needed ?Continue symptomatic management ?Follow up with PCP if no improvement in symptoms ? ? ? ?Hyperlipidemia ?ASCVD risk 8.6%.  Discussed with patient initiating statin or continuing lifestyle management ?Prefer not to start statin at this time and would agree given would not have much benefit with risk <10% ?Encouraged to continue healthy lifestyle choices ?Follow up as needed ? ?Allergic rhinitis ?Continue Astelin spray ? ?Essential hypertension ?Initially elevated, repeat at goal ?Will continue current medications ?Repeat BMet at next visit ?Follow up in 5 months ?  ? ? ?Carollee Leitz, MD ?[place]   ?

## 2021-05-22 NOTE — Patient Instructions (Signed)
Thank you for coming to see me today. It was a pleasure. Today we talked about:  ? ?Recommend Shingles vaccine.  This is a 2 dose series and can be given at your local pharmacy.  Please talk to your pharmacist about this.  ? ?Strep testing is negative ? ?Please follow-up with PCP as needed ? ?If you have any questions or concerns, please do not hesitate to call the office at [phone]. ? ?Best,  ? ?Carollee Leitz, MD   ? ?Pharyngitis ? ?Pharyngitis is a sore throat (pharynx). This is when there is redness, pain, and swelling in your throat. Most of the time, this condition gets better on its own. In some cases, you may need medicine. ?What are the causes? ?An infection from a virus. ?An infection from bacteria. ?Allergies. ?What increases the risk? ?Being 88-31 years old. ?Being in crowded environments. These include: ?Daycares. ?Schools. ?Dormitories. ?Living in a place with cold temperatures outside. ?Having a weakened disease-fighting (immune) system. ?What are the signs or symptoms? ?Symptoms may vary depending on the cause. Common symptoms include: ?Sore throat. ?Tiredness (fatigue). ?Low-grade fever. ?Stuffy nose. ?Cough. ?Headache. ?Other symptoms may include: ?Glands in the neck (lymph nodes) that are swollen. ?Skin rashes. ?Film on the throat or tonsils. This can be caused by an infection from bacteria. ?Vomiting. ?Red, itchy eyes. ?Loss of appetite. ?Joint pain and muscle aches. ?Tonsils that are temporarily bigger than usual (enlarged). ?How is this treated? ?Many times, treatment is not needed. This condition usually gets better in 3-4 days without treatment. ?If the infection is caused by a bacteria, you may be need to take antibiotics. ?Follow these instructions at home: ?Medicines ?Take over-the-counter and prescription medicines only as told by your doctor. ?If you were prescribed an antibiotic medicine, take it as told by your doctor. Do not stop taking the antibiotic even if you start to feel  better. ?Use throat lozenges or sprays to soothe your throat as told by your doctor. ?Children can get pharyngitis. Do not give your child aspirin. ?Managing pain ?To help with pain, try: ?Sipping warm liquids, such as: ?Broth. ?Herbal tea. ?Warm water. ?Eating or drinking cold or frozen liquids, such as frozen ice pops. ?Rinsing your mouth (gargle) with a salt water mixture 3-4 times a day or as needed. ?To make salt water, dissolve ?-1 tsp (3-6 g) of salt in 1 cup (237 mL) of warm water. ?Do not swallow this mixture. ?Sucking on hard candy or throat lozenges. ?Putting a cool-mist humidifier in your bedroom at night to moisten the air. ?Sitting in the bathroom with the door closed for 5-10 minutes while you run hot water in the shower. ? ?General instructions ? ?Do not smoke or use any products that contain nicotine or tobacco. If you need help quitting, ask your doctor. ?Rest as told by your doctor. ?Drink enough fluid to keep your pee (urine) pale yellow. ?How is this prevented? ?Wash your hands often for at least 20 seconds with soap and water. If soap and water are not available, use hand sanitizer. ?Do not touch your eyes, nose, or mouth with unwashed hands. Wash hands after touching these areas. ?Do not share cups or eating utensils. ?Avoid close contact with people who are sick. ?Contact a doctor if: ?You have large, tender lumps in your neck. ?You have a rash. ?You cough up green, yellow-brown, or bloody spit. ?Get help right away if: ?You have a stiff neck. ?You drool or cannot swallow liquids. ?You cannot drink or  take medicines without vomiting. ?You have very bad pain that does not go away with medicine. ?You have problems breathing, and it is not from a stuffy nose. ?You have new pain and swelling in your knees, ankles, wrists, or elbows. ?These symptoms may be an emergency. Get help right away. Call your local emergency services (911 in the [place] ?Do not wait to see if the symptoms will go away. ?Do  not drive yourself to the hospital. ?Summary ?Pharyngitis is a sore throat (pharynx). This is when there is redness, pain, and swelling in your throat. ?Most of the time, pharyngitis gets better on its own. Sometimes, you may need medicine. ?If you were prescribed an antibiotic medicine, take it as told by your doctor. Do not stop taking the antibiotic even if you start to feel better. ?This information is not intended to replace advice given to you by your health care provider. Make sure you discuss any questions you have with your health care provider. ?Document Revised: 04/04/2020 Document Reviewed: 04/04/2020 ?Elsevier Patient Education ? [place]. ? ?

## 2021-05-23 ENCOUNTER — Ambulatory Visit: Payer: Medicaid Other | Admitting: Family Medicine

## 2021-05-25 ENCOUNTER — Encounter: Payer: Self-pay | Admitting: Family Medicine

## 2021-05-25 DIAGNOSIS — J029 Acute pharyngitis, unspecified: Secondary | ICD-10-CM

## 2021-05-25 HISTORY — DX: Acute pharyngitis, unspecified: J02.9

## 2021-05-25 MED ORDER — CEPACOL SORE THROAT 5.4 MG MT LOZG
LOZENGE | OROMUCOSAL | 0 refills | Status: DC
Start: 2021-05-25 — End: 2021-06-05

## 2021-05-25 NOTE — Assessment & Plan Note (Signed)
Continue Astelin spray ?

## 2021-05-25 NOTE — Assessment & Plan Note (Signed)
Initially elevated, repeat at goal ?Will continue current medications ?Repeat BMet at next visit ?Follow up in 5 months ?

## 2021-05-25 NOTE — Assessment & Plan Note (Signed)
Likely viral etiology.  Discussed with patient that antibiotics is not indicated at this time given Rapid strep negative ?Cepacol lozenges as needed ?Chloroeseptic spray as needed ?Continue symptomatic management ?Follow up with PCP if no improvement in symptoms ? ? ?

## 2021-05-25 NOTE — Assessment & Plan Note (Signed)
ASCVD risk 8.6%.  Discussed with patient initiating statin or continuing lifestyle management ?Prefer not to start statin at this time and would agree given would not have much benefit with risk <10% ?Encouraged to continue healthy lifestyle choices ?Follow up as needed ?

## 2021-06-03 ENCOUNTER — Other Ambulatory Visit: Payer: Self-pay | Admitting: Family Medicine

## 2021-06-03 DIAGNOSIS — G8929 Other chronic pain: Secondary | ICD-10-CM

## 2021-06-04 ENCOUNTER — Ambulatory Visit (HOSPITAL_BASED_OUTPATIENT_CLINIC_OR_DEPARTMENT_OTHER): Payer: Medicaid Other | Admitting: Advanced Practice Midwife

## 2021-06-04 ENCOUNTER — Telehealth (HOSPITAL_BASED_OUTPATIENT_CLINIC_OR_DEPARTMENT_OTHER): Payer: Self-pay | Admitting: Advanced Practice Midwife

## 2021-06-04 NOTE — Telephone Encounter (Signed)
Called patient and left a detail message to please call the office back about today's missed appointment. ?

## 2021-06-04 NOTE — Progress Notes (Deleted)
   Subjective:     [name] is a 56 y.o. female here at [facility] *** for a routine exam.  Current complaints: ***.  Personal health questionnaire reviewed: {yes/no:9010}.  Do you have a primary care provider? *** Do you feel safe at home? ***  [place] Office Visit from 05/22/2021 in [facility]  PHQ-2 Total Score 0       Health Maintenance Due  Topic Date Due   Zoster Vaccines- Shingrix (1 of 2) Never done   COLONOSCOPY (Pts 45-61yr Insurance coverage will need to be confirmed)  11/25/2019   TETANUS/TDAP  01/01/2021     Risk factors for chronic health problems: Smoking: Alchohol/how much: Pt BMI: There is no height or weight on file to calculate BMI.   Gynecologic History No LMP recorded (lmp unknown). Patient is postmenopausal. Contraception: {method:5051} Last Pap: ***. Results were: {norm/abn:16337} Last mammogram: ***. Results were: {norm/abn:16337}  Obstetric History OB History  Gravida Para Term Preterm AB Living  '7       2 5  '$ SAB IAB Ectopic Multiple Live Births  1 1          # Outcome Date GA Lbr Len/2nd Weight Sex Delivery Anes PTL Lv  7 Gravida           6 Gravida           5 Gravida           4 Gravida           3 Gravida           2 IAB           1 SAB              {Common ambulatory SmartLinks:19316}  Review of Systems {ros; complete:30496}    Objective:   LMP  (LMP Unknown) Comment: pt states 1.5 years ago  VS reviewed, nursing note reviewed,  Constitutional: well developed, well nourished, no distress HEENT: normocephalic CV: normal rate Pulm/chest wall: normal effort Breast Exam:  ***Deferred with low risks and shared decision making, discussed recommendation to start mammogram between 40-50 yo/ exam performed: right breast normal without mass, skin or nipple changes or axillary nodes, left breast normal without mass, skin or nipple changes or axillary nodes Abdomen: soft Neuro: alert and oriented x 3 Skin: warm,  dry Psych: affect normal Pelvic exam: ***Deferred/ Performed: Cervix pink, visually closed, without lesion, scant white creamy discharge, vaginal walls and external genitalia normal Bimanual exam: Cervix 0/long/high, firm, anterior, neg CMT, uterus nontender, nonenlarged, adnexa without tenderness, enlargement, or mass       Assessment/Plan:   There are no diagnoses linked to this encounter.     No follow-ups on file.   LFatima Blank CNM 8:17 AM

## 2021-06-05 ENCOUNTER — Other Ambulatory Visit: Payer: Self-pay

## 2021-06-05 ENCOUNTER — Ambulatory Visit (INDEPENDENT_AMBULATORY_CARE_PROVIDER_SITE_OTHER): Payer: Medicaid Other | Admitting: Family Medicine

## 2021-06-05 DIAGNOSIS — I10 Essential (primary) hypertension: Secondary | ICD-10-CM

## 2021-06-05 DIAGNOSIS — M549 Dorsalgia, unspecified: Secondary | ICD-10-CM

## 2021-06-05 DIAGNOSIS — M5440 Lumbago with sciatica, unspecified side: Secondary | ICD-10-CM

## 2021-06-05 HISTORY — DX: Dorsalgia, unspecified: M54.9

## 2021-06-05 MED ORDER — BACLOFEN 10 MG PO TABS: 10.0000 mg | ORAL_TABLET | Freq: Three times a day (TID) | ORAL | 0 refills | Status: DC

## 2021-06-05 NOTE — Assessment & Plan Note (Signed)
On amlodipine and Lopressor, reports good compliance.  Blood pressure elevated today but improved on recheck.  Patient has home blood pressure cuff, she will take measurements and return if blood pressures are persistently elevated (>140/90). States home BP generally around 530 systolic. ?
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
## 2021-06-05 NOTE — Patient Instructions (Signed)
It was wonderful to see you today. ? ?Please bring ALL of your medications with you to every visit.  ? ?Today we talked about: ? ?-I am prescribing a short course of muscle relaxers. Use as needed.  ?-Continue moving!  ?-Use heating pads to your back multiple times a day ?-Come back if not improving or if worsening. ? ?Thank you for choosing [facility].  ? ?Please call [phone] with any questions about today's appointment. ? ?Please be sure to schedule follow up at the front  desk before you leave today.  ? ?Sharion Settler, DO ?PGY-2 Family Medicine   ?
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
## 2021-06-25 ENCOUNTER — Encounter: Payer: Self-pay | Admitting: *Deleted

## 2021-06-25 NOTE — Patient Instructions (Signed)
Thank you for coming to see me today. It was a pleasure.   ***  Please follow-up with *** in ***  If you have any questions or concerns, please do not hesitate to call the office at [phone].  Best,   Carollee Leitz, MD
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
## 2021-06-26 ENCOUNTER — Encounter: Payer: Self-pay | Admitting: Family Medicine

## 2021-06-26 ENCOUNTER — Ambulatory Visit (INDEPENDENT_AMBULATORY_CARE_PROVIDER_SITE_OTHER): Payer: Medicaid Other | Admitting: Family Medicine

## 2021-06-26 VITALS — BP 166/76 | HR 71 | Ht 65.0 in | Wt 233.4 lb

## 2021-06-26 DIAGNOSIS — S46912A Strain of unspecified muscle, fascia and tendon at shoulder and upper arm level, left arm, initial encounter: Secondary | ICD-10-CM

## 2021-06-26 DIAGNOSIS — I10 Essential (primary) hypertension: Secondary | ICD-10-CM

## 2021-06-26 DIAGNOSIS — G8929 Other chronic pain: Secondary | ICD-10-CM

## 2021-06-26 MED ORDER — ACETAMINOPHEN ER 650 MG PO TBCR
1300.0000 mg | EXTENDED_RELEASE_TABLET | Freq: Three times a day (TID) | ORAL | 0 refills | Status: AC
Start: 2021-06-26 — End: 2021-07-10

## 2021-06-26 MED ORDER — DICLOFENAC SODIUM 1 % EX GEL: 2.0000 g | Freq: Four times a day (QID) | CUTANEOUS | 1 refills | Status: DC

## 2021-07-03 ENCOUNTER — Encounter: Payer: Self-pay | Admitting: Family Medicine

## 2021-07-03 DIAGNOSIS — S46912S Strain of unspecified muscle, fascia and tendon at shoulder and upper arm level, left arm, sequela: Secondary | ICD-10-CM | POA: Insufficient documentation

## 2021-07-03 DIAGNOSIS — S46912A Strain of unspecified muscle, fascia and tendon at shoulder and upper arm level, left arm, initial encounter: Secondary | ICD-10-CM | POA: Insufficient documentation

## 2021-07-03 NOTE — Assessment & Plan Note (Addendum)
Blood pressure not at goal.  Patient has not taken her blood pressure medications today. Reports compliancy with antihypertensives. -Continue amlodipine 5 mg nightly -Continue metoprolol 25 mg twice daily -Monitor blood pressure at home and if remains above 140/90 we will need to increase medications -Follow-up with PCP in 1 week, plan to increase amlodipine -Strict return precautions provided.
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
## 2021-07-04 ENCOUNTER — Encounter: Payer: Self-pay | Admitting: Family Medicine

## 2021-07-04 ENCOUNTER — Ambulatory Visit (INDEPENDENT_AMBULATORY_CARE_PROVIDER_SITE_OTHER): Payer: Medicaid Other | Admitting: Family Medicine

## 2021-07-04 VITALS — BP 146/80 | HR 71 | Wt 230.0 lb

## 2021-07-04 DIAGNOSIS — S46912S Strain of unspecified muscle, fascia and tendon at shoulder and upper arm level, left arm, sequela: Secondary | ICD-10-CM | POA: Diagnosis not present

## 2021-07-04 DIAGNOSIS — S46912A Strain of unspecified muscle, fascia and tendon at shoulder and upper arm level, left arm, initial encounter: Secondary | ICD-10-CM

## 2021-07-04 DIAGNOSIS — M25512 Pain in left shoulder: Secondary | ICD-10-CM | POA: Diagnosis not present

## 2021-07-04 DIAGNOSIS — I10 Essential (primary) hypertension: Secondary | ICD-10-CM | POA: Diagnosis not present

## 2021-07-04 DIAGNOSIS — S46912D Strain of unspecified muscle, fascia and tendon at shoulder and upper arm level, left arm, subsequent encounter: Secondary | ICD-10-CM | POA: Diagnosis not present

## 2021-07-04 MED ORDER — CYCLOBENZAPRINE HCL 5 MG PO TABS: 5.0000 mg | ORAL_TABLET | Freq: Every day | ORAL | 0 refills | Status: DC

## 2021-07-04 MED ORDER — AMLODIPINE BESYLATE 10 MG PO TABS: ORAL_TABLET | ORAL | 1 refills | Status: DC

## 2021-07-04 NOTE — Progress Notes (Signed)
Lab visit scheduled as office visit, unable to sign encounter due to security clearance in epic. Will forward to MD to sign chart.  

## 2021-07-04 NOTE — Patient Instructions (Addendum)
Thank you for coming to see me today. It was a pleasure.   For your blood pressure Increase amlodipine to 10 mg at night.  If you have a lot of your 5 mg tablets you can take two 5 mg tablets at night.  When you have run out of the 5 mg tablets you can pick up your prescription for the 10 mg tablets.  Once you pick up the prescription for 10 mg tablets please take 1 tablet at night.  For your left shoulder pain Continue Tylenol 1300 mg 3 times a day Continue diclofenac gel 4 times a day Stop baclofen Start Flexeril 5 mg at night x5 nights Continue meloxicam 15 mg daily for now.  Recommend that once pain has subsided we start decreasing meloxicam as this can affect your blood pressure. Will send referral to sports medicine.  Please let us know if you do not receive a call for an appointment within 2 weeks.  You are due for a colonoscopy.  Please use the form that we have given you to schedule this at your convenience.  Call [facility] at [phone]  Recommend Shingles vaccine.  This is a 2 dose series and can be given at your local pharmacy.  Please talk to your pharmacist about this.   Please follow-up with PCP in 2 weeks  If you have any questions or concerns, please do not hesitate to call the office at [phone].  Best,   Carollee Leitz, MD    Shoulder Range of Motion Exercises Shoulder range of motion (ROM) exercises are done to keep the shoulder moving freely or to increase movement. They are often recommended for people who have shoulder pain or stiffness or who are recovering from a shoulder surgery. Phase 1 exercise When you are able, do this exercise 1-2 times per day for 30-60 seconds in each direction, or as directed by your health care provider. Pendulum exercise     To do this exercise while sitting: Sit in a chair or at the edge of your bed with your feet flat on the floor. Let your affected arm hang down in front of you over the edge of the bed or chair. Relax  your shoulder, arm, and hand. Rock your body so your arm gently swings in small circles. You can also use your unaffected arm to start the motion. Repeat changing the direction of the circles, swinging your arm left and right, and swinging your arm forward and back. To do this exercise while standing: Stand next to a sturdy chair or table, and hold on to it with your hand on your unaffected side. Bend forward at the waist. Bend your knees slightly. Relax your shoulder, arm, and hand. While keeping your shoulder relaxed, use body motion to swing your arm in small circles. Repeat changing the direction of the circles, swinging your arm left and right, and swinging your arm forward and back. Between exercises, stand up tall and take a short break to relax your lower back.  Phase 2 exercises Do these exercises 1-2 times per day or as told by your health care provider. Hold each stretch for 30 seconds, and repeat 3 times. Do the exercises with one or both arms as instructed by your health care provider. For these exercises, sit at a table with your hand and arm supported by the table. A chair that slides easily or has wheels can be helpful. External rotation  Turn your chair so that your affected side is  nearest to the table. Place your forearm on the table to your side. Bend your elbow in about a 90-degree angle (right angle) at the elbow, and place your hand palm-down on the table. Your elbow should be about 6 inches (15 cm) away from your side. Keeping your arm on the table, lean your body forward. Abduction  Turn your chair so that your affected side is nearest to the table. Place your forearm and hand on the table so that your thumb points toward the ceiling and your arm is straight out to your side. Slide your hand out to the side and away from you, using your unaffected arm to do the work. To increase the stretch, you can slide your chair away from the table. Flexion: forward  stretch  Sit facing the table. Place your hand and elbow on the table in front of you. Slide your hand forward and away from you, using your unaffected arm to do the work. To increase the stretch, you can slide your chair backward. Phase 3 exercises Do these exercises 1-2 times per day or as told by your health care provider. Hold each stretch for 30 seconds, and repeat 3 times. Do the exercises with one or both arms as instructed by your health care provider. You will need a cane, a piece of PVC pipe, or a sturdy wooden dowel for the wand exercises. Cross-body stretch: posterior capsule stretch  Lift your arm straight out in front of you. Bend your arm in a 90-degree angle (right angle) at the elbow so your forearm moves across your body. Use your other arm to gently pull the elbow across your body, toward your other shoulder. Wall climbs  Stand with your affected arm extended out to the side with your hand resting on a door frame. Slide your hand slowly up the door frame. To increase the stretch, step through the door frame. Keep your body upright and do not lean. Flexion     To do this exercise while standing: Hold the wand with both of your hands, palms down. Using the other arm to help, lift your arms up and over your head, if able. Push upward with your other arm to gently increase the stretch. To do this exercise while lying down: Lie on your back with your elbows resting on the floor and the wand in both your hands. Your hands will be palm down, or pointing toward your feet. Lift your hands toward the ceiling, using your unaffected arm to help if needed. Bring your arms overhead as able, using your unaffected arm to help if needed.  Internal rotation  Stand while holding the wand behind you with both hands. Your unaffected arm should be extended above your head with the arm of the affected side extended behind you at the level of your waist. The wand should be pointing  straight up and down as you hold it. Slowly pull the wand up behind your back by straightening the elbow of your unaffected arm and bending the elbow of your affected arm. External rotation  Lie on your back with your affected upper arm supported on a small pillow or rolled towel. When you first do this exercise, keep your upper arm close to your body. Over time, bring your arm up to a 90-degree angle (right angle) out to the side. Hold the wand across your stomach and with both hands palm up. Your elbow on your affected side should be bent at a 90-degree angle. Use your  unaffected side to help push your forearm away from you and toward the floor. Keep your elbow on your affected side bent at a 90-degree angle. Contact a health care provider if you have: New or increasing pain. New numbness, tingling, weakness, or discoloration in your arm or hand. This information is not intended to replace advice given to you by your health care provider. Make sure you discuss any questions you have with your health care provider. Document Revised: 01/01/2021 Document Reviewed: 09/21/2020 Elsevier Patient Education  [place].

## 2021-07-04 NOTE — Patient Instructions (Signed)
It was wonderful to see you today.  Please bring ALL of your medications with you to every visit.   Today we talked about:  **   Thank you for choosing [facility].   Please call [phone] with any questions about today's appointment.  Please be sure to schedule follow up at the front  desk before you leave today.   Dorris Singh, MD  Family Medicine
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
## 2021-07-07 ENCOUNTER — Encounter: Payer: Self-pay | Admitting: Family Medicine

## 2021-07-07 NOTE — Assessment & Plan Note (Signed)
Pain elicited on palpation suspect deep muscle strain of anterior left shoulder. Continue Tylenol 1300 mg 3 times daily Continue meloxicam 15 mg daily Continue diclofenac gel 4 times daily as needed Flexeril 5 mg nightly x5 days. We will refer to sports medicine for evaluation and ultrasound left shoulder rule out muscle tear. Strict return precautions provided Follow-up with PCP as needed

## 2021-07-07 NOTE — Assessment & Plan Note (Signed)
Not at goal.  Reports compliancy with medication -Increase amlodipine from 5 mg to 10 mg nightly -Continue metoprolol 25 mg twice daily -Follow-up with PCP in 2 weeks -Strict return precautions provided.

## 2021-07-10 ENCOUNTER — Ambulatory Visit: Payer: Medicaid Other | Admitting: Sports Medicine

## 2021-07-10 ENCOUNTER — Ambulatory Visit: Payer: Self-pay

## 2021-07-10 VITALS — BP 169/82 | Ht 65.0 in | Wt 230.0 lb

## 2021-07-10 DIAGNOSIS — S161XXA Strain of muscle, fascia and tendon at neck level, initial encounter: Secondary | ICD-10-CM | POA: Diagnosis not present

## 2021-07-10 DIAGNOSIS — M25512 Pain in left shoulder: Secondary | ICD-10-CM | POA: Diagnosis not present

## 2021-07-10 DIAGNOSIS — M25412 Effusion, left shoulder: Secondary | ICD-10-CM | POA: Diagnosis not present

## 2021-07-10 MED ORDER — METHYLPREDNISOLONE ACETATE 40 MG/ML IJ SUSP
40.0000 mg | Freq: Once | INTRAMUSCULAR | Status: AC
Start: 2021-07-10 — End: 2021-07-10
  Administered 2021-07-10: 40 mg via INTRA_ARTICULAR

## 2021-07-10 NOTE — Patient Instructions (Signed)
It was great to meet you today, thank you for letting me participate in your care!  Today, we discussed your clavicle/anterior shoulder pain.  I believe this is a combination of your [facility] joint as well as the muscles (SCM) that extend down from your neck.   Things to do for this: -We gave you a shot into the [facility] joint today.  You may use ice and/or Tylenol/meloxicam for any postinjection pain.  This should start kicking in here in a few days. -For the neck and clavicle area, you are to use heat as well as topical IcyHot to massage this area. -Perform your neck exercises at least once a day for the next 4 weeks  You will follow-up here in about 1 month if your pain is not improved.  If you have any further questions, please give the clinic a call [phone].  [name], [place]
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
## 2021-07-30 ENCOUNTER — Ambulatory Visit (HOSPITAL_BASED_OUTPATIENT_CLINIC_OR_DEPARTMENT_OTHER): Payer: Medicaid Other | Admitting: Advanced Practice Midwife

## 2021-08-29 ENCOUNTER — Other Ambulatory Visit: Payer: Self-pay

## 2021-08-29 DIAGNOSIS — I10 Essential (primary) hypertension: Secondary | ICD-10-CM

## 2021-08-30 MED ORDER — METOPROLOL TARTRATE 25 MG PO TABS: 25.0000 mg | ORAL_TABLET | Freq: Two times a day (BID) | ORAL | 3 refills | Status: DC

## 2021-09-24 ENCOUNTER — Ambulatory Visit (INDEPENDENT_AMBULATORY_CARE_PROVIDER_SITE_OTHER): Payer: Medicaid Other | Admitting: Advanced Practice Midwife

## 2021-09-24 ENCOUNTER — Encounter (HOSPITAL_BASED_OUTPATIENT_CLINIC_OR_DEPARTMENT_OTHER): Payer: Self-pay | Admitting: Advanced Practice Midwife

## 2021-09-24 VITALS — BP 146/73 | HR 63 | Ht 65.0 in | Wt 231.8 lb

## 2021-09-24 DIAGNOSIS — I10 Essential (primary) hypertension: Secondary | ICD-10-CM | POA: Diagnosis not present

## 2021-09-24 DIAGNOSIS — Z1231 Encounter for screening mammogram for malignant neoplasm of breast: Secondary | ICD-10-CM | POA: Diagnosis not present

## 2021-09-24 DIAGNOSIS — Z01419 Encounter for gynecological examination (general) (routine) without abnormal findings: Secondary | ICD-10-CM | POA: Diagnosis not present
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
## 2021-11-13 ENCOUNTER — Ambulatory Visit (HOSPITAL_COMMUNITY)
Admission: RE | Admit: 2021-11-13 | Discharge: 2021-11-13 | Disposition: A | Discharge status: Home / Self Care | Payer: Medicaid Other | Source: Ambulatory Visit | Attending: Family Medicine | Admitting: Family Medicine

## 2021-11-13 ENCOUNTER — Ambulatory Visit: Payer: Medicaid Other

## 2021-11-13 ENCOUNTER — Ambulatory Visit (INDEPENDENT_AMBULATORY_CARE_PROVIDER_SITE_OTHER): Payer: Medicaid Other | Admitting: Family Medicine

## 2021-11-13 VITALS — BP 136/74 | HR 94 | Wt 235.0 lb

## 2021-11-13 DIAGNOSIS — M4316 Spondylolisthesis, lumbar region: Secondary | ICD-10-CM | POA: Diagnosis not present

## 2021-11-13 DIAGNOSIS — M545 Low back pain, unspecified: Secondary | ICD-10-CM | POA: Diagnosis not present

## 2021-11-13 DIAGNOSIS — G8929 Other chronic pain: Secondary | ICD-10-CM | POA: Insufficient documentation

## 2021-11-13 MED ORDER — CYCLOBENZAPRINE HCL 5 MG PO TABS: 5.0000 mg | ORAL_TABLET | Freq: Three times a day (TID) | ORAL | 1 refills | Status: DC | PRN
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
## 2021-11-13 NOTE — Patient Instructions (Signed)
It was wonderful to see you today.  Please bring ALL of your medications with you to every visit.   Today we talked about:  For your back, I think you have a muscle strain.  I have prescribed a medication called Flexeril 5 mg that you can take up to 3 times a day as needed for pain.  Please try it for the first time when you are at home in case it makes you sleepy.  You can still take your meloxicam with this.  We have also placed a referral to physical therapy and I have ordered an x-ray you can go across the street to [facility] entrance A to get this done.  Thank you for choosing [facility].   Please call [phone] with any questions about today's appointment.  Please be sure to schedule follow up in 2 months at the front  desk before you leave today.   Please arrive at least 15 minutes prior to your scheduled appointments.   If you had blood work today, I will send you a MyChart message or a letter if results are normal. Otherwise, I will give you a call.   If you had a referral placed, they will call you to set up an appointment. Please give [place] a call if you don't hear back in the next 2 weeks.   If you need additional refills before your next appointment, please call your pharmacy first.   Yehuda Savannah, MD  Family Medicine

## 2021-11-19 ENCOUNTER — Other Ambulatory Visit: Payer: Self-pay

## 2021-11-19 ENCOUNTER — Ambulatory Visit: Payer: Medicaid Other | Attending: Family Medicine

## 2021-11-19 DIAGNOSIS — M6281 Muscle weakness (generalized): Secondary | ICD-10-CM | POA: Insufficient documentation

## 2021-11-19 DIAGNOSIS — G8929 Other chronic pain: Secondary | ICD-10-CM | POA: Insufficient documentation

## 2021-11-19 DIAGNOSIS — M545 Low back pain, unspecified: Secondary | ICD-10-CM | POA: Diagnosis not present

## 2021-11-19 DIAGNOSIS — M5459 Other low back pain: Secondary | ICD-10-CM | POA: Insufficient documentation
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
## 2021-11-21 ENCOUNTER — Ambulatory Visit: Payer: Medicaid Other

## 2021-11-25 ENCOUNTER — Encounter: Payer: Self-pay | Admitting: Gastroenterology

## 2021-11-27 ENCOUNTER — Ambulatory Visit: Payer: Medicaid Other | Attending: Family Medicine

## 2021-11-27 DIAGNOSIS — M5459 Other low back pain: Secondary | ICD-10-CM | POA: Diagnosis not present

## 2021-11-27 DIAGNOSIS — M6281 Muscle weakness (generalized): Secondary | ICD-10-CM | POA: Insufficient documentation
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
## 2021-12-02 ENCOUNTER — Ambulatory Visit: Payer: Medicaid Other

## 2021-12-02 DIAGNOSIS — M5459 Other low back pain: Secondary | ICD-10-CM | POA: Diagnosis not present

## 2021-12-02 DIAGNOSIS — M6281 Muscle weakness (generalized): Secondary | ICD-10-CM

## 2021-12-02 NOTE — Therapy (Signed)
OUTPATIENT PHYSICAL THERAPY TREATMENT NOTE   Patient Name: [name] MRN: [id number] DOB:Mar 06, 1965, 56 y.o., female Today's Date: 12/02/2021  PCP: Holley Bouche, MD  REFERRING PROVIDER: Lenoria Chime, MD   END OF SESSION:   PT End of Session - 12/02/21 0914     Visit Number 3    Number of Visits 17    Date for PT Re-Evaluation 01/14/22    Authorization Type M[employer]    PT Start Time 0915    PT Stop Time 0955    PT Time Calculation (min) 40 min    Activity Tolerance Patient tolerated treatment well    Behavior During Therapy [facility] for tasks assessed/performed              Past Medical History:  Diagnosis Date   Acute bilateral low back pain with right-sided sciatica 10/09/2014   Allergic rhinitis 05/11/2017   Back pain 06/05/2021   Bilateral leg cramps 03/28/2020   Cervical cancer screening 04/20/2020   Cough due to angiotensin-converting enzyme inhibitor 09/26/2950   Diastolic heart failure 08/4130   NEW ON ECHO 04/4008   Diastolic heart failure ([place]) 11/29/2010   ECHO performed 09/09/10 Left ventricle: The cavity size was normal. There was mild     concentric hypertrophy. Systolic function was vigorous. The     estimated ejection fraction was in the range of 65% to 70%. Wall     motion was normal; there were no regional wall motion     abnormalities. Features are consistent with a pseudonormal left     ventricular filling pattern, with concomitant abnormal   DUB (dysfunctional uterine bleeding) 04/04/2011   03/2011 U/S:Small fibroids, one of which demonstrates mass effect upon the endometrium and could correlate with history of dysfunctional uterine bleeding. Endometrial Biopsy 07/2012 Secretory endometrium with no hyperplasia or carcinoma.  07/2012 pap smear negative for intraepithelial neoplasia. [place] on menopausal range LH, TSH, Prolactin and Testosterone wnl.      Encounter for allergy testing 05/13/2020   Healthcare maintenance 04/29/2021   Hemoptysis 02/2010   DUE TO ALV  HGE.Marland KitchenMarland KitchenANA NEGATIVE...DEEMED DUE TO DIASTOLIC CHF   HEMOPTYSIS UNSPECIFIED 03/28/2010   Followed in Pulmonary clinic/ [facility]/ Ramasw[facility] Complex med regimen --med calendar 10/10/2010  3 episodes all related to hypertension with diastolic chf - last august 2012. CXR clear 11/21/10     History of anemia 11/29/2010   Last Hb 10.1 on 08/21 baseline around 9.    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   PPD positive    NEGATIVE DURING CHILDHOOD IN [employer]..1ST + TEST 1993 DURING IMMIGRATION GRREN CARD CLEARENCE.Marland KitchenCXR NORMAL 1993 PER HX..s/p INR Rx AT [facility] [id number].Marland KitchenREPEAT + 02/2010   Right knee pain 05/06/2011   Seasonal and perennial allergic rhinoconjunctivitis 11/05/2020   Upper airway cough syndrome 03/28/2020   Vitamin B12 deficiency 09/25/2016   Vitamin D deficiency 09/24/2016   Past Surgical History:  Procedure Laterality Date   CESAREAN SECTION     TUBAL LIGATION     Patient Active Problem List   Diagnosis Date Noted   Chronic right-sided low back pain without sciatica 11/13/2021   Muscle strain of shoulder region, left, sequela 07/03/2021   Hyperlipidemia 09/24/2016   GERD (gastroesophageal reflux disease) 10/09/2014   Hyperthyroidism 11/29/2010   Essential hypertension 03/28/2010    REFERRING DIAG: M54.50,G89.29 (ICD-10-CM) - Chronic right-sided  low back pain without sciatica   THERAPY DIAG:  Other low back pain  Muscle weakness (generalized)  Rationale for Evaluation and Treatment Rehabilitation  PERTINENT HISTORY: HTN, DHF   PRECAUTIONS: None   SUBJECTIVE:                                                                                                                                                                                      SUBJECTIVE STATEMENT:  Pt presents to PT with no current reports of pain.  Has been compliant with HEP with no adverse effect. Is ready to begin PT at this time.    PAIN:  Are you having pain?  Yes: NPRS scale: 6/10 Best: 6/10 Worst: 10/10 Pain location: R side lower back, R LE Pain description: sharp Aggravating factors: unsure Relieving factors: None   OBJECTIVE: (objective measures completed at initial evaluation unless otherwise dated)  PATIENT SURVEYS:  ODI: 48% disability   COGNITION: Overall cognitive status: Within functional limits for tasks assessed                          SENSATION: WFL   POSTURE: increased lumbar lordosis and larger body habitus   PALPATION: TTP to lower lumbar paraspinals, irritation around R illiac crest   LUMBAR ROM:    AROM eval  Flexion Decreased with pain  Extension Decreased with pain  Right lateral flexion    Left lateral flexion    Right rotation Decreased with pain  Left rotation Decreased with pain   (Blank rows = not tested)   LOWER EXTREMITY MMT:     MMT Right eval Left eval  Hip flexion 3+/5 4/5  Hip extension      Hip abduction 4/5 4/5  Hip adduction 4/5 4/5  Hip internal rotation      Hip external rotation      Knee flexion      Knee extension      Ankle dorsiflexion      Ankle plantarflexion      Ankle inversion      Ankle eversion       (Blank rows = not tested)   LUMBAR SPECIAL TESTS:  Straight leg raise test: Positive and Slump test: Positive   FUNCTIONAL TESTS:  30 Second Sit to Stand: 7 reps - with UE   GAIT: Distance walked: 56f Assistive device utilized: None Level of assistance: Complete Independence Comments: antalgic gait R   [facility] Adult PT Treatment:  DATE: 12/02/2021 Therapeutic Exercise: NuStep lvl 5 UE/LE x 4 min while taking subjective STS 2x10 - holding 5# DB Bridge (small range) 2x15 Supine PPT x 10 - 5" hold Supine clamshell 2x15 Black TB Supine SLR 2x15 Supine 90/90 2x15" hold LTR x 10 Supine figure  4 stretch 2x30" Seated physioball rollout fwd/lat x 15 each Standing hip abd/ext 2x10 25#  [facility] Adult PT Treatment:                                                DATE: 11/27/2021 Therapeutic Exercise: NuStep lvl 5 UE/LE x 3 min while taking subjective Supine PPT x 10 - 5" hold Bridge (small range) 2x10 LTR x 10 Supine clamshell 2x15 BTB Supine SLR 2x10 Seated ball squeeze 2x10 Seated physioball rollout fwd/lat x 10 each STS 2x10   [facility] Adult PT Treatment:                                                DATE: 11/19/2021 Therapeutic Exercise: Supine PPT x 5 - 5" hold Bridge (small range) x 5 LTR x 5 Seated sciatic nerve glide x 5 R     PATIENT EDUCATION:  Education details: HEP update Person educated: Patient Education method: Explanation, Demonstration, and Handouts Education comprehension: verbalized understanding and returned demonstration   HOME EXERCISE PROGRAM: Access Code: [email or web address] URL: [email or web address] Date: 11/19/2021 Prepared by: Octavio Manns   Exercises - Supine Posterior Pelvic Tilt  - 2 x daily - 7 x weekly - 2 sets - 10 reps - 3 sec hold - Beginner Bridge  - 2 x daily - 7 x weekly - 3 sets - 10 reps - Supine Lower Trunk Rotation  - 2 x daily - 7 x weekly - 2 sets - 10 reps - Seated Sciatic Tensioner  - 2 x daily - 7 x weekly - 3 sets - 10 reps   ASSESSMENT:   CLINICAL IMPRESSION: Pt was able to complete all prescribed exercises with no adverse effect or increase in pain. Therapy focused on improving core and proximal hip strength in order to decrease pain and improve comfort. She is progressing as expected thus far, will continue per POC as prescribed.    OBJECTIVE IMPAIRMENTS: decreased activity tolerance, decreased endurance, decreased mobility, difficulty walking, decreased ROM, decreased strength, and pain.    ACTIVITY LIMITATIONS: standing, squatting, stairs, transfers, and locomotion level   PARTICIPATION LIMITATIONS: meal prep,  cleaning, driving, shopping, community activity, and yard work   PERSONAL FACTORS: 1-2 comorbidities: HTN, DHF  are also affecting patient's functional outcome.      GOALS: Goals reviewed with patient? No   SHORT TERM GOALS: Target date: 12/10/2021   Pt will be compliant and knowledgeable with initial HEP for improved comfort and carryover Baseline: initial HEP given  Goal status: INITIAL   2.  Pt will self report lower back pain no greater than 6/10 for improved comfort and functional ability Baseline: 10/10 at worst Goal status: INITIAL    LONG TERM GOALS: Target date: 01/14/2022   Pt will self report lower back pain no greater than 3/10 for improved comfort and functional ability Baseline: 10/10 at worst Goal status: INITIAL    2.  Pt will be decrease  ODI disability score to no greater than 30% as proxy for functional improvement Baseline: 48% disability  Goal status: INITIAL   3.  Pt will increase 30 Second Sit to Stand rep count to no less than 10 reps for improved balance, strength, and functional mobility Baseline: 7 reps  Goal status: INITIAL    4.  Pt will improve all LE MMT to no less than 5/5 for imrpoved functional mobility and decreased pain Baseline: see chart Goal status: INITIAL   PLAN:   PT FREQUENCY: 2x/week   PT DURATION: 8 weeks   PLANNED INTERVENTIONS: Therapeutic exercises, Therapeutic activity, Neuromuscular re-education, Balance training, Gait training, Patient/Family education, Self Care, Joint mobilization, Dry Needling, Electrical stimulation, Cryotherapy, Moist heat, Vasopneumatic device, Manual therapy, and Re-evaluation.   PLAN FOR NEXT SESSION: assess HEP response, progress core and proximal hip strength   Ward Chatters, PT 12/02/2021, 9:57 AM

## 2021-12-04 NOTE — Therapy (Signed)
OUTPATIENT PHYSICAL THERAPY TREATMENT NOTE   Patient Name: [name] MRN: [id number] DOB:24-Oct-1965, 56 y.o., female Today's Date: 12/05/2021  PCP: Holley Bouche, MD  REFERRING PROVIDER: Lenoria Chime, MD   END OF SESSION:   PT End of Session - 12/05/21 1006     Visit Number 4    Number of Visits 17    Date for PT Re-Evaluation 01/14/22    Authorization Type M[employer]    PT Start Time 1002    PT Stop Time 1042    PT Time Calculation (min) 40 min    Activity Tolerance Patient tolerated treatment well    Behavior During Therapy [facility] for tasks assessed/performed              Past Medical History:  Diagnosis Date   Acute bilateral low back pain with right-sided sciatica 10/09/2014   Allergic rhinitis 05/11/2017   Back pain 06/05/2021   Bilateral leg cramps 03/28/2020   Cervical cancer screening 04/20/2020   Cough due to angiotensin-converting enzyme inhibitor 08/25/7617   Diastolic heart failure 05/930   NEW ON ECHO 06/7122   Diastolic heart failure ([place]) 11/29/2010   ECHO performed 09/09/10 Left ventricle: The cavity size was normal. There was mild     concentric hypertrophy. Systolic function was vigorous. The     estimated ejection fraction was in the range of 65% to 70%. Wall     motion was normal; there were no regional wall motion     abnormalities. Features are consistent with a pseudonormal left     ventricular filling pattern, with concomitant abnormal   DUB (dysfunctional uterine bleeding) 04/04/2011   03/2011 U/S:Small fibroids, one of which demonstrates mass effect upon the endometrium and could correlate with history of dysfunctional uterine bleeding. Endometrial Biopsy 07/2012 Secretory endometrium with no hyperplasia or carcinoma.  07/2012 pap smear negative for intraepithelial neoplasia. [place] on menopausal range LH, TSH, Prolactin and Testosterone wnl.      Encounter for allergy testing 05/13/2020   Healthcare maintenance 04/29/2021   Hemoptysis 02/2010   DUE TO ALV  HGE.Marland KitchenMarland KitchenANA NEGATIVE...DEEMED DUE TO DIASTOLIC CHF   HEMOPTYSIS UNSPECIFIED 03/28/2010   Followed in Pulmonary clinic/ [facility]/ [facility]asw[facility] Complex med regimen --med calendar 10/10/2010  3 episodes all related to hypertension with diastolic chf - last august 2012. CXR clear 11/21/10     History of anemia 11/29/2010   Last Hb 10.1 on 08/21 baseline around 9.    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   PPD positive    NEGATIVE DURING CHILDHOOD IN [employer]..1ST + TEST 1993 DURING IMMIGRATION GRREN CARD CLEARENCE.Marland KitchenCXR NORMAL 1993 PER HX..s/p INR Rx AT [facility] [id number].Marland KitchenREPEAT + 02/2010   Right knee pain 05/06/2011   Seasonal and perennial allergic rhinoconjunctivitis 11/05/2020   Upper airway cough syndrome 03/28/2020   Vitamin B12 deficiency 09/25/2016   Vitamin D deficiency 09/24/2016   Past Surgical History:  Procedure Laterality Date   CESAREAN SECTION     TUBAL LIGATION     Patient Active Problem List   Diagnosis Date Noted   Chronic right-sided low back pain without sciatica 11/13/2021   Muscle strain of shoulder region, left, sequela 07/03/2021   Hyperlipidemia 09/24/2016   GERD (gastroesophageal reflux disease) 10/09/2014   Hyperthyroidism 11/29/2010   Essential hypertension 03/28/2010    REFERRING DIAG: M54.50,G89.29 (ICD-10-CM) - Chronic right-sided  low back pain without sciatica   THERAPY DIAG:  Other low back pain  Muscle weakness (generalized)  Rationale for Evaluation and Treatment Rehabilitation  PERTINENT HISTORY: HTN, DHF   PRECAUTIONS: None   SUBJECTIVE:                                                                                                                                                                                      SUBJECTIVE STATEMENT:  Pt presents to PT with reports of continued decrease  in LBP. Has been compliant with HEP with no adverse effect. She is ready to begin PT at this time.   PAIN:  Are you having pain?  Yes: NPRS scale: 6/10 Best: 6/10 Worst: 10/10 Pain location: R side lower back, R LE Pain description: sharp Aggravating factors: unsure Relieving factors: None   OBJECTIVE: (objective measures completed at initial evaluation unless otherwise dated)  PATIENT SURVEYS:  ODI: 48% disability   COGNITION: Overall cognitive status: Within functional limits for tasks assessed                          SENSATION: WFL   POSTURE: increased lumbar lordosis and larger body habitus   PALPATION: TTP to lower lumbar paraspinals, irritation around R illiac crest   LUMBAR ROM:    AROM eval  Flexion Decreased with pain  Extension Decreased with pain  Right lateral flexion    Left lateral flexion    Right rotation Decreased with pain  Left rotation Decreased with pain   (Blank rows = not tested)   LOWER EXTREMITY MMT:     MMT Right eval Left eval  Hip flexion 3+/5 4/5  Hip extension      Hip abduction 4/5 4/5  Hip adduction 4/5 4/5  Hip internal rotation      Hip external rotation      Knee flexion      Knee extension      Ankle dorsiflexion      Ankle plantarflexion      Ankle inversion      Ankle eversion       (Blank rows = not tested)   LUMBAR SPECIAL TESTS:  Straight leg raise test: Positive and Slump test: Positive   FUNCTIONAL TESTS:  30 Second Sit to Stand: 7 reps - with UE   GAIT: Distance walked: 44f Assistive device utilized: None Level of assistance: Complete Independence Comments: antalgic gait R   TREATMENT: [facility] Adult PT Treatment:  DATE: 12/05/2021 Therapeutic Exercise: NuStep lvl 5 UE/LE x 4 min while taking subjective STS 2x10 - holding 7# DB Bridge (small range) 2x15 LTR x 10 Supine SLR 2x15 Supine PPT x 10 - 5" hold S/L clamshell 2x15 GTB Supine 90/90 2x15"  hold Supine figure 4 stretch 2x30" Seated physioball rollout fwd/lat x 15 each Standing hip abd/ext 2x10 25#  [facility] Adult PT Treatment:                                                DATE: 12/02/2021 Therapeutic Exercise: NuStep lvl 5 UE/LE x 4 min while taking subjective STS 2x10 - holding 5# DB Bridge (small range) 2x15 Supine PPT x 10 - 5" hold Supine clamshell 2x15 Black TB Supine SLR 2x15 Supine 90/90 2x15" hold LTR x 10 Supine figure 4 stretch 2x30" Seated physioball rollout fwd/lat x 15 each Standing hip abd/ext 2x10 25#  [facility] Adult PT Treatment:                                                DATE: 11/27/2021 Therapeutic Exercise: NuStep lvl 5 UE/LE x 3 min while taking subjective Supine PPT x 10 - 5" hold Bridge (small range) 2x10 LTR x 10 Supine clamshell 2x15 BTB Supine SLR 2x10 Seated ball squeeze 2x10 Seated physioball rollout fwd/lat x 10 each STS 2x10   [facility] Adult PT Treatment:                                                DATE: 11/19/2021 Therapeutic Exercise: Supine PPT x 5 - 5" hold Bridge (small range) x 5 LTR x 5 Seated sciatic nerve glide x 5 R     PATIENT EDUCATION:  Education details: HEP update Person educated: Patient Education method: Explanation, Demonstration, and Handouts Education comprehension: verbalized understanding and returned demonstration   HOME EXERCISE PROGRAM: Access Code: [email or web address] URL: [email or web address] Date: 11/19/2021 Prepared by: Octavio Manns   Exercises - Supine Posterior Pelvic Tilt  - 2 x daily - 7 x weekly - 2 sets - 10 reps - 3 sec hold - Beginner Bridge  - 2 x daily - 7 x weekly - 3 sets - 10 reps - Supine Lower Trunk Rotation  - 2 x daily - 7 x weekly - 2 sets - 10 reps - Seated Sciatic Tensioner  - 2 x daily - 7 x weekly - 3 sets - 10 reps   ASSESSMENT:   CLINICAL IMPRESSION: Pt was able to complete all prescribed exercises with no adverse effect or increase in pain. Therapy focused on  improving core and proximal hip strength in order to decrease pain and improve comfort. She is progressing as expected thus far, will continue per POC as prescribed.     OBJECTIVE IMPAIRMENTS: decreased activity tolerance, decreased endurance, decreased mobility, difficulty walking, decreased ROM, decreased strength, and pain.    ACTIVITY LIMITATIONS: standing, squatting, stairs, transfers, and locomotion level   PARTICIPATION LIMITATIONS: meal prep, cleaning, driving, shopping, community activity, and yard work   PERSONAL FACTORS: 1-2 comorbidities: HTN, DHF  are also affecting patient's functional outcome.  GOALS: Goals reviewed with patient? No   SHORT TERM GOALS: Target date: 12/10/2021   Pt will be compliant and knowledgeable with initial HEP for improved comfort and carryover Baseline: initial HEP given  Goal status: INITIAL   2.  Pt will self report lower back pain no greater than 6/10 for improved comfort and functional ability Baseline: 10/10 at worst Goal status: INITIAL    LONG TERM GOALS: Target date: 01/14/2022   Pt will self report lower back pain no greater than 3/10 for improved comfort and functional ability Baseline: 10/10 at worst Goal status: INITIAL    2.  Pt will be decrease ODI disability score to no greater than 30% as proxy for functional improvement Baseline: 48% disability  Goal status: INITIAL   3.  Pt will increase 30 Second Sit to Stand rep count to no less than 10 reps for improved balance, strength, and functional mobility Baseline: 7 reps  Goal status: INITIAL    4.  Pt will improve all LE MMT to no less than 5/5 for imrpoved functional mobility and decreased pain Baseline: see chart Goal status: INITIAL   PLAN:   PT FREQUENCY: 2x/week   PT DURATION: 8 weeks   PLANNED INTERVENTIONS: Therapeutic exercises, Therapeutic activity, Neuromuscular re-education, Balance training, Gait training, Patient/Family education, Self Care, Joint  mobilization, Dry Needling, Electrical stimulation, Cryotherapy, Moist heat, Vasopneumatic device, Manual therapy, and Re-evaluation.   PLAN FOR NEXT SESSION: assess HEP response, progress core and proximal hip strength   Ward Chatters, PT 12/05/2021, 10:52 AM

## 2021-12-05 ENCOUNTER — Ambulatory Visit: Payer: Medicaid Other

## 2021-12-05 DIAGNOSIS — M5459 Other low back pain: Secondary | ICD-10-CM

## 2021-12-05 DIAGNOSIS — M6281 Muscle weakness (generalized): Secondary | ICD-10-CM

## 2021-12-09 ENCOUNTER — Ambulatory Visit: Payer: Medicaid Other

## 2021-12-09 DIAGNOSIS — M5459 Other low back pain: Secondary | ICD-10-CM

## 2021-12-09 DIAGNOSIS — M6281 Muscle weakness (generalized): Secondary | ICD-10-CM

## 2021-12-09 NOTE — Therapy (Signed)
OUTPATIENT PHYSICAL THERAPY TREATMENT NOTE   Patient Name: [name] MRN: [id number] DOB:March 09, 1965, 56 y.o., female Today's Date: 12/09/2021  PCP: Holley Bouche, MD  REFERRING PROVIDER: Lenoria Chime, MD   END OF SESSION:   PT End of Session - 12/09/21 0830     Visit Number 5    Number of Visits 17    Date for PT Re-Evaluation 01/14/22    Authorization Type M[employer]    PT Start Time 0830    PT Stop Time 0908    PT Time Calculation (min) 38 min    Activity Tolerance Patient tolerated treatment well    Behavior During Therapy [facility] for tasks assessed/performed               Past Medical History:  Diagnosis Date   Acute bilateral low back pain with right-sided sciatica 10/09/2014   Allergic rhinitis 05/11/2017   Back pain 06/05/2021   Bilateral leg cramps 03/28/2020   Cervical cancer screening 04/20/2020   Cough due to angiotensin-converting enzyme inhibitor 02/25/4268   Diastolic heart failure 06/2374   NEW ON ECHO 02/8313   Diastolic heart failure ([place]) 11/29/2010   ECHO performed 09/09/10 Left ventricle: The cavity size was normal. There was mild     concentric hypertrophy. Systolic function was vigorous. The     estimated ejection fraction was in the range of 65% to 70%. Wall     motion was normal; there were no regional wall motion     abnormalities. Features are consistent with a pseudonormal left     ventricular filling pattern, with concomitant abnormal   DUB (dysfunctional uterine bleeding) 04/04/2011   03/2011 U/S:Small fibroids, one of which demonstrates mass effect upon the endometrium and could correlate with history of dysfunctional uterine bleeding. Endometrial Biopsy 07/2012 Secretory endometrium with no hyperplasia or carcinoma.  07/2012 pap smear negative for intraepithelial neoplasia. [place] on menopausal range LH, TSH, Prolactin and Testosterone wnl.      Encounter for allergy testing 05/13/2020   Healthcare maintenance 04/29/2021   Hemoptysis 02/2010   DUE TO ALV  HGE.Marland KitchenMarland KitchenANA NEGATIVE...DEEMED DUE TO DIASTOLIC CHF   HEMOPTYSIS UNSPECIFIED 03/28/2010   Followed in Pulmonary clinic/ [facility]/ [facility] med regimen --med calendar 10/10/2010  3 episodes all related to hypertension with diastolic chf - last august 2012. CXR clear 11/21/10     History of anemia 11/29/2010   Last Hb 10.1 on 08/21 baseline around 9.    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   PPD positive    NEGATIVE DURING CHILDHOOD IN [employer]..1ST + TEST 1993 DURING IMMIGRATION GRREN CARD CLEARENCE.Marland KitchenCXR NORMAL 1993 PER HX..s/p INR Rx AT [id number].Marland KitchenREPEAT + 02/2010   Right knee pain 05/06/2011   Seasonal and perennial allergic rhinoconjunctivitis 11/05/2020   Upper airway cough syndrome 03/28/2020   Vitamin B12 deficiency 09/25/2016   Vitamin D deficiency 09/24/2016   Past Surgical History:  Procedure Laterality Date   CESAREAN SECTION     TUBAL LIGATION     Patient Active Problem List   Diagnosis Date Noted   Chronic right-sided low back pain without sciatica 11/13/2021   Muscle strain of shoulder region, left, sequela 07/03/2021   Hyperlipidemia 09/24/2016   GERD (gastroesophageal reflux disease) 10/09/2014   Hyperthyroidism 11/29/2010   Essential hypertension 03/28/2010    REFERRING DIAG: M54.50,G89.29 (ICD-10-CM) - Chronic  right-sided low back pain without sciatica   THERAPY DIAG:  Other low back pain  Muscle weakness (generalized)  Rationale for Evaluation and Treatment Rehabilitation  PERTINENT HISTORY: HTN, DHF   PRECAUTIONS: None   SUBJECTIVE:                                                                                                                                                                                      SUBJECTIVE STATEMENT:  Pt presents to PT with reports of past few days of  muscle soreness but decreased back pain today. Has been compliant with HEP with no adverse effect. Pt is ready to begin PT treatment at this time.    PAIN:  Are you having pain?  Yes: NPRS scale: 6/10 Best: 6/10 Worst: 10/10 Pain location: R side lower back, R LE Pain description: sharp Aggravating factors: unsure Relieving factors: None   OBJECTIVE: (objective measures completed at initial evaluation unless otherwise dated)  PATIENT SURVEYS:  ODI: 48% disability   COGNITION: Overall cognitive status: Within functional limits for tasks assessed                          SENSATION: WFL   POSTURE: increased lumbar lordosis and larger body habitus   PALPATION: TTP to lower lumbar paraspinals, irritation around R illiac crest   LUMBAR ROM:    AROM eval  Flexion Decreased with pain  Extension Decreased with pain  Right lateral flexion    Left lateral flexion    Right rotation Decreased with pain  Left rotation Decreased with pain   (Blank rows = not tested)   LOWER EXTREMITY MMT:     MMT Right eval Left eval  Hip flexion 3+/5 4/5  Hip extension      Hip abduction 4/5 4/5  Hip adduction 4/5 4/5  Hip internal rotation      Hip external rotation      Knee flexion      Knee extension      Ankle dorsiflexion      Ankle plantarflexion      Ankle inversion      Ankle eversion       (Blank rows = not tested)   LUMBAR SPECIAL TESTS:  Straight leg raise test: Positive and Slump test: Positive   FUNCTIONAL TESTS:  30 Second Sit to Stand: 7 reps - with UE   GAIT: Distance walked: 52f Assistive device utilized: None Level of assistance: Complete Independence Comments: antalgic gait R   TREATMENT: [facility] Adult PT Treatment:  DATE: 12/09/2021 Therapeutic Exercise: NuStep lvl 5 UE/LE x 4 min while taking subjective STS 2x10 - holding 7# DB Bridge (small range) 2x15 LTR x 10 Supine SLR 2x10 Supine PPT x 10 - 5"  hold Supine PPT with ball x 10 - 5" hold S/L clamshell 2x15 GTB Supine figure 4 stretch 2x30" Seated physioball rollout fwd/lat x 15 each Standing hip abd/ext  x10 25#  [facility] Adult PT Treatment:                                                DATE: 12/05/2021 Therapeutic Exercise: NuStep lvl 5 UE/LE x 4 min while taking subjective STS 2x10 - holding 7# DB Bridge (small range) 2x15 LTR x 10 Supine SLR 2x15 Supine PPT x 10 - 5" hold S/L clamshell 2x15 GTB Supine 90/90 2x15" hold Supine figure 4 stretch 2x30" Seated physioball rollout fwd/lat x 15 each Standing hip abd/ext 2x10 25#  [facility] Adult PT Treatment:                                                DATE: 12/02/2021 Therapeutic Exercise: NuStep lvl 5 UE/LE x 4 min while taking subjective STS 2x10 - holding 5# DB Bridge (small range) 2x15 Supine PPT x 10 - 5" hold Supine clamshell 2x15 Black TB Supine SLR 2x15 Supine 90/90 2x15" hold LTR x 10 Supine figure 4 stretch 2x30" Seated physioball rollout fwd/lat x 15 each Standing hip abd/ext 2x10 25#  [facility] Adult PT Treatment:                                                DATE: 11/27/2021 Therapeutic Exercise: NuStep lvl 5 UE/LE x 3 min while taking subjective Supine PPT x 10 - 5" hold Bridge (small range) 2x10 LTR x 10 Supine clamshell 2x15 BTB Supine SLR 2x10 Seated ball squeeze 2x10 Seated physioball rollout fwd/lat x 10 each STS 2x10   [facility] Adult PT Treatment:                                                DATE: 11/19/2021 Therapeutic Exercise: Supine PPT x 5 - 5" hold Bridge (small range) x 5 LTR x 5 Seated sciatic nerve glide x 5 R     PATIENT EDUCATION:  Education details: HEP update Person educated: Patient Education method: Explanation, Demonstration, and Handouts Education comprehension: verbalized understanding and returned demonstration   HOME EXERCISE PROGRAM: Access Code: [email or web address] URL: [email or web address] Date: 11/19/2021 Prepared  by: Octavio Manns   Exercises - Supine Posterior Pelvic Tilt  - 2 x daily - 7 x weekly - 2 sets - 10 reps - 3 sec hold - Beginner Bridge  - 2 x daily - 7 x weekly - 3 sets - 10 reps - Supine Lower Trunk Rotation  - 2 x daily - 7 x weekly - 2 sets - 10 reps - Seated Sciatic Tensioner  - 2 x daily - 7 x weekly -  3 sets - 10 reps   ASSESSMENT:   CLINICAL IMPRESSION: Pt was able to complete all prescribed exercises with no adverse effect or increase in pain. Therapy focused on improving core and proximal hip strength in order to decrease pain and improve comfort. She is progressing as expected thus far, will continue per POC as prescribed.      OBJECTIVE IMPAIRMENTS: decreased activity tolerance, decreased endurance, decreased mobility, difficulty walking, decreased ROM, decreased strength, and pain.    ACTIVITY LIMITATIONS: standing, squatting, stairs, transfers, and locomotion level   PARTICIPATION LIMITATIONS: meal prep, cleaning, driving, shopping, community activity, and yard work   PERSONAL FACTORS: 1-2 comorbidities: HTN, DHF  are also affecting patient's functional outcome.      GOALS: Goals reviewed with patient? No   SHORT TERM GOALS: Target date: 12/10/2021   Pt will be compliant and knowledgeable with initial HEP for improved comfort and carryover Baseline: initial HEP given  Goal status: INITIAL   2.  Pt will self report lower back pain no greater than 6/10 for improved comfort and functional ability Baseline: 10/10 at worst Goal status: INITIAL    LONG TERM GOALS: Target date: 01/14/2022   Pt will self report lower back pain no greater than 3/10 for improved comfort and functional ability Baseline: 10/10 at worst Goal status: INITIAL    2.  Pt will be decrease ODI disability score to no greater than 30% as proxy for functional improvement Baseline: 48% disability  Goal status: INITIAL   3.  Pt will increase 30 Second Sit to Stand rep count to no less than 10 reps  for improved balance, strength, and functional mobility Baseline: 7 reps  Goal status: INITIAL    4.  Pt will improve all LE MMT to no less than 5/5 for imrpoved functional mobility and decreased pain Baseline: see chart Goal status: INITIAL   PLAN:   PT FREQUENCY: 2x/week   PT DURATION: 8 weeks   PLANNED INTERVENTIONS: Therapeutic exercises, Therapeutic activity, Neuromuscular re-education, Balance training, Gait training, Patient/Family education, Self Care, Joint mobilization, Dry Needling, Electrical stimulation, Cryotherapy, Moist heat, Vasopneumatic device, Manual therapy, and Re-evaluation.   PLAN FOR NEXT SESSION: assess HEP response, progress core and proximal hip strength   Ward Chatters, PT 12/09/2021, 9:09 AM

## 2021-12-11 ENCOUNTER — Ambulatory Visit: Payer: Medicaid Other

## 2021-12-11 DIAGNOSIS — M5459 Other low back pain: Secondary | ICD-10-CM

## 2021-12-11 DIAGNOSIS — M6281 Muscle weakness (generalized): Secondary | ICD-10-CM

## 2021-12-11 NOTE — Therapy (Signed)
OUTPATIENT PHYSICAL THERAPY TREATMENT NOTE   Patient Name: [name] MRN: [id number] DOB:02-05-1965, 56 y.o., female Today's Date: 12/11/2021  PCP: Holley Bouche, MD  REFERRING PROVIDER: Lenoria Chime, MD   END OF SESSION:   PT End of Session - 12/11/21 0908     Visit Number 6    Number of Visits 17    Date for PT Re-Evaluation 01/14/22    Authorization Type [employer]    PT Start Time 0915    PT Stop Time 0955    PT Time Calculation (min) 40 min    Activity Tolerance Patient tolerated treatment well    Behavior During Therapy [facility] for tasks assessed/performed               Past Medical History:  Diagnosis Date   Acute bilateral low back pain with right-sided sciatica 10/09/2014   Allergic rhinitis 05/11/2017   Back pain 06/05/2021   Bilateral leg cramps 03/28/2020   Cervical cancer screening 04/20/2020   Cough due to angiotensin-converting enzyme inhibitor 0/09/6281   Diastolic heart failure 06/6292   NEW ON ECHO 07/6544   Diastolic heart failure ([place]) 11/29/2010   ECHO performed 09/09/10 Left ventricle: The cavity size was normal. There was mild     concentric hypertrophy. Systolic function was vigorous. The     estimated ejection fraction was in the range of 65% to 70%. Wall     motion was normal; there were no regional wall motion     abnormalities. Features are consistent with a pseudonormal left     ventricular filling pattern, with concomitant abnormal   DUB (dysfunctional uterine bleeding) 04/04/2011   03/2011 U/S:Small fibroids, one of which demonstrates mass effect upon the endometrium and could correlate with history of dysfunctional uterine bleeding. Endometrial Biopsy 07/2012 Secretory endometrium with no hyperplasia or carcinoma.  07/2012 pap smear negative for intraepithelial neoplasia. [place] on menopausal range LH, TSH, Prolactin and Testosterone wnl.      Encounter for allergy testing 05/13/2020   Healthcare maintenance 04/29/2021   Hemoptysis 02/2010   DUE TO ALV  HGE.Marland KitchenMarland KitchenANA NEGATIVE...DEEMED DUE TO DIASTOLIC CHF   HEMOPTYSIS UNSPECIFIED 03/28/2010   Followed in Pulmonary clinic/ [facility]/ [facility] med regimen --med calendar 10/10/2010  3 episodes all related to hypertension with diastolic chf - last august 2012. CXR clear 11/21/10     History of anemia 11/29/2010   Last Hb 10.1 on 08/21 baseline around 9.    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   PPD positive    NEGATIVE DURING CHILDHOOD IN [employer]..1ST + TEST 1993 DURING IMMIGRATION GRREN CARD CLEARENCE.Marland KitchenCXR NORMAL 1993 PER HX..s/p INR Rx AT [facility] [id number].Marland KitchenREPEAT + 02/2010   Right knee pain 05/06/2011   Seasonal and perennial allergic rhinoconjunctivitis 11/05/2020   Upper airway cough syndrome 03/28/2020   Vitamin B12 deficiency 09/25/2016   Vitamin D deficiency 09/24/2016   Past Surgical History:  Procedure Laterality Date   CESAREAN SECTION     TUBAL LIGATION     Patient Active Problem List   Diagnosis Date Noted   Chronic right-sided low back pain without sciatica 11/13/2021   Muscle strain of shoulder region, left, sequela 07/03/2021   Hyperlipidemia 09/24/2016   GERD (gastroesophageal reflux disease) 10/09/2014   Hyperthyroidism 11/29/2010   Essential hypertension 03/28/2010    REFERRING DIAG: M54.50,G89.29 (ICD-10-CM) - Chronic  right-sided low back pain without sciatica   THERAPY DIAG:  Other low back pain  Muscle weakness (generalized)  Rationale for Evaluation and Treatment Rehabilitation  PERTINENT HISTORY: HTN, DHF   PRECAUTIONS: None   SUBJECTIVE:                                                                                                                                                                                      SUBJECTIVE STATEMENT:  Pt presents to PT with no current reports of pain or  discomfort in lower back. Has been compliant with HEP with no adverse effect or increase in pain. Is ready to begin PT at this time.    PAIN:  Are you having pain?  Yes: NPRS scale: 6/10 Best: 6/10 Worst: 10/10 Pain location: R side lower back, R LE Pain description: sharp Aggravating factors: unsure Relieving factors: None   OBJECTIVE: (objective measures completed at initial evaluation unless otherwise dated)  PATIENT SURVEYS:  ODI: 48% disability   COGNITION: Overall cognitive status: Within functional limits for tasks assessed                          SENSATION: WFL   POSTURE: increased lumbar lordosis and larger body habitus   PALPATION: TTP to lower lumbar paraspinals, irritation around R illiac crest   LUMBAR ROM:    AROM eval  Flexion Decreased with pain  Extension Decreased with pain  Right lateral flexion    Left lateral flexion    Right rotation Decreased with pain  Left rotation Decreased with pain   (Blank rows = not tested)   LOWER EXTREMITY MMT:     MMT Right eval Left eval  Hip flexion 3+/5 4/5  Hip extension      Hip abduction 4/5 4/5  Hip adduction 4/5 4/5  Hip internal rotation      Hip external rotation      Knee flexion      Knee extension      Ankle dorsiflexion      Ankle plantarflexion      Ankle inversion      Ankle eversion       (Blank rows = not tested)   LUMBAR SPECIAL TESTS:  Straight leg raise test: Positive and Slump test: Positive   FUNCTIONAL TESTS:  30 Second Sit to Stand: 7 reps - with UE   GAIT: Distance walked: 11f Assistive device utilized: None Level of assistance: Complete Independence Comments: antalgic gait R   TREATMENT: [facility] Adult PT Treatment:  DATE: 12/11/2021 Therapeutic Exercise: NuStep lvl 5 UE/LE x 4 min while taking subjective Paloff press 2x10 7# STS 2x10 - holding 7# DB Bridge (small range) 2x15 LTR x 10 Supine SLR 2x10 Supine PPT x 10 -  5" hold Supine 90/90 2x15" hold S/L clamshell 2x15 GTB Supine figure 4 stretch 2x30" Seated physioball rollout fwd/lat x 10 each Standing hip abd/ext x 10 25#  [facility] Adult PT Treatment:                                                DATE: 12/09/2021 Therapeutic Exercise: NuStep lvl 5 UE/LE x 4 min while taking subjective STS 2x10 - holding 7# DB Bridge (small range) 2x15 LTR x 10 Supine SLR 2x10 Supine PPT x 10 - 5" hold Supine PPT with ball x 10 - 5" hold S/L clamshell 2x15 GTB Supine figure 4 stretch 2x30" Seated physioball rollout fwd/lat x 15 each Standing hip abd/ext  x10 25#  [facility] Adult PT Treatment:                                                DATE: 12/05/2021 Therapeutic Exercise: NuStep lvl 5 UE/LE x 4 min while taking subjective STS 2x10 - holding 7# DB Bridge (small range) 2x15 LTR x 10 Supine SLR 2x15 Supine PPT x 10 - 5" hold S/L clamshell 2x15 GTB Supine 90/90 2x15" hold Supine figure 4 stretch 2x30" Seated physioball rollout fwd/lat x 15 each Standing hip abd/ext 2x10 25#  [facility] Adult PT Treatment:                                                DATE: 12/02/2021 Therapeutic Exercise: NuStep lvl 5 UE/LE x 4 min while taking subjective STS 2x10 - holding 5# DB Bridge (small range) 2x15 Supine PPT x 10 - 5" hold Supine clamshell 2x15 Black TB Supine SLR 2x15 Supine 90/90 2x15" hold LTR x 10 Supine figure 4 stretch 2x30" Seated physioball rollout fwd/lat x 15 each Standing hip abd/ext 2x10 25#  PATIENT EDUCATION:  Education details: HEP update Person educated: Patient Education method: Explanation, Demonstration, and Handouts Education comprehension: verbalized understanding and returned demonstration   HOME EXERCISE PROGRAM: Access Code: [email or web address] URL: [email or web address] Date: 11/19/2021 Prepared by: Octavio Manns   Exercises - Supine Posterior Pelvic Tilt  - 2 x daily - 7 x weekly - 2 sets - 10 reps - 3 sec hold - Beginner  Bridge  - 2 x daily - 7 x weekly - 3 sets - 10 reps - Supine Lower Trunk Rotation  - 2 x daily - 7 x weekly - 2 sets - 10 reps - Seated Sciatic Tensioner  - 2 x daily - 7 x weekly - 3 sets - 10 reps   ASSESSMENT:   CLINICAL IMPRESSION: Pt was able to complete all prescribed exercises with no adverse effect or increase in pain. Therapy focused on improving core and proximal hip strength in order to decrease pain and improve comfort. She is progressing as expected thus far, will continue per POC as prescribed.  OBJECTIVE IMPAIRMENTS: decreased activity tolerance, decreased endurance, decreased mobility, difficulty walking, decreased ROM, decreased strength, and pain.    ACTIVITY LIMITATIONS: standing, squatting, stairs, transfers, and locomotion level   PARTICIPATION LIMITATIONS: meal prep, cleaning, driving, shopping, community activity, and yard work   PERSONAL FACTORS: 1-2 comorbidities: HTN, DHF  are also affecting patient's functional outcome.      GOALS: Goals reviewed with patient? No   SHORT TERM GOALS: Target date: 12/10/2021   Pt will be compliant and knowledgeable with initial HEP for improved comfort and carryover Baseline: initial HEP given  Goal status: INITIAL   2.  Pt will self report lower back pain no greater than 6/10 for improved comfort and functional ability Baseline: 10/10 at worst Goal status: INITIAL    LONG TERM GOALS: Target date: 01/14/2022   Pt will self report lower back pain no greater than 3/10 for improved comfort and functional ability Baseline: 10/10 at worst Goal status: INITIAL    2.  Pt will be decrease ODI disability score to no greater than 30% as proxy for functional improvement Baseline: 48% disability  Goal status: INITIAL   3.  Pt will increase 30 Second Sit to Stand rep count to no less than 10 reps for improved balance, strength, and functional mobility Baseline: 7 reps  Goal status: INITIAL    4.  Pt will improve all LE MMT  to no less than 5/5 for imrpoved functional mobility and decreased pain Baseline: see chart Goal status: INITIAL   PLAN:   PT FREQUENCY: 2x/week   PT DURATION: 8 weeks   PLANNED INTERVENTIONS: Therapeutic exercises, Therapeutic activity, Neuromuscular re-education, Balance training, Gait training, Patient/Family education, Self Care, Joint mobilization, Dry Needling, Electrical stimulation, Cryotherapy, Moist heat, Vasopneumatic device, Manual therapy, and Re-evaluation.   PLAN FOR NEXT SESSION: assess HEP response, progress core and proximal hip strength   Ward Chatters, PT 12/11/2021, 9:56 AM

## 2021-12-16 ENCOUNTER — Ambulatory Visit: Payer: Medicaid Other

## 2021-12-16 DIAGNOSIS — M6281 Muscle weakness (generalized): Secondary | ICD-10-CM

## 2021-12-16 DIAGNOSIS — M5459 Other low back pain: Secondary | ICD-10-CM

## 2021-12-16 NOTE — Therapy (Signed)
OUTPATIENT PHYSICAL THERAPY TREATMENT NOTE   Patient Name: [name] MRN: [id number] DOB:January 17, 1966, 56 y.o., female Today's Date: 12/16/2021  PCP: Holley Bouche, MD  REFERRING PROVIDER: Lenoria Chime, MD   END OF SESSION:   PT End of Session - 12/16/21 0833     Visit Number 7    Number of Visits 17    Date for PT Re-Evaluation 01/14/22    Authorization Type MCD [employer]    PT Start Time 0830    PT Stop Time 0910    PT Time Calculation (min) 40 min    Activity Tolerance Patient tolerated treatment well    Behavior During Therapy [facility] for tasks assessed/performed                Past Medical History:  Diagnosis Date   Acute bilateral low back pain with right-sided sciatica 10/09/2014   Allergic rhinitis 05/11/2017   Back pain 06/05/2021   Bilateral leg cramps 03/28/2020   Cervical cancer screening 04/20/2020   Cough due to angiotensin-converting enzyme inhibitor 02/22/252   Diastolic heart failure 02/7060   NEW ON ECHO 03/7626   Diastolic heart failure ([place]) 11/29/2010   ECHO performed 09/09/10 Left ventricle: The cavity size was normal. There was mild     concentric hypertrophy. Systolic function was vigorous. The     estimated ejection fraction was in the range of 65% to 70%. Wall     motion was normal; there were no regional wall motion     abnormalities. Features are consistent with a pseudonormal left     ventricular filling pattern, with concomitant abnormal   DUB (dysfunctional uterine bleeding) 04/04/2011   03/2011 U/S:Small fibroids, one of which demonstrates mass effect upon the endometrium and could correlate with history of dysfunctional uterine bleeding. Endometrial Biopsy 07/2012 Secretory endometrium with no hyperplasia or carcinoma.  07/2012 pap smear negative for intraepithelial neoplasia. [place] on menopausal range LH, TSH, Prolactin and Testosterone wnl.      Encounter for allergy testing 05/13/2020   Healthcare maintenance 04/29/2021   Hemoptysis 02/2010   DUE TO ALV  HGE.Marland KitchenMarland KitchenANA NEGATIVE...DEEMED DUE TO DIASTOLIC CHF   HEMOPTYSIS UNSPECIFIED 03/28/2010   Followed in Pulmonary clinic/ [facility]/ [facility]asw[facility] Complex med regimen --med calendar 10/10/2010  3 episodes all related to hypertension with diastolic chf - last august 2012. CXR clear 11/21/10     History of anemia 11/29/2010   Last Hb 10.1 on 08/21 baseline around 9.    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   PPD positive    NEGATIVE DURING CHILDHOOD IN [employer]..1ST + TEST 1993 DURING IMMIGRATION GRREN CARD CLEARENCE.Marland KitchenCXR NORMAL 1993 PER HX..s/p INR Rx AT [facility] [id number].Marland KitchenREPEAT + 02/2010   Right knee pain 05/06/2011   Seasonal and perennial allergic rhinoconjunctivitis 11/05/2020   Upper airway cough syndrome 03/28/2020   Vitamin B12 deficiency 09/25/2016   Vitamin D deficiency 09/24/2016   Past Surgical History:  Procedure Laterality Date   CESAREAN SECTION     TUBAL LIGATION     Patient Active Problem List   Diagnosis Date Noted   Chronic right-sided low back pain without sciatica 11/13/2021   Muscle strain of shoulder region, left, sequela 07/03/2021   Hyperlipidemia 09/24/2016   GERD (gastroesophageal reflux disease) 10/09/2014   Hyperthyroidism 11/29/2010   Essential hypertension 03/28/2010    REFERRING DIAG: M54.50,G89.29 (ICD-10-CM) -  Chronic right-sided low back pain without sciatica   THERAPY DIAG:  Other low back pain  Muscle weakness (generalized)  Rationale for Evaluation and Treatment Rehabilitation  PERTINENT HISTORY: HTN, DHF   PRECAUTIONS: None   SUBJECTIVE:                                                                                                                                                                                      SUBJECTIVE STATEMENT:  Pt presents to PT with reports of no current LBP.  Has been compliant with HEP with no adverse effect. Therapy is ready to begin PT at this time.    PAIN:  Are you having pain?  No: NPRS scale: 0/10 Best: 6/10 Worst: 10/10 Pain location: R side lower back, R LE Pain description: sharp Aggravating factors: unsure Relieving factors: None   OBJECTIVE: (objective measures completed at initial evaluation unless otherwise dated)  PATIENT SURVEYS:  ODI: 48% disability   COGNITION: Overall cognitive status: Within functional limits for tasks assessed                          SENSATION: WFL   POSTURE: increased lumbar lordosis and larger body habitus   PALPATION: TTP to lower lumbar paraspinals, irritation around R illiac crest   LUMBAR ROM:    AROM eval  Flexion Decreased with pain  Extension Decreased with pain  Right lateral flexion    Left lateral flexion    Right rotation Decreased with pain  Left rotation Decreased with pain   (Blank rows = not tested)   LOWER EXTREMITY MMT:     MMT Right eval Left eval  Hip flexion 3+/5 4/5  Hip extension      Hip abduction 4/5 4/5  Hip adduction 4/5 4/5  Hip internal rotation      Hip external rotation      Knee flexion      Knee extension      Ankle dorsiflexion      Ankle plantarflexion      Ankle inversion      Ankle eversion       (Blank rows = not tested)   LUMBAR SPECIAL TESTS:  Straight leg raise test: Positive and Slump test: Positive   FUNCTIONAL TESTS:  30 Second Sit to Stand: 7 reps - with UE   GAIT: Distance walked: 85f Assistive device utilized: None Level of assistance: Complete Independence Comments: antalgic gait R   TREATMENT: [facility] Adult PT Treatment:  DATE: 12/16/2021 Therapeutic Exercise: NuStep lvl 5 UE/LE x 4 min while taking subjective STS 2x10 - holding 15# KB Bridge (small range) 2x15 LTR x 10 Supine SLR 2x10 2# Supine PPT x 10 - 5" hold Supine PPT 2x10 with pliates ring - 3"  hold Lateral walk GTB x 3 laps at counter Standing hip abd/ext 2x10 GTB  [facility] Adult PT Treatment:                                                DATE: 12/11/2021 Therapeutic Exercise: NuStep lvl 5 UE/LE x 4 min while taking subjective Paloff press 2x10 7# STS 2x10 - holding 7# DB Bridge (small range) 2x15 LTR x 10 Supine SLR 2x10 Supine PPT x 10 - 5" hold Supine 90/90 2x15" hold S/L clamshell 2x15 GTB Supine figure 4 stretch 2x30" Seated physioball rollout fwd/lat x 10 each Standing hip abd/ext x 10 25#  [facility] Adult PT Treatment:                                                DATE: 12/09/2021 Therapeutic Exercise: NuStep lvl 5 UE/LE x 4 min while taking subjective STS 2x10 - holding 7# DB Bridge (small range) 2x15 LTR x 10 Supine SLR 2x10 Supine PPT x 10 - 5" hold Supine PPT with ball x 10 - 5" hold S/L clamshell 2x15 GTB Supine figure 4 stretch 2x30" Seated physioball rollout fwd/lat x 15 each Standing hip abd/ext  x10 25#  PATIENT EDUCATION:  Education details: HEP update Person educated: Patient Education method: Explanation, Demonstration, and Handouts Education comprehension: verbalized understanding and returned demonstration   HOME EXERCISE PROGRAM: Access Code: [email or web address] URL: [email or web address] Date: 12/16/2021 Prepared by: Octavio Manns  Exercises - Supine Posterior Pelvic Tilt  - 2 x daily - 7 x weekly - 2 sets - 10 reps - 3 sec hold - Beginner Bridge  - 2 x daily - 7 x weekly - 3 sets - 10 reps - Supine Lower Trunk Rotation  - 2 x daily - 7 x weekly - 2 sets - 10 reps - Seated Sciatic Tensioner  - 2 x daily - 7 x weekly - 3 sets - 10 reps - Active Straight Leg Raise with Quad Set  - 1 x daily - 7 x weekly - 2 sets - 10 reps - Sit to Stand with Arms Crossed  - 1 x daily - 7 x weekly - 3 sets - 10 reps - Side Stepping with Resistance at Ankles and Counter Support  - 1 x daily - 7 x weekly - 3 sets - green theraband hold - Standing Hip  Abduction with Resistance at Ankles and Counter Support  - 1 x daily - 7 x weekly - 2 sets - 10 reps - green theraband hold - Standing Hip Extension with Resistance at Ankles and Counter Support  - 1 x daily - 7 x weekly - 2 sets - 10 reps - green theraband hold   ASSESSMENT:   CLINICAL IMPRESSION: Pt was able to complete all prescribed exercises with no adverse effect or increase in pain. Therapy focused on improving core and proximal hip strength in order to decrease pain and improve comfort. She is progressing as expected  thus far, will continue per POC as prescribed.    OBJECTIVE IMPAIRMENTS: decreased activity tolerance, decreased endurance, decreased mobility, difficulty walking, decreased ROM, decreased strength, and pain.    ACTIVITY LIMITATIONS: standing, squatting, stairs, transfers, and locomotion level   PARTICIPATION LIMITATIONS: meal prep, cleaning, driving, shopping, community activity, and yard work   PERSONAL FACTORS: 1-2 comorbidities: HTN, DHF  are also affecting patient's functional outcome.      GOALS: Goals reviewed with patient? No   SHORT TERM GOALS: Target date: 12/10/2021   Pt will be compliant and knowledgeable with initial HEP for improved comfort and carryover Baseline: initial HEP given  Goal status: INITIAL   2.  Pt will self report lower back pain no greater than 6/10 for improved comfort and functional ability Baseline: 10/10 at worst Goal status: INITIAL    LONG TERM GOALS: Target date: 01/14/2022   Pt will self report lower back pain no greater than 3/10 for improved comfort and functional ability Baseline: 10/10 at worst Goal status: INITIAL    2.  Pt will be decrease ODI disability score to no greater than 30% as proxy for functional improvement Baseline: 48% disability  Goal status: INITIAL   3.  Pt will increase 30 Second Sit to Stand rep count to no less than 10 reps for improved balance, strength, and functional mobility Baseline: 7  reps  Goal status: INITIAL    4.  Pt will improve all LE MMT to no less than 5/5 for imrpoved functional mobility and decreased pain Baseline: see chart Goal status: INITIAL   PLAN:   PT FREQUENCY: 2x/week   PT DURATION: 8 weeks   PLANNED INTERVENTIONS: Therapeutic exercises, Therapeutic activity, Neuromuscular re-education, Balance training, Gait training, Patient/Family education, Self Care, Joint mobilization, Dry Needling, Electrical stimulation, Cryotherapy, Moist heat, Vasopneumatic device, Manual therapy, and Re-evaluation.   PLAN FOR NEXT SESSION: assess HEP response, progress core and proximal hip strength   Ward Chatters, PT 12/16/2021, 9:10 AM

## 2021-12-17 NOTE — Therapy (Signed)
OUTPATIENT PHYSICAL THERAPY TREATMENT NOTE/DISCHARGE  PHYSICAL THERAPY DISCHARGE SUMMARY  Visits from Start of Care: 8  Current functional level related to goals / functional outcomes: See goals and objective   Remaining deficits: See goals and objective   Education / Equipment: HEP   Patient agrees to discharge. Patient goals were met. Patient is being discharged due to meeting the stated rehab goals.   Patient Name: [name] MRN: [id number] DOB:01/14/1966, 56 y.o.,, female Today's Date: 12/18/2021  PCP: Holley Bouche, MD  REFERRING PROVIDER: Lenoria Chime, MD   END OF SESSION:   PT End of Session - 12/18/21 0920     Visit Number 8    Number of Visits 17    Date for PT Re-Evaluation 01/14/22    Authorization Type MCD [employer]    PT Start Time 0917    PT Stop Time 0955    PT Time Calculation (min) 38 min    Activity Tolerance Patient tolerated treatment well    Behavior During Therapy [facility] for tasks assessed/performed                 Past Medical History:  Diagnosis Date   Acute bilateral low back pain with right-sided sciatica 10/09/2014   Allergic rhinitis 05/11/2017   Back pain 06/05/2021   Bilateral leg cramps 03/28/2020   Cervical cancer screening 04/20/2020   Cough due to angiotensin-converting enzyme inhibitor 02/27/348   Diastolic heart failure 0/9381   NEW ON ECHO 08/2991   Diastolic heart failure ([place]) 11/29/2010   ECHO performed 09/09/10 Left ventricle: The cavity size was normal. There was mild     concentric hypertrophy. Systolic function was vigorous. The     estimated ejection fraction was in the range of 65% to 70%. Wall     motion was normal; there were no regional wall motion     abnormalities. Features are consistent with a pseudonormal left     ventricular filling pattern, with concomitant abnormal   DUB (dysfunctional uterine bleeding) 04/04/2011   03/2011 U/S:Small fibroids, one of which demonstrates mass effect upon the endometrium and could  correlate with history of dysfunctional uterine bleeding. Endometrial Biopsy 07/2012 Secretory endometrium with no hyperplasia or carcinoma.  07/2012 pap smear negative for intraepithelial neoplasia. [place] on menopausal range LH, TSH, Prolactin and Testosterone wnl.      Encounter for allergy testing 05/13/2020   Healthcare maintenance 04/29/2021   Hemoptysis 02/2010   DUE TO ALV HGE.Marland KitchenMarland KitchenANA NEGATIVE...DEEMED DUE TO DIASTOLIC CHF   HEMOPTYSIS UNSPECIFIED 03/28/2010   Followed in Pulmonary clinic/ [facility]/ [facility] med regimen --med calendar 10/10/2010  3 episodes all related to hypertension with diastolic chf - last august 2012. CXR clear 11/21/10     History of anemia 11/29/2010   Last Hb 10.1 on 08/21 baseline around 9.    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   PPD positive    NEGATIVE DURING CHILDHOOD IN [employer]..1ST + TEST 1993 DURING IMMIGRATION GRREN CARD CLEARENCE.Marland KitchenCXR NORMAL 1993 PER HX..s/p INR Rx AT [facility] [id number].Marland KitchenREPEAT + 02/2010   Right knee pain 05/06/2011   Seasonal and perennial allergic rhinoconjunctivitis 11/05/2020   Upper airway cough syndrome 03/28/2020   Vitamin B12 deficiency 09/25/2016   Vitamin D deficiency 09/24/2016   Past Surgical History:  Procedure Laterality Date   CESAREAN SECTION     TUBAL  LIGATION     Patient Active Problem List   Diagnosis Date Noted   Chronic right-sided low back pain without sciatica 11/13/2021   Muscle strain of shoulder region, left, sequela 07/03/2021   Hyperlipidemia 09/24/2016   GERD (gastroesophageal reflux disease) 10/09/2014   Hyperthyroidism 11/29/2010   Essential hypertension 03/28/2010    REFERRING DIAG: M54.50,G89.29 (ICD-10-CM) - Chronic right-sided low back pain without sciatica   THERAPY DIAG:  Other low back pain  Muscle weakness  (generalized)  Rationale for Evaluation and Treatment Rehabilitation  PERTINENT HISTORY: HTN, DHF   PRECAUTIONS: None   SUBJECTIVE:                                                                                                                                                                                      SUBJECTIVE STATEMENT:  Pt presents to PT with no current pain. Has been compliant with HEP with no adverse effect. Feels like she is doing well. Pt is ready to begin PT at this time.    PAIN:  Are you having pain?  No: NPRS scale: 0/10 Best: 6/10 Worst: 10/10 Pain location: R side lower back, R LE Pain description: sharp Aggravating factors: unsure Relieving factors: None   OBJECTIVE: (objective measures completed at initial evaluation unless otherwise dated)  PATIENT SURVEYS:  ODI: 24% disability - 12/18/2021   COGNITION: Overall cognitive status: Within functional limits for tasks assessed                          SENSATION: WFL   POSTURE: increased lumbar lordosis and larger body habitus   PALPATION: TTP to lower lumbar paraspinals, irritation around R illiac crest   LUMBAR ROM:    AROM eval  Flexion Decreased with pain  Extension Decreased with pain  Right lateral flexion    Left lateral flexion    Right rotation Decreased with pain  Left rotation Decreased with pain   (Blank rows = not tested)   LOWER EXTREMITY MMT:     MMT Right eval Left eval Right 12/18/21 Left 12/18/21  Hip flexion 3+/5 4/5 5/5 5/5  Hip extension        Hip abduction 4/5 4/5 5/5 5/5  Hip adduction 4/5 4/5 5/5 5/5  Hip internal rotation        Hip external rotation        Knee flexion        Knee extension        Ankle dorsiflexion        Ankle plantarflexion        Ankle inversion  Ankle eversion         (Blank rows = not tested)   LUMBAR SPECIAL TESTS:  Straight leg raise test: Positive and Slump test: Positive   FUNCTIONAL TESTS:  30 Second Sit to  Stand: 8 reps - 12/18/2021   GAIT: Distance walked: 21f Assistive device utilized: None Level of assistance: Complete Independence Comments: antalgic gait R   TREATMENT: [facility] Adult PT Treatment:                                                DATE: 12/18/2021 Therapeutic Exercise: NuStep lvl 5 UE/LE x 3 min while taking subjective STS x 10 LTR x 10 Bridge (small range) 2x15 Supine SLR 2x10 2# Supine PPT x 10 - 5" hold Supine fig 4 stretch 2x30" Lateral walk GTB x 3 laps at counter Standing hip abd/ext 2x10 GTB  [facility] Adult PT Treatment:                                                DATE: 12/16/2021 Therapeutic Exercise: NuStep lvl 5 UE/LE x 4 min while taking subjective STS 2x10 - holding 15# KB Bridge (small range) 2x15 LTR x 10 Supine SLR 2x10 2# Supine PPT x 10 - 5" hold Supine PPT 2x10 with pliates ring - 3" hold Lateral walk GTB x 3 laps at counter Standing hip abd/ext 2x10 GTB  [facility] Adult PT Treatment:                                                DATE: 12/11/2021 Therapeutic Exercise: NuStep lvl 5 UE/LE x 4 min while taking subjective Paloff press 2x10 7# STS 2x10 - holding 7# DB Bridge (small range) 2x15 LTR x 10 Supine SLR 2x10 Supine PPT x 10 - 5" hold Supine 90/90 2x15" hold S/L clamshell 2x15 GTB Supine figure 4 stretch 2x30" Seated physioball rollout fwd/lat x 10 each Standing hip abd/ext x 10 25#  PATIENT EDUCATION:  Education details: final HEP update Person educated: Patient Education method: Explanation, Demonstration, and Handouts Education comprehension: verbalized understanding and returned demonstration   HOME EXERCISE PROGRAM: Access Code: [email or web address]: [email or web address] Date: 12/18/2021 Prepared by: DOctavio Manns Exercises - Supine Posterior Pelvic Tilt  - 3-4 x weekly - 2 sets - 10 reps - 3 sec hold - Beginner Bridge  - 3-4 x weekly - 3 sets - 10 reps - Supine Lower Trunk Rotation  - 3-4 x weekly - 2 sets - 10  reps - Active Straight Leg Raise with Quad Set  - 3-4 x weekly - 2 sets - 10 reps - Supine Figure 4 Piriformis Stretch  - 3-4 x weekly - 2 reps - 30 sec hold - Seated Sciatic Tensioner  - 3-4 x weekly - 3 sets - 10 reps - Sit to Stand with Arms Crossed  - 3-4 x weekly - 3 sets - 10 reps - Side Stepping with Resistance at Ankles and Counter Support  - 3-4 x weekly - 3 sets - green theraband hold - Standing Hip Abduction with Resistance at Ankles and Counter Support  -  3-4 x weekly - 2 sets - 10 reps - green theraband hold - Standing Hip Extension with Resistance at Ankles and Counter Support  - 3-4 x weekly - 2 sets - 10 reps - green theraband hold   ASSESSMENT:   CLINICAL IMPRESSION: Pt was able to complete prescribed exercises and demonstrated knowledge of HEP with no adverse effect. Over the course of PT treatment she has progressed very well, noting decreased LBP and meeting all LTGs of rehab. She should continue to improve with HEP compliance and is ready to discharge from skilled PT at this time.    OBJECTIVE IMPAIRMENTS: decreased activity tolerance, decreased endurance, decreased mobility, difficulty walking, decreased ROM, decreased strength, and pain.    ACTIVITY LIMITATIONS: standing, squatting, stairs, transfers, and locomotion level   PARTICIPATION LIMITATIONS: meal prep, cleaning, driving, shopping, community activity, and yard work   PERSONAL FACTORS: 1-2 comorbidities: HTN, DHF  are also affecting patient's functional outcome.      GOALS: Goals reviewed with patient? No   SHORT TERM GOALS: Target date: 12/10/2021   Pt will be compliant and knowledgeable with initial HEP for improved comfort and carryover Baseline: initial HEP given  Goal status: MET   2.  Pt will self report lower back pain no greater than 6/10 for improved comfort and functional ability Baseline: 10/10 at worst Goal status: MET   LONG TERM GOALS: Target date: 01/14/2022   Pt will self report  lower back pain no greater than 3/10 for improved comfort and functional ability Baseline: 10/10 at worst Goal status: MET    2.  Pt will be decrease ODI disability score to no greater than 30% as proxy for functional improvement Baseline: 48% disability  12/18/2021: 24% disability Goal status: MET   3.  Pt will increase 30 Second Sit to Stand rep count to no less than 10 reps for improved balance, strength, and functional mobility Baseline: 7 reps  12/18/2021: 8 reps Goal status: MOSTLY MET    4.  Pt will improve all LE MMT to no less than 5/5 for imrpoved functional mobility and decreased pain Baseline: see chart Goal status: MET   PLAN:   PT FREQUENCY: 2x/week   PT DURATION: 8 weeks   PLANNED INTERVENTIONS: Therapeutic exercises, Therapeutic activity, Neuromuscular re-education, Balance training, Gait training, Patient/Family education, Self Care, Joint mobilization, Dry Needling, Electrical stimulation, Cryotherapy, Moist heat, Vasopneumatic device, Manual therapy, and Re-evaluation.   PLAN FOR NEXT SESSION: assess HEP response, progress core and proximal hip strength   Ward Chatters, PT 12/18/2021, 9:56 AM

## 2021-12-18 ENCOUNTER — Ambulatory Visit: Payer: Medicaid Other

## 2021-12-18 DIAGNOSIS — M5459 Other low back pain: Secondary | ICD-10-CM | POA: Diagnosis not present

## 2021-12-18 DIAGNOSIS — M6281 Muscle weakness (generalized): Secondary | ICD-10-CM

## 2021-12-19 ENCOUNTER — Ambulatory Visit (AMBULATORY_SURGERY_CENTER): Payer: Self-pay | Admitting: *Deleted

## 2021-12-19 VITALS — Ht 65.0 in | Wt 233.2 lb

## 2021-12-19 DIAGNOSIS — Z86010 Personal history of colonic polyps: Secondary | ICD-10-CM

## 2021-12-19 MED ORDER — PEG 3350-KCL-NA BICARB-NACL 420 G PO SOLR: 4000.0000 mL | Freq: Once | ORAL | 0 refills | Status: AC

## 2021-12-19 MED ORDER — POLYETHYLENE GLYCOL 3350 17 GM/SCOOP PO POWD: 1.0000 | Freq: Every day | ORAL | 3 refills | Status: DC

## 2021-12-19 NOTE — Progress Notes (Signed)
No egg or soy allergy known to patient  No issues known to pt with past sedation with any surgeries or procedures Patient denies ever being told they had issues or difficulty with intubation No hxof No FH of Malignant Hyperthermia Pt is not on diet pills Pt is not on  home 02  Pt is not on blood thinners  Pt has issues with constipation. Pt agrees to do  going 5 days Miralax Pt encouraged to use to use Singlecare or Goodrx to reduce cost  In person

## 2021-12-19 NOTE — Addendum Note (Signed)
Addended by: Jacqualine Code on: 12/19/2021 11:36 AM   Modules accepted: Orders

## 2022-01-15 ENCOUNTER — Encounter: Payer: Medicaid Other | Admitting: Gastroenterology

## 2022-01-15 ENCOUNTER — Ambulatory Visit: Payer: Medicaid Other | Admitting: Student

## 2022-01-28 ENCOUNTER — Ambulatory Visit (AMBULATORY_SURGERY_CENTER): Payer: Medicaid Other | Admitting: Gastroenterology

## 2022-01-28 ENCOUNTER — Encounter: Payer: Self-pay | Admitting: Gastroenterology

## 2022-01-28 VITALS — BP 121/67 | HR 67 | Temp 98.7°F | Resp 16 | Ht 65.0 in | Wt 233.0 lb

## 2022-01-28 DIAGNOSIS — Z09 Encounter for follow-up examination after completed treatment for conditions other than malignant neoplasm: Secondary | ICD-10-CM

## 2022-01-28 DIAGNOSIS — K635 Polyp of colon: Secondary | ICD-10-CM

## 2022-01-28 DIAGNOSIS — I10 Essential (primary) hypertension: Secondary | ICD-10-CM | POA: Diagnosis not present

## 2022-01-28 DIAGNOSIS — Z86010 Personal history of colonic polyps: Secondary | ICD-10-CM

## 2022-01-28 DIAGNOSIS — D123 Benign neoplasm of transverse colon: Secondary | ICD-10-CM

## 2022-01-28 DIAGNOSIS — Z1211 Encounter for screening for malignant neoplasm of colon: Secondary | ICD-10-CM | POA: Diagnosis not present

## 2022-01-28 MED ORDER — SODIUM CHLORIDE 0.9 % IV SOLN: 500.0000 mL | Freq: Once | INTRAVENOUS | Status: DC

## 2022-01-28 NOTE — Progress Notes (Unsigned)
GASTROENTEROLOGY PROCEDURE H&P NOTE   Primary Care Physician: Holley Bouche, MD  HPI: [name] is a 57 y.o. female who presents for Colonoscopy for surveillance in setting of previous adenomatous polyps.  Past Medical History:  Diagnosis Date   Acute bilateral low back pain with right-sided sciatica 10/09/2014   Allergic rhinitis 05/11/2017   Allergy    Back pain 06/05/2021   Bilateral leg cramps 03/28/2020   Cervical cancer screening 04/20/2020   Cough due to angiotensin-converting enzyme inhibitor 68/03/2120   Diastolic heart failure 48/2500   NEW ON ECHO 03/7046   Diastolic heart failure ([place]) 11/29/2010   ECHO performed 09/09/10 Left ventricle: The cavity size was normal. There was mild     concentric hypertrophy. Systolic function was vigorous. The     estimated ejection fraction was in the range of 65% to 70%. Wall     motion was normal; there were no regional wall motion     abnormalities. Features are consistent with a pseudonormal left     ventricular filling pattern, with concomitant abnormal   DUB (dysfunctional uterine bleeding) 04/04/2011   03/2011 U/S:Small fibroids, one of which demonstrates mass effect upon the endometrium and could correlate with history of dysfunctional uterine bleeding. Endometrial Biopsy 07/2012 Secretory endometrium with no hyperplasia or carcinoma.  07/2012 pap smear negative for intraepithelial neoplasia. Bailey on menopausal range LH, TSH, Prolactin and Testosterone wnl.      Encounter for allergy testing 05/13/2020   Healthcare maintenance 04/29/2021   Hemoptysis 02/2010   DUE TO ALV HGE.Marland KitchenMarland KitchenANA NEGATIVE...DEEMED DUE TO DIASTOLIC CHF   HEMOPTYSIS UNSPECIFIED 03/28/2010   Followed in Pulmonary clinic/ [facility]/ [facility] med regimen --med calendar 10/10/2010  3 episodes all related to hypertension with diastolic chf - last august 2012. CXR clear 11/21/10     History of anemia 11/29/2010   Last Hb 10.1 on 08/21 baseline around 9.     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   PPD positive    NEGATIVE DURING CHILDHOOD IN [employer]..1ST + TEST 1993 DURING IMMIGRATION GRREN CARD CLEARENCE.Marland KitchenCXR NORMAL 1993 PER HX..s/p INR Rx AT [id number].Marland KitchenREPEAT + 02/2010   Right knee pain 05/06/2011   Seasonal and perennial allergic rhinoconjunctivitis 11/05/2020   Upper airway cough syndrome 03/28/2020   Vitamin B12 deficiency 09/25/2016   Vitamin D deficiency 09/24/2016   Past Surgical History:  Procedure Laterality Date   CESAREAN SECTION     TUBAL LIGATION     Current Outpatient Medications  Medication Sig Dispense Refill   amLODipine (NORVASC) 10 MG tablet Take 1 tablet at night 90 tablet 1   meloxicam (MOBIC) 15 MG tablet Take 15 mg by mouth as needed for pain.     metoprolol tartrate (LOPRESSOR) 25 MG tablet Take 1 tablet (25 mg total) by mouth 2 (two) times daily. 180 tablet 3   azelastine (ASTELIN) 0.1 % nasal spray 2 sprays in each nostril twice a day as needed.  You may use this as needed for nasal congestion/itchy ears if desired (Patient not taking: Reported on 12/19/2021) 30 mL 4   cyclobenzaprine (FLEXERIL) 5 MG tablet Take 1 tablet (5 mg total) by mouth 3 (three) times daily as needed for muscle spasms. (Patient not taking: Reported on 12/19/2021) 40 tablet 1   Current Facility-Administered Medications  Medication Dose Route Frequency Provider Last Rate  Last Admin   0.9 %  sodium chloride infusion  500 mL Intravenous Once Mansouraty, Telford Nab., MD        Current Outpatient Medications:    amLODipine (NORVASC) 10 MG tablet, Take 1 tablet at night, Disp: 90 tablet, Rfl: 1   meloxicam (MOBIC) 15 MG tablet, Take 15 mg by mouth as needed for pain., Disp: , Rfl:    metoprolol tartrate (LOPRESSOR) 25 MG tablet, Take 1 tablet (25 mg total) by mouth 2 (two)  times daily., Disp: 180 tablet, Rfl: 3   azelastine (ASTELIN) 0.1 % nasal spray, 2 sprays in each nostril twice a day as needed.  You may use this as needed for nasal congestion/itchy ears if desired (Patient not taking: Reported on 12/19/2021), Disp: 30 mL, Rfl: 4   cyclobenzaprine (FLEXERIL) 5 MG tablet, Take 1 tablet (5 mg total) by mouth 3 (three) times daily as needed for muscle spasms. (Patient not taking: Reported on 12/19/2021), Disp: 40 tablet, Rfl: 1  Current Facility-Administered Medications:    0.9 %  sodium chloride infusion, 500 mL, Intravenous, Once, Mansouraty, Telford Nab., MD No Known Allergies Family History  Problem Relation Age of Onset   Hypertension Father    Sudden death Father    Hyperlipidemia Neg Hx    Heart attack Neg Hx    Diabetes Neg Hx    Colon cancer Neg Hx    Colon polyps Neg Hx    Esophageal cancer Neg Hx    Stomach cancer Neg Hx    Rectal cancer Neg Hx    Social History   Socioeconomic History   Marital status: Divorced    Spouse name: Not on file   Number of children: 5   Years of education: Not on file   Highest education level: Not on file  Occupational History   Occupation: Hair Stylist    Comment: works 4 days per week  Tobacco Use   Smoking status: Never    Passive exposure: Never   Smokeless tobacco: Never  Substance and Sexual Activity   Alcohol use: No   Drug use: No   Sexual activity: Yes    Birth control/protection: Surgical, Post-menopausal    Comment: BTL 57 years old  Other Topics Concern   Not on file  Social History Narrative   12/10/20- Has 5 children, her twin 45 year old boys live with her, patient is Muslim   Social Determinants of Radio broadcast assistant Strain: Low Risk  (12/10/2020)   Overall Financial Resource Strain (CARDIA)    Difficulty of Paying Living Expenses: Not hard at all  Food Insecurity: No Food Insecurity (12/10/2020)   Hunger Vital Sign    Worried About Running Out of Food in the Last  Year: Never true    Ran Out of Food in the Last Year: Never true  Transportation Needs: No Transportation Needs (12/10/2020)   PRAPARE - Hydrologist (Medical): No    Lack of Transportation (Non-Medical): No  Physical Activity: Sufficiently Active (12/10/2020)   Exercise Vital Sign    Days of Exercise per Week: 7 days    Minutes of Exercise per Session: 50 min  Stress: Not on file  Social Connections: Moderately Integrated (12/10/2020)   Social Connection and Isolation Panel [NHANES]    Frequency of Communication with Friends and Family: More than three times a week    Frequency of Social Gatherings with Friends and Family: More than three times a week  Attends Religious Services: More than 4 times per year    Active Member of Clubs or Organizations: Yes    Attends Archivist Meetings: More than 4 times per year    Marital Status: Divorced  Intimate Partner Violence: Not on file    Physical Exam: Today's Vitals   01/28/22 1441 01/28/22 1446  BP: (!) 154/73   Pulse: 68   Temp:  98.7 F (37.1 C)  SpO2: 98%   Weight: 233 lb (105.7 kg)   Height: '5\' 5"'$  (1.651 m)    Body mass index is 38.77 kg/m. GEN: NAD EYE: Sclerae anicteric ENT: MMM CV: Non-tachycardic GI: Soft, NT/ND NEURO:  Alert & Oriented x 3  Lab Results: No results for input(s): "WBC", "HGB", "HCT", "PLT" in the last 72 hours. BMET No results for input(s): "NA", "K", "CL", "CO2", "GLUCOSE", "BUN", "CREATININE", "CALCIUM" in the last 72 hours. LFT No results for input(s): "PROT", "ALBUMIN", "AST", "ALT", "ALKPHOS", "BILITOT", "BILIDIR", "IBILI" in the last 72 hours. PT/INR No results for input(s): "LABPROT", "INR" in the last 72 hours.   Impression / Plan: This is a 57 y.o.female ho presents for Colonoscopy for surveillance in setting of previous adenomatous polyps.  The risks and benefits of endoscopic evaluation/treatment were discussed with the patient and/or family;  these include but are not limited to the risk of perforation, infection, bleeding, missed lesions, lack of diagnosis, severe illness requiring hospitalization, as well as anesthesia and sedation related illnesses.  The patient's history has been reviewed, patient examined, no change in status, and deemed stable for procedure.  The patient and/or family is agreeable to proceed.    Justice Britain, MD [facility] Advanced [facility]oscopy Office # [phone]

## 2022-01-28 NOTE — Progress Notes (Unsigned)
Report to PACU, RN, vss, BBS= Clear.  

## 2022-01-28 NOTE — Patient Instructions (Addendum)
Read all of the handouts given to you by your recovery room nurse.   Resume all of your current medications today.  YOU HAD AN ENDOSCOPIC PROCEDURE TODAY AT [facility]:   Refer to the procedure report that was given to you for any specific questions about what was found during the examination.  If the procedure report does not answer your questions, please call your gastroenterologist to clarify.  If you requested that your care partner not be given the details of your procedure findings, then the procedure report has been included in a sealed envelope for you to review at your convenience later.  YOU SHOULD EXPECT: Some feelings of bloating in the abdomen. Passage of more gas than usual.  Walking can help get rid of the air that was put into your GI tract during the procedure and reduce the bloating. If you had a lower endoscopy (such as a colonoscopy or flexible sigmoidoscopy) you may notice spotting of blood in your stool or on the toilet paper. If you underwent a bowel prep for your procedure, you may not have a normal bowel movement for a few days.  Please Note:  You might notice some irritation and congestion in your nose or some drainage.  This is from the oxygen used during your procedure.  There is no need for concern and it should clear up in a day or so.  SYMPTOMS TO REPORT IMMEDIATELY:  Following lower endoscopy (colonoscopy or flexible sigmoidoscopy):  Excessive amounts of blood in the stool  Significant tenderness or worsening of abdominal pains  Swelling of the abdomen that is new, acute  Fever of 100F or higher     For urgent or emergent issues, a gastroenterologist can be reached at any hour by calling [phone]. Do not use MyChart messaging for urgent concerns.    DIET:  We do recommend a small meal at first, but then you may proceed to your regular diet.  Drink plenty of fluids but you should avoid alcoholic beverages for 24 hours.  Use fiber com 1-2  tablets per day.  ACTIVITY:  You should plan to take it easy for the rest of today and you should NOT DRIVE or use heavy machinery until tomorrow (because of the sedation medicines used during the test).    FOLLOW UP: Our staff will call the number listed on your records the next business day following your procedure.  We will call around 7:15- 8:00 am to check on you and address any questions or concerns that you may have regarding the information given to you following your procedure. If we do not reach you, we will leave a message.     If any biopsies were taken you will be contacted by phone or by letter within the next 1-3 weeks.  Please call us at [phone] if you have not heard about the biopsies in 3 weeks.    SIGNATURES/CONFIDENTIALITY: You and/or your care partner have signed paperwork which will be entered into your electronic medical record.  These signatures attest to the fact that that the information above on your After Visit Summary has been reviewed and is understood.  Full responsibility of the confidentiality of this discharge information lies with you and/or your care-partner.

## 2022-01-28 NOTE — Progress Notes (Unsigned)
Pt's states no medical or surgical changes since previsit or office visit. 

## 2022-01-28 NOTE — Op Note (Signed)
[facility] Patient Name: [name] Procedure Date: 01/28/2022 3:26 PM MRN: [id number] Endoscopist: Justice Britain , MD, [id number] Age: 57 Referring MD:  Date of Birth: January 25, 1965 Gender: Female Account #: [id number] Procedure:                Colonoscopy Indications:              Surveillance: Personal history of adenomatous                            polyps on last colonoscopy > 5 years ago Medicines:                Monitored Anesthesia Care Procedure:                Pre-Anesthesia Assessment:                           - Prior to the procedure, a History and Physical                            was performed, and patient medications and                            allergies were reviewed. The patient's tolerance of                            previous anesthesia was also reviewed. The risks                            and benefits of the procedure and the sedation                            options and risks were discussed with the patient.                            All questions were answered, and informed consent                            was obtained. Prior Anticoagulants: The patient has                            taken no anticoagulant or antiplatelet agents                            except for NSAID medication. ASA Grade Assessment:                            II - A patient with mild systemic disease. After                            reviewing the risks and benefits, the patient was                            deemed in satisfactory condition to undergo the  procedure.                           After obtaining informed consent, the colonoscope                            was passed under direct vision. Throughout the                            procedure, the patient's blood pressure, pulse, and                            oxygen saturations were monitored continuously. The                            Olympus CF-HQ190L [id number]) Colonoscope  was                            introduced through the anus and advanced to the 3                            cm into the ileum. The colonoscopy was performed                            without difficulty. The patient tolerated the                            procedure. The quality of the bowel preparation was                            adequate. The terminal ileum, ileocecal valve,                            appendiceal orifice, and rectum were photographed. Scope In: 3:33:15 PM Scope Out: 3:47:28 PM Scope Withdrawal Time: 0 hours 10 minutes 21 seconds  Total Procedure Duration: 0 hours 14 minutes 13 seconds  Findings:                 Skin tags were found on perianal exam.                           The digital rectal exam findings include                            hemorrhoids. Pertinent negatives include no                            palpable rectal lesions.                           The terminal ileum and ileocecal valve appeared                            normal.  Three sessile polyps were found in the transverse                            colon (1) and hepatic flexure (2). The polyps were                            2 to 5 mm in size. These polyps were removed with a                            cold snare. Resection and retrieval were complete.                           Normal mucosa was found in the entire colon                            otherwise.                           Non-bleeding non-thrombosed external and internal                            hemorrhoids were found during retroflexion, during                            perianal exam and during digital exam. The                            hemorrhoids were Grade II (internal hemorrhoids                            that prolapse but reduce spontaneously). Complications:            No immediate complications. Estimated Blood Loss:     Estimated blood loss was minimal. Impression:               - Perianal  skin tags found on perianal exam.                           - Hemorrhoids found on digital rectal exam.                           - The examined portion of the ileum was normal.                           - Three 2 to 5 mm polyps in the transverse colon                            and at the hepatic flexure, removed with a cold                            snare. Resected and retrieved.                           - Normal mucosa in the entire examined colon  otherwise.                           - Non-bleeding non-thrombosed external and internal                            hemorrhoids. Recommendation:           - The patient will be observed post-procedure,                            until all discharge criteria are met.                           - Discharge patient to home.                           - Patient has a contact number available for                            emergencies. The signs and symptoms of potential                            delayed complications were discussed with the                            patient. Return to normal activities tomorrow.                            Written discharge instructions were provided to the                            patient.                           - High fiber diet.                           - Use FiberCon 1-2 tablets PO daily.                           - Continue present medications.                           - Await pathology results.                           - Repeat colonoscopy in 3/5/7 years for                            surveillance based on pathology results.                           - The findings and recommendations were discussed                            with the patient.                           -  The findings and recommendations were discussed                            with the patient's family. Justice Britain, MD 01/28/2022 3:53:34 PM

## 2022-01-29 ENCOUNTER — Telehealth: Payer: Self-pay

## 2022-01-29 NOTE — Telephone Encounter (Signed)
No answer, left message to call if having any issues or concerns, B.Jex Strausbaugh RN 

## 2022-02-01 ENCOUNTER — Encounter: Payer: Self-pay | Admitting: Gastroenterology

## 2022-02-09 ENCOUNTER — Other Ambulatory Visit: Payer: Self-pay | Admitting: Family Medicine

## 2022-02-09 DIAGNOSIS — I10 Essential (primary) hypertension: Secondary | ICD-10-CM

## 2022-04-07 ENCOUNTER — Ambulatory Visit (HOSPITAL_BASED_OUTPATIENT_CLINIC_OR_DEPARTMENT_OTHER): Admission: RE | Admit: 2022-04-07 | Payer: Medicaid Other | Source: Ambulatory Visit | Admitting: Radiology

## 2022-04-14 ENCOUNTER — Ambulatory Visit (INDEPENDENT_AMBULATORY_CARE_PROVIDER_SITE_OTHER): Payer: Medicaid Other

## 2022-04-14 DIAGNOSIS — Z7185 Encounter for immunization safety counseling: Secondary | ICD-10-CM

## 2022-04-14 NOTE — Progress Notes (Signed)
The RN precepted case with me. Based on her vaccination record, she is up to date with her Meningitis vaccination. However, I called and discussed with her. Since this is travel medicine, I recommended that she makes an appointment with the [facility] by calling [phone] for travel vaccination review and recommendations. She agreed with the plan.

## 2022-04-14 NOTE — Progress Notes (Signed)
Patient presents to nurse clinic requesting meningitis vaccine for upcoming trip to [place] on 04/17/22. Patient received meningitis vaccine on 04/17/2021. Called CDC to determine if patient needed additional vaccination. Per CDC website and CDC contact, patient should not need additional vaccination. "Certificate of vaccination with the quadrivalent vaccine against meningitis, issued no more than 3 years and no less than 10 days before arrival in [place]." (CDC)  Provided patient with copy of immunization record and advised that she should not need additional vaccination.   Precepted with Dr. Gwendlyn Deutscher. See note.   Talbot Grumbling, RN

## 2022-05-08 ENCOUNTER — Ambulatory Visit (HOSPITAL_BASED_OUTPATIENT_CLINIC_OR_DEPARTMENT_OTHER)
Admission: RE | Admit: 2022-05-08 | Discharge: 2022-05-08 | Disposition: A | Discharge status: Home / Self Care | Payer: Medicaid Other | Source: Ambulatory Visit | Attending: Advanced Practice Midwife | Admitting: Advanced Practice Midwife

## 2022-05-08 DIAGNOSIS — Z1231 Encounter for screening mammogram for malignant neoplasm of breast: Secondary | ICD-10-CM | POA: Diagnosis not present

## 2022-05-15 ENCOUNTER — Encounter: Payer: Self-pay | Admitting: Student

## 2022-05-15 ENCOUNTER — Ambulatory Visit (INDEPENDENT_AMBULATORY_CARE_PROVIDER_SITE_OTHER): Payer: Medicaid Other | Admitting: Student

## 2022-05-15 ENCOUNTER — Other Ambulatory Visit (HOSPITAL_COMMUNITY)
Admission: RE | Admit: 2022-05-15 | Discharge: 2022-05-15 | Disposition: A | Discharge status: Home / Self Care | Payer: Medicaid Other | Source: Ambulatory Visit | Attending: Family Medicine | Admitting: Family Medicine

## 2022-05-15 VITALS — BP 137/83 | HR 64 | Ht 65.0 in | Wt 229.2 lb

## 2022-05-15 DIAGNOSIS — R058 Other specified cough: Secondary | ICD-10-CM

## 2022-05-15 DIAGNOSIS — E0590 Thyrotoxicosis, unspecified without thyrotoxic crisis or storm: Secondary | ICD-10-CM | POA: Diagnosis not present

## 2022-05-15 DIAGNOSIS — R7303 Prediabetes: Secondary | ICD-10-CM

## 2022-05-15 DIAGNOSIS — Z113 Encounter for screening for infections with a predominantly sexual mode of transmission: Secondary | ICD-10-CM | POA: Insufficient documentation

## 2022-05-15 DIAGNOSIS — E785 Hyperlipidemia, unspecified: Secondary | ICD-10-CM | POA: Diagnosis not present

## 2022-05-15 DIAGNOSIS — I10 Essential (primary) hypertension: Secondary | ICD-10-CM | POA: Diagnosis not present

## 2022-05-15 DIAGNOSIS — R7309 Other abnormal glucose: Secondary | ICD-10-CM | POA: Diagnosis not present

## 2022-05-15 DIAGNOSIS — Z13820 Encounter for screening for osteoporosis: Secondary | ICD-10-CM | POA: Diagnosis not present

## 2022-05-15 LAB — POCT GLYCOSYLATED HEMOGLOBIN (HGB A1C): Hemoglobin A1C: 5.8 % — AB (ref 4.0–5.6)

## 2022-05-15 NOTE — Progress Notes (Addendum)
SUBJECTIVE:   Chief compliant/HPI: annual examination  [name] is a 57 y.o. who presents today for an annual exam.   Cough Started a few weeks ago, had gotten ill and had runny nose and fever's for 2-3 days w/ temp as high as 104. Temp are normal now and feeling normal, but still having cough. Takes allergy meds regularly, denies any burnings sensation or bitter taste in mouth, denies any SOB or wheezing. Patient doesn't smoke.  STI testing Patient is sexually active w/ husband and does not use protection. Would like screening for STI today. Denies any symptoms.    OBJECTIVE:   BP 137/83   Pulse 64   Ht 5\' 5"  (1.651 m)   Wt 229 lb 3.2 oz (104 kg)   LMP  (LMP Unknown) Comment: pt states 1.5 years ago   SpO2 98%   BMI 38.14 kg/m   Last period was 8 years ago  Physical Exam Constitutional:      Appearance: Normal appearance.  Cardiovascular:     Rate and Rhythm: Normal rate and regular rhythm.     Pulses: Normal pulses.     Heart sounds: Normal heart sounds. No murmur heard.    No friction rub. No gallop.  Pulmonary:     Effort: Pulmonary effort is normal. No respiratory distress.     Breath sounds: Normal breath sounds. No stridor. No wheezing, rhonchi or rales.  Abdominal:     General: Bowel sounds are normal. There is no distension.     Palpations: Abdomen is soft. There is no mass.     Tenderness: There is no abdominal tenderness.  Neurological:     Mental Status: She is alert.      ASSESSMENT/PLAN:   Prediabetes Patient A1c 5.8 on 05/15/22. Patient reports she eat's a notable amount of sweets, but does exercise regularly. Consoled patient on decreasing sweets and watching out for carbs. Will have patient f/u in 3 months -Diet and exercise -F/u 3 months  Essential hypertension BP at goal today. Patient reports compliance w/ medication.  -BMP -Continue Metop 25 mg, Amlodipine 10 mg   Hyperthyroidism Had radioiodine tx about 10 years ago and it went  away. Hasn't had a problem since. Last TSH was normal. Will check TSH today -TSH  Post-viral cough syndrome Patient complains of persistent cough, following recent febrile illness, w/ runny nose. Patient reports feeling better but has still been having cough. She denies SOB or wheezing/bitter tast or refulx, patient doesn't smoke and is taking allergy meds. This cough is most likely post viral cough. Consoled patient on persistence of cough and using honey prn.  -CTM -Honey w/ tea / teaspoon of honey    Annual Examination  See AVS for age appropriate recommendations  PHQ score not completed, but report's good mood and no depression or low mood.  BP reviewed and is at goal, took meds today.  Asked about intimate partner violence and resources given as appropriate  Advance directives discussed and packet handed out.   Considered the following items based upon USPSTF recommendations: Diabetes screening: discussed and ordered 5.8 - 04/23/21 Screening for elevated cholesterol: discussed and ordered HIV testing: discussed and ordered Neg 04/20/20 Hepatitis C: discussed Neg 03/27/20 Hepatitis B: discussed and ordered for next visit Syphilis if at high risk: discussed and ordered Neg 04/20/20 GC/CT  ordered  Neg 04/20/20 Osteoporosis screening considered based upon risk of fracture from [facility] calculator. DEXA ordered.  Reviewed risk factors for latent tuberculosis and not  indicated   Discussed family history, BRCA testing not indicated.  Cervical cancer screening: prior Pap reviewed, repeat due in 04/20/25  - NILM 04/20/20 Breast cancer screening:  Neg 05/08/22  rpt in 1 year Colorectal cancer screening: up to date on screening for CRC. Next due 01/28/25 Lung cancer screening: discussed and declined. See documentation below regarding indications/risks/benefits. No smoking or recreational drugs Vaccinations Tdap due.   Follow up in 1  year or sooner if indicated.    Bess Kinds, MD [facility]

## 2022-05-15 NOTE — Patient Instructions (Signed)
It was great to see you! Thank you for allowing me to participate in your care!  I recommend that you always bring your medications to each appointment as this makes it easy to ensure we are on the correct medications and helps [place] not miss when refills are needed.  Our plans for today:  - Check Up  Today we are testing your:  Cholesterol  Kidney function and electrolytes  Sexually transmitted infections  HIV & Syphilis  A1c: for Diabetes - Prediabetes We tested your A1c to see if you have diabetes. It tells [place] what your blood sugars have been for the last 3 months. Your blood sugars have been slightly elevated and are in the prediabetic range.  Cut back on sweets  Get 20 min of exercise at least 3 times a week  -Cough Given that the cough started after you were sick, you likely have a "Post Viral Cough" This can go on for weeks but should resolve on it's own  Can try honey/ tea w/ honey to help w/ cough  We are checking some labs today, I will call you if they are abnormal will send you a MyChart message or a letter if they are normal.  If you do not hear about your labs in the next 2 weeks please let us know.  Take care and seek immediate care sooner if you develop any concerns.   Dr. Bess Kinds, MD [facility]

## 2022-05-16 ENCOUNTER — Other Ambulatory Visit: Payer: Self-pay | Admitting: Student

## 2022-05-16 LAB — CERVICOVAGINAL ANCILLARY ONLY
Bacterial Vaginitis (gardnerella): POSITIVE — AB
Candida Glabrata: NEGATIVE
Candida Vaginitis: NEGATIVE
Chlamydia: NEGATIVE
Comment: NEGATIVE
Comment: NEGATIVE
Comment: NEGATIVE
Comment: NEGATIVE
Comment: NEGATIVE
Comment: NORMAL
Neisseria Gonorrhea: NEGATIVE
Trichomonas: NEGATIVE

## 2022-05-16 MED ORDER — METRONIDAZOLE 500 MG PO TABS: 500.0000 mg | ORAL_TABLET | Freq: Two times a day (BID) | ORAL | 0 refills | Status: AC

## 2022-05-21 ENCOUNTER — Ambulatory Visit: Payer: Medicaid Other

## 2022-05-21 LAB — TSH RFX ON ABNORMAL TO FREE T4: TSH: 1.64 u[IU]/mL (ref 0.450–4.500)

## 2022-05-21 LAB — BASIC METABOLIC PANEL
BUN/Creatinine Ratio: 18 (ref 9–23)
BUN: 15 mg/dL (ref 6–24)
CO2: 23 mmol/L (ref 20–29)
Calcium: 9.6 mg/dL (ref 8.7–10.2)
Chloride: 103 mmol/L (ref 96–106)
Creatinine, Ser: 0.83 mg/dL (ref 0.57–1.00)
Glucose: 85 mg/dL (ref 70–99)
Potassium: 4.1 mmol/L (ref 3.5–5.2)
Sodium: 140 mmol/L (ref 134–144)
eGFR: 82 mL/min/{1.73_m2} (ref >59)

## 2022-05-21 LAB — LIPID PANEL
Chol/HDL Ratio: 4 ratio (ref 0.0–4.4)
Cholesterol, Total: 199 mg/dL (ref 100–199)
HDL: 50 mg/dL (ref >39)
LDL Chol Calc (NIH): 134 mg/dL — ABNORMAL HIGH (ref 0–99)
Triglycerides: 80 mg/dL (ref 0–149)
VLDL Cholesterol Cal: 15 mg/dL (ref 5–40)

## 2022-05-21 LAB — RPR W/REFLEX TO TREPSURE: RPR: NONREACTIVE

## 2022-05-21 LAB — HIV ANTIBODY (ROUTINE TESTING W REFLEX): HIV Screen 4th Generation wRfx: NONREACTIVE

## 2022-05-21 LAB — T PALLIDUM ANTIBODY, EIA: T pallidum Antibody, EIA: NEGATIVE

## 2022-05-22 ENCOUNTER — Encounter: Payer: Self-pay | Admitting: Student

## 2022-05-22 ENCOUNTER — Other Ambulatory Visit: Payer: Self-pay | Admitting: Student

## 2022-05-22 MED ORDER — ROSUVASTATIN CALCIUM 10 MG PO TABS: 10.0000 mg | ORAL_TABLET | Freq: Every day | ORAL | 11 refills | Status: DC

## 2022-05-22 NOTE — Progress Notes (Signed)
Patient w/ ASCVD risk on 3.0-5.2 % suggesting starting low intensity statin. Will start low dose crestor

## 2022-05-23 DIAGNOSIS — R7303 Prediabetes: Secondary | ICD-10-CM | POA: Insufficient documentation

## 2022-05-23 DIAGNOSIS — R058 Other specified cough: Secondary | ICD-10-CM | POA: Insufficient documentation

## 2022-05-23 NOTE — Assessment & Plan Note (Signed)
Patient complains of persistent cough, following recent febrile illness, w/ runny nose. Patient reports feeling better but has still been having cough. She denies SOB or wheezing/bitter tast or refulx, patient doesn't smoke and is taking allergy meds. This cough is most likely post viral cough. Consoled patient on persistence of cough and using honey prn.  -CTM -Honey w/ tea / teaspoon of honey

## 2022-05-23 NOTE — Assessment & Plan Note (Signed)
Had radioiodine tx about 10 years ago and it went away. Hasn't had a problem since. Last TSH was normal. Will check TSH today -TSH

## 2022-05-23 NOTE — Assessment & Plan Note (Signed)
BP at goal today. Patient reports compliance w/ medication.  -BMP -Continue Metop 25 mg, Amlodipine 10 mg

## 2022-05-23 NOTE — Assessment & Plan Note (Signed)
Patient A1c 5.8 on 05/15/22. Patient reports she eat's a notable amount of sweets, but does exercise regularly. Consoled patient on decreasing sweets and watching out for carbs. Will have patient f/u in 3 months -Diet and exercise -F/u 3 months

## 2022-06-02 ENCOUNTER — Ambulatory Visit (INDEPENDENT_AMBULATORY_CARE_PROVIDER_SITE_OTHER): Payer: Medicaid Other

## 2022-06-02 DIAGNOSIS — Z23 Encounter for immunization: Secondary | ICD-10-CM

## 2022-06-03 ENCOUNTER — Ambulatory Visit (INDEPENDENT_AMBULATORY_CARE_PROVIDER_SITE_OTHER): Payer: Medicaid Other | Admitting: Family Medicine

## 2022-06-03 ENCOUNTER — Other Ambulatory Visit: Payer: Self-pay

## 2022-06-03 ENCOUNTER — Encounter: Payer: Self-pay | Admitting: Family Medicine

## 2022-06-03 ENCOUNTER — Ambulatory Visit
Admission: RE | Admit: 2022-06-03 | Discharge: 2022-06-03 | Disposition: A | Discharge status: Home / Self Care | Payer: Medicaid Other | Source: Ambulatory Visit | Attending: Family Medicine | Admitting: Family Medicine

## 2022-06-03 VITALS — BP 131/73 | HR 66 | Ht 65.0 in | Wt 224.0 lb

## 2022-06-03 DIAGNOSIS — R053 Chronic cough: Secondary | ICD-10-CM | POA: Insufficient documentation

## 2022-06-03 MED ORDER — FLUTICASONE PROPIONATE 50 MCG/ACT NA SUSP: 2.0000 | Freq: Every day | NASAL | 6 refills | Status: DC

## 2022-06-03 NOTE — Progress Notes (Signed)
Patient presents to nurse clinic for Tdap vaccination. She was seen on 4/25 with Dr. Barbaraann Faster and was told to receive vaccination. Patient denies fever or recent illness within the last 24 hours.   Administered in RD, site unremarkable, tolerated injection well.   Veronda Prude, RN

## 2022-06-03 NOTE — Assessment & Plan Note (Addendum)
Cough present for at least 8 weeks. Given persistent nature and age, will check CXR. Suspect symptoms are secondary to allergies and upper airway cough syndrome. Saw asthma/allergy specialist in the past. Continue Zyrtec daily. Will add Flonase. Patient refused nasal azelastine or atrovent as these made her nose dry. If no improvement, could also consider trial of PPI

## 2022-06-03 NOTE — Progress Notes (Signed)
    SUBJECTIVE:   CHIEF COMPLAINT / HPI:   Cough -present for at least 2 months -maybe getting slightly worse -worse at night -tried Mucinex and another OTC cough medication without improvement -endorses nasal congestion as well -no fever, no chest pain, no GI symptoms or bad taste in mouth -denies shortness of breath -dose endorse throat pain w/coughing -taking zyrtec daily -previously treated for allergies as well as GERD---didn't like nasal azelastine or nasal atrovent as it made her nose dry. Didn't like pepcid either.  PERTINENT  PMH / PSH: HTN  OBJECTIVE:   BP 131/73   Pulse 66   Ht 5\' 5"  (1.651 m)   Wt 224 lb (101.6 kg)   LMP  (LMP Unknown) Comment: pt states 1.5 years ago   SpO2 99%   BMI 37.28 kg/m   Gen: alert, well-appearing, NAD Head: [place]/AT Eyes: normal sclera and conjunctiva, PERRL Ears: external ears, canals, and TMs normal bilaterally Nose: nares patent, no significant turbinate hypertrophy Throat: oropharynx unremarkable without tonsillar edema, erythema or exudate Neck: no cervical or supraclavicular lymphadenopathy CV: RRR, normal S1/S2 without m/r/g Resp: normal effort, lungs CTAB  Skin: no rashes on exposed skin Neuro: grossly intact  ASSESSMENT/PLAN:   Chronic cough Cough present for at least 8 weeks. Given persistent nature and age, will check CXR. Suspect symptoms are secondary to allergies and upper airway cough syndrome. Saw asthma/allergy specialist in the past. Continue Zyrtec daily. Will add Flonase. Patient refused nasal azelastine or atrovent as these made her nose dry. If no improvement, could also consider trial of PPI     Maury Dus, MD [facility]

## 2022-06-03 NOTE — Patient Instructions (Addendum)
It was great to meet you!  I'm sorry you're experiencing a persistent cough.  I have ordered a chest x-ray. You can go to [facility] ([address]) any time between 7am-7pm to have this done. No appointment needed.   I suspect your symptoms are related to allergies.  Continue your Zyrtec once daily.  I have also sent Flonase nasal spray to your pharmacy.  Let us know if you do not have improvement. We could also consider trying a different medication for reflux in the future.   Take care, Dr. Anner Crete

## 2022-08-01 ENCOUNTER — Ambulatory Visit: Payer: Medicaid Other | Admitting: Student

## 2022-08-01 VITALS — BP 122/72 | HR 61 | Ht 65.0 in | Wt 233.6 lb

## 2022-08-01 DIAGNOSIS — R1032 Left lower quadrant pain: Secondary | ICD-10-CM | POA: Diagnosis not present

## 2022-08-01 DIAGNOSIS — R300 Dysuria: Secondary | ICD-10-CM | POA: Diagnosis not present

## 2022-08-01 LAB — POCT URINALYSIS DIP (CLINITEK)
Bilirubin, UA: NEGATIVE
Blood, UA: NEGATIVE
Glucose, UA: NEGATIVE mg/dL
Ketones, POC UA: NEGATIVE mg/dL
Leukocytes, UA: NEGATIVE
Nitrite, UA: NEGATIVE
POC PROTEIN,UA: NEGATIVE
Spec Grav, UA: 1.02 (ref 1.010–1.025)
Urobilinogen, UA: 0.2 E.U./dL
pH, UA: 6.5 (ref 5.0–8.0)

## 2022-08-01 NOTE — Patient Instructions (Signed)
It was great to see you! Thank you for allowing me to participate in your care!   I recommend that you always bring your medications to each appointment as this makes it easy to ensure we are on the correct medications and helps [place] not miss when refills are needed.  Our plans for today:  -You may take Tylenol 1000 mg every 4-6 hours as needed for pain -If you notice hard stools, or decreased bowel movements from your normal I recommend over-the-counter MiraLAX to assist with constipation -If symptoms do not improve in the next 1 to 2 weeks, please call and make appointment -If you develop fevers, nausea and vomiting, bloody bowel movements, severe pain I recommend you seek immediate medical care   Take care and seek immediate care sooner if you develop any concerns. Please remember to show up 15 minutes before your scheduled appointment time!  Tiffany Kocher, DO [facility]

## 2022-08-01 NOTE — Progress Notes (Signed)
    SUBJECTIVE:   CHIEF COMPLAINT / HPI:   LLQ abdominal pain Started a week ago. No recent trauma. No rashes. No bleeding. No vomiting. Low grade fever on first day now resolved. Hx of 1 c-section. Normal bowel movements. Never had this pain before. Intermittent but regular. Eating does not make it worse. Recent colonoscopy showed benign polyps, few hemorrhoids and no visualized diverticula.  Some dysuria, denies hematuria, no hx of kidney stones. Has both of her ovaries. Menopausal, no vaginal bleeding. No significant GU history.  OBJECTIVE:   BP 122/72   Pulse 61   Ht 5\' 5"  (1.651 m)   Wt 233 lb 9.6 oz (106 kg)   LMP  (LMP Unknown) Comment: pt states 1.5 years ago   SpO2 96%   BMI 38.87 kg/m    General: NAD, pleasant Cardio: RRR, no MRG. Cap Refill <2s. Respiratory: CTAB, normal wob on RA GI: Abdomen is soft, not distended. BS present. Tenderness to deep palpation of LLQ and suprapubic region.  No rebound tenderness, no guarding.  No rash visible on skin. Skin: Warm and dry  Point-of-care urinalysis: Unremarkable  ASSESSMENT/PLAN:   LLQ abdominal pain Based on acuity and symptoms above, benign colonoscopy in January, UA WNL, and benign physical exam today I have very low concern for acute diverticulitis, perforation, obstructions, aneurysm, UTI, ureterolithiasis, and gynecologic disorders.  The differential includes acute viral colitis v.  Strain of abdominal muscles. - Conservative management - Return and ED precautions discussed - Follow-up in 2 weeks if not improved  Tiffany Kocher, DO [facility]

## 2022-08-08 ENCOUNTER — Other Ambulatory Visit: Payer: Self-pay | Admitting: Family Medicine

## 2022-08-12 ENCOUNTER — Other Ambulatory Visit: Payer: Self-pay

## 2022-09-16 ENCOUNTER — Other Ambulatory Visit: Payer: Self-pay

## 2022-09-16 DIAGNOSIS — I10 Essential (primary) hypertension: Secondary | ICD-10-CM

## 2022-09-16 MED ORDER — AMLODIPINE BESYLATE 10 MG PO TABS: ORAL_TABLET | ORAL | 1 refills | Status: DC

## 2022-10-02 ENCOUNTER — Other Ambulatory Visit: Payer: Self-pay | Admitting: *Deleted

## 2022-10-02 DIAGNOSIS — I10 Essential (primary) hypertension: Secondary | ICD-10-CM

## 2022-10-02 MED ORDER — METOPROLOL TARTRATE 25 MG PO TABS: 25.0000 mg | ORAL_TABLET | Freq: Two times a day (BID) | ORAL | 3 refills | Status: DC

## 2022-10-30 ENCOUNTER — Encounter: Payer: Self-pay | Admitting: Family Medicine

## 2022-10-30 ENCOUNTER — Ambulatory Visit: Payer: Self-pay | Admitting: Family Medicine

## 2022-10-30 ENCOUNTER — Ambulatory Visit (HOSPITAL_COMMUNITY)
Admission: RE | Admit: 2022-10-30 | Discharge: 2022-10-30 | Disposition: A | Discharge status: Home / Self Care | Payer: Medicaid Other | Source: Ambulatory Visit | Attending: Family Medicine | Admitting: Family Medicine

## 2022-10-30 VITALS — BP 157/70 | HR 66 | Ht 65.0 in | Wt 239.5 lb

## 2022-10-30 DIAGNOSIS — R0609 Other forms of dyspnea: Secondary | ICD-10-CM | POA: Diagnosis not present

## 2022-10-30 DIAGNOSIS — Z23 Encounter for immunization: Secondary | ICD-10-CM | POA: Diagnosis not present

## 2022-10-30 DIAGNOSIS — R3589 Other polyuria: Secondary | ICD-10-CM

## 2022-10-30 DIAGNOSIS — I5030 Unspecified diastolic (congestive) heart failure: Secondary | ICD-10-CM

## 2022-10-30 LAB — POCT GLYCOSYLATED HEMOGLOBIN (HGB A1C): HbA1c, POC (prediabetic range): 5.9 % (ref 5.7–6.4)

## 2022-10-30 MED ORDER — FUROSEMIDE 20 MG PO TABS
20.0000 mg | ORAL_TABLET | Freq: Every day | ORAL | 0 refills | Status: DC
Start: 2022-10-30 — End: 2023-06-09

## 2022-10-30 NOTE — Progress Notes (Signed)
    SUBJECTIVE:   CHIEF COMPLAINT / HPI:   SOB Has had it for about a year but has worsened over the last 3 months. Patient says going from chair to door patient feels like she has to take a break. Only feels the SOB with exertion. Does not feel it sitting. Worst with stairs. No chest pain. A year ago was walking normally. Denies orthopnea. Notices legs have been more swollen and says the backs of her legs get very hard. Has gained 10 pounds in 6 months. Patient says that she holds on to fluid a lot. Has been urinating 4-5 times overnight. Denies leaking during the day. Takes amlodipine and metoprolol every day. Last took it this morning.   Denies blood per rectum. Colonoscopy was normal in January. Patient is menopausal without any post menopausal bleeding.   Denies palpitations or family history of arrhythmia.   Polyuria  Patient says that every night she has to get up to urinate about 4-5 times a night. Says last night she went to urinate almost every 30 minutes. Only at night, no dysuria does not bother her during the day. Patient says she drinks a lot of water but does not feel constantly thirsty. Denies stress incontinence or other incontinence.   PERTINENT  PMH / PSH: G1DD, HTN, Prediabetes   OBJECTIVE:   BP (!) 148/73   Pulse 66   Ht 5\' 5"  (1.651 m)   Wt 239 lb 8 oz (108.6 kg)   LMP  (LMP Unknown) Comment: pt states 1.5 years ago   SpO2 99%   BMI 39.85 kg/m   General: well appearing, in no acute distress CV: RRR, radial pulses equal and palpable, 1+ bilateral lower extremity pitting edema to the knees, no JVD  Resp: Normal work of breathing on room air, diminished breath sounds throughout limited by body habitus, otherwise CTAB Abd: Soft, non tender, non distended  Neuro: Alert & Oriented x 4    ASSESSMENT/PLAN:   Assessment & Plan Dyspnea on exertion Most likely due to progression of her heart failure. Last echo in 2022 showed EF 60-65%. Could have worsened. Unlikely  asthma as patient does not have wheezing, previous pfts unremarkable. Also unlikely arhythmia. EKG unremarkable. Possibly slightly prolonged pr interval but consistent pr interval, so unlikely cause of symptoms. Unlikely anemia.  - ECHO - CMP, BNP - trial lasix 20 mg daily for 5 days (informed patient to take in morning)  - Follow up with PCP next week  Polyuria Unclear what cause is as patient is only having symptoms at night. Only prediabetic previously. History not concerning for incontinence.  - Hgb A1c  - microalbumin/creatinine ratio  - UA      Lockie Mola, MD [facility]

## 2022-10-30 NOTE — Patient Instructions (Addendum)
It was wonderful to see you today.  Please bring ALL of your medications with you to every visit.   Today we talked about:  Shortness of breath - I feel this may be related to your heart. I have ordered a number of tests. A nurse will call you to schedule the ultrasound of your heart after it gets approval from insurance. I have prescribed lasix, a medicine to get rid of some of the fluid for 5 days. We will follow up next week to see how the shortness of breath is doing.  Your EKG looked non concerning for your cause of shortness of breath.   Urinating at night - I have gotten some tests to see if we can get an idea of why this is happening. I will let you know the results.   Thank you for choosing [facility].   Please call [phone] with any questions about today's appointment.  Please be sure to schedule follow up at the front desk before you leave today.   Lockie Mola, MD  Family Medicine

## 2022-11-01 LAB — CBC WITH DIFFERENTIAL/PLATELET
Basophils Absolute: 0 10*3/uL (ref 0.0–0.2)
Basos: 0 %
EOS (ABSOLUTE): 0.2 10*3/uL (ref 0.0–0.4)
Eos: 3 %
Hematocrit: 40.2 % (ref 34.0–46.6)
Hemoglobin: 12.5 g/dL (ref 11.1–15.9)
Immature Grans (Abs): 0 10*3/uL (ref 0.0–0.1)
Immature Granulocytes: 0 %
Lymphocytes Absolute: 2.8 10*3/uL (ref 0.7–3.1)
Lymphs: 45 %
MCH: 26.2 pg — ABNORMAL LOW (ref 26.6–33.0)
MCHC: 31.1 g/dL — ABNORMAL LOW (ref 31.5–35.7)
MCV: 84 fL (ref 79–97)
Monocytes Absolute: 0.6 10*3/uL (ref 0.1–0.9)
Monocytes: 10 %
Neutrophils Absolute: 2.5 10*3/uL (ref 1.4–7.0)
Neutrophils: 42 %
Platelets: 319 10*3/uL (ref 150–450)
RBC: 4.78 x10E6/uL (ref 3.77–5.28)
RDW: 13.5 % (ref 11.7–15.4)
WBC: 6.1 10*3/uL (ref 3.4–10.8)

## 2022-11-01 LAB — MICROALBUMIN / CREATININE URINE RATIO
Creatinine, Urine: 82.9 mg/dL
Microalb/Creat Ratio: 4 mg/g{creat} (ref 0–29)
Microalbumin, Urine: 3.4 ug/mL

## 2022-11-01 LAB — COMPREHENSIVE METABOLIC PANEL
ALT: 25 [IU]/L (ref 0–32)
AST: 23 [IU]/L (ref 0–40)
Albumin: 4.6 g/dL (ref 3.8–4.9)
Alkaline Phosphatase: 70 [IU]/L (ref 44–121)
BUN/Creatinine Ratio: 16 (ref 9–23)
BUN: 13 mg/dL (ref 6–24)
Bilirubin Total: <0.2 mg/dL (ref 0.0–1.2)
CO2: 22 mmol/L (ref 20–29)
Calcium: 9.8 mg/dL (ref 8.7–10.2)
Chloride: 102 mmol/L (ref 96–106)
Creatinine, Ser: 0.8 mg/dL (ref 0.57–1.00)
Globulin, Total: 2.8 g/dL (ref 1.5–4.5)
Glucose: 93 mg/dL (ref 70–99)
Potassium: 4 mmol/L (ref 3.5–5.2)
Sodium: 143 mmol/L (ref 134–144)
Total Protein: 7.4 g/dL (ref 6.0–8.5)
eGFR: 85 mL/min/{1.73_m2} (ref >59)

## 2022-11-01 LAB — URINALYSIS, ROUTINE W REFLEX MICROSCOPIC
Bilirubin, UA: NEGATIVE
Glucose, UA: NEGATIVE
Ketones, UA: NEGATIVE
Leukocytes,UA: NEGATIVE
Nitrite, UA: NEGATIVE
Protein,UA: NEGATIVE
RBC, UA: NEGATIVE
Specific Gravity, UA: 1.017 (ref 1.005–1.030)
Urobilinogen, Ur: 0.2 mg/dL (ref 0.2–1.0)
pH, UA: 7 (ref 5.0–7.5)

## 2022-11-01 LAB — BRAIN NATRIURETIC PEPTIDE: BNP: 4.3 pg/mL (ref 0.0–100.0)

## 2022-11-05 ENCOUNTER — Encounter: Payer: Self-pay | Admitting: Student

## 2022-11-05 ENCOUNTER — Other Ambulatory Visit: Payer: Self-pay

## 2022-11-05 ENCOUNTER — Ambulatory Visit (INDEPENDENT_AMBULATORY_CARE_PROVIDER_SITE_OTHER): Payer: Medicaid Other | Admitting: Student

## 2022-11-05 VITALS — BP 137/76 | HR 73 | Ht 65.0 in | Wt 239.0 lb

## 2022-11-05 DIAGNOSIS — R0609 Other forms of dyspnea: Secondary | ICD-10-CM | POA: Diagnosis not present

## 2022-11-05 NOTE — Progress Notes (Signed)
  SUBJECTIVE:   CHIEF COMPLAINT / HPI:   DOE -Seen 10/10 for DOE going on for a year, w/ leg swelling and wt gain > Tx lasix 20 mg x 5 days -Pending echo, last echo 2022 was normal  Today: Note's that dispnea was better after the lasix, after a couple of days. Still having it a little bit. Patient report's she's been doing Pure Bar regularly for the last 2 years and is okay w/ this activity. She notices it when she is walking short distances or going up stairs.   Polyuria -Seen 10/10 for freq urination at night, normal UA, A1c 5.9  PERTINENT  PMH / PSH:    OBJECTIVE:  BP 137/76   Pulse 73   Ht 5\' 5"  (1.651 m)   Wt 239 lb (108.4 kg)   LMP  (LMP Unknown) Comment: pt states 1.5 years ago   SpO2 99%   BMI 39.77 kg/m  Physical Exam Constitutional:      General: She is not in acute distress.    Appearance: Normal appearance. She is not ill-appearing.  Cardiovascular:     Rate and Rhythm: Normal rate and regular rhythm.     Pulses: Normal pulses.     Heart sounds: Normal heart sounds. No murmur heard.    No friction rub. No gallop.  Pulmonary:     Effort: Pulmonary effort is normal. No respiratory distress.     Breath sounds: Normal breath sounds. No stridor. No wheezing, rhonchi or rales.  Neurological:     Mental Status: She is alert.      ASSESSMENT/PLAN:  Dyspnea on exertion Assessment & Plan: Patient comes in for follow-up of dyspnea on exertion.  Patient appreciates that her dyspnea has gotten a little bit better since taking the Lasix.  Patient's labs were all negative, BNP did not show any volume overload, CBC did not show anemia, normal renal function, patient last echo was normal, etiology of this undetermined at this time, but low concern for cardiac/renal etiology.  Will send patient for pulmonary function testing to evaluate for restrictive lung disease.  Provider suspects there might be a component of exercise tolerance playing a role, as patient notes she can do  pure barre exercise without dyspnea on exertion.  Will have patient increase activity level steadily, to see if improves breathing. - Pulmonary function testing with Dr. Hildred Laser - Walking multiple times a week, to monitor dyspnea - Patient to obtain echo - BMP to evaluate for hypokalemia following Lasix use  Orders: -     Basic metabolic panel   No follow-ups on file. Bess Kinds, MD 11/05/2022, 9:41 AM PGY-3, [facility]

## 2022-11-05 NOTE — Patient Instructions (Addendum)
It was great to see you! Thank you for allowing me to participate in your care!  I recommend that you always bring your medications to each appointment as this makes it easy to ensure we are on the correct medications and helps [place] not miss when refills are needed.  Our plans for today:  - Shortness of Breath Schedule an appointment with Dr. Madelon Lips for "Lung Function Testing" (PFT)  Wait for echo to be scheduled, If you haven't heard back in 1 week, call and ask about the echo7  Try walking regularly to see how it affects your breathing (Walk for 10 min one direction, turn around an walk back). Give it a couple weeks to see if it get's better.  Continue to monitor shortness of breath and let me know if it get's worse.    We are checking some labs today, I will call you if they are abnormal will send you a MyChart message or a letter if they are normal.  If you do not hear about your labs in the next 2 weeks please let us know.  Take care and seek immediate care sooner if you develop any concerns.   Dr. Bess Kinds, MD [facility]

## 2022-11-05 NOTE — Assessment & Plan Note (Addendum)
Patient comes in for follow-up of dyspnea on exertion.  Patient appreciates that her dyspnea has gotten a little bit better since taking the Lasix.  Patient's labs were all negative, BNP did not show any volume overload, CBC did not show anemia, normal renal function, patient last echo was normal, etiology of this undetermined at this time, but low concern for cardiac/renal etiology.  Will send patient for pulmonary function testing to evaluate for restrictive lung disease.  Provider suspects there might be a component of exercise tolerance playing a role, as patient notes she can do pure barre exercise without dyspnea on exertion.  Will have patient increase activity level steadily, to see if improves breathing. - Pulmonary function testing with Dr. Hildred Laser - Walking multiple times a week, to monitor dyspnea - Patient to obtain echo - BMP to evaluate for hypokalemia following Lasix use

## 2022-11-06 ENCOUNTER — Encounter: Payer: Self-pay | Admitting: Student

## 2022-11-06 LAB — BASIC METABOLIC PANEL
BUN/Creatinine Ratio: 13 (ref 9–23)
BUN: 11 mg/dL (ref 6–24)
CO2: 24 mmol/L (ref 20–29)
Calcium: 9.6 mg/dL (ref 8.7–10.2)
Chloride: 104 mmol/L (ref 96–106)
Creatinine, Ser: 0.83 mg/dL (ref 0.57–1.00)
Glucose: 116 mg/dL — ABNORMAL HIGH (ref 70–99)
Potassium: 4.4 mmol/L (ref 3.5–5.2)
Sodium: 144 mmol/L (ref 134–144)
eGFR: 82 mL/min/{1.73_m2} (ref >59)

## 2022-11-09 ENCOUNTER — Other Ambulatory Visit: Payer: Self-pay | Admitting: Student

## 2022-11-13 ENCOUNTER — Ambulatory Visit (INDEPENDENT_AMBULATORY_CARE_PROVIDER_SITE_OTHER): Payer: Medicaid Other | Admitting: Pharmacist

## 2022-11-13 ENCOUNTER — Encounter: Payer: Self-pay | Admitting: Pharmacist

## 2022-11-13 VITALS — BP 139/68 | HR 70 | Ht 65.0 in | Wt 242.6 lb

## 2022-11-13 DIAGNOSIS — R0609 Other forms of dyspnea: Secondary | ICD-10-CM

## 2022-11-13 NOTE — Progress Notes (Signed)
   S:       Chief Complaint  Patient presents with   Medication Management    PFTs   57 y.o. female who presents for pulmonary function tests and management. Patient arrives in good spirits and presents without any assistance. Has SOB/wheezing after short walk or going up 1 flight of stairs, but does not report SOB more than others in her exercise classes. Reports that walking out to her car from the office would cause her to be SOB.    Patient was referred and last seen by Primary Care Provider, Dr. Barbaraann Faster, on 11/05/2022.   PMH is significant for HTN, HLD.  At last visit, complained of dyspnea on exertion and had a 5 day course of 20 mg Laxis (furosemide), that she reported it kind of helped with breathing. Referred for PFTs.   Medication adherence reported good No COPD/exacerbation medications  Exercise habits: Pure Barre 50 min classes 7 days a week at 07:15  O: Review of Systems  All other systems reviewed and are negative.   Physical Exam Vitals reviewed.  Constitutional:      Appearance: Normal appearance.  Pulmonary:     Effort: Pulmonary effort is normal.  Neurological:     Mental Status: She is alert.  Psychiatric:        Mood and Affect: Mood normal.        Behavior: Behavior normal.        Thought Content: Thought content normal.        Judgment: Judgment normal.    Vitals:   11/13/22 1626  BP: 139/68  Pulse: 70  SpO2: 99%    See "scanned report" or Documentation Flowsheet (discrete results - PFTs) for Spirometry results. Patient provided good effort while attempting spirometry.   Lung Age = 70  Patient is participating in a Managed Medicaid Plan:  Yes  A/P: Patient has been experiencing gradually worsening SOB and dyspnea on exertion for ~1 year and taking no medications for this. Spirometry evaluation with reveals normal lung function.  - No medications started or changed -Reviewed results of pulmonary function tests.  Pt verbalized understanding  of results and education.   Patient pending ECHO work-up, assisted with scheduling/referral as patient not yet scheduled.   Written patient instructions provided.   Total time in face to face counseling 36 minutes.    Follow-up:  Pharmacist PRN PCP clinic visit PRN - waiting for follow up call for ECHO Patient seen with Shona Simpson, PharmD Candidate.

## 2022-11-13 NOTE — Patient Instructions (Signed)
It was nice to see you today!  Your lung test indicates normal lung function  Medication Changes:  Continue all medications the same.

## 2022-11-14 NOTE — Progress Notes (Signed)
Reviewed and agree with Dr Koval's plan.   

## 2022-11-14 NOTE — Assessment & Plan Note (Signed)
Patient has been experiencing gradually worsening SOB and dyspnea on exertion for ~1 year and taking no medications for this. Spirometry evaluation with reveals normal lung function.  - No medications started or changed -Reviewed results of pulmonary function tests.  Pt verbalized understanding of results and education.

## 2022-11-21 ENCOUNTER — Ambulatory Visit (HOSPITAL_COMMUNITY)
Admission: RE | Admit: 2022-11-21 | Discharge: 2022-11-21 | Disposition: A | Discharge status: Home / Self Care | Payer: Medicaid Other | Source: Ambulatory Visit | Attending: Family Medicine | Admitting: Family Medicine

## 2022-11-21 DIAGNOSIS — R0609 Other forms of dyspnea: Secondary | ICD-10-CM

## 2022-11-21 DIAGNOSIS — I5030 Unspecified diastolic (congestive) heart failure: Secondary | ICD-10-CM | POA: Diagnosis not present

## 2022-11-21 DIAGNOSIS — I119 Hypertensive heart disease without heart failure: Secondary | ICD-10-CM | POA: Insufficient documentation

## 2022-11-21 DIAGNOSIS — R0600 Dyspnea, unspecified: Secondary | ICD-10-CM | POA: Diagnosis present

## 2022-11-21 DIAGNOSIS — I350 Nonrheumatic aortic (valve) stenosis: Secondary | ICD-10-CM | POA: Insufficient documentation

## 2022-11-21 LAB — ECHOCARDIOGRAM COMPLETE
AR max vel: 2.29 cm2
AV Area VTI: 2.49 cm2
AV Area mean vel: 2.2 cm2
AV Mean grad: 10 mm[Hg]
AV Peak grad: 17 mm[Hg]
Ao pk vel: 2.06 m/s
Area-P 1/2: 3.6 cm2
Calc EF: 71.2 %
S' Lateral: 3.3 cm
Single Plane A2C EF: 77.7 %
Single Plane A4C EF: 61.3 %

## 2022-11-29 IMAGING — MG MM DIGITAL SCREENING BILAT W/ TOMO AND CAD
8 series · 8 of 24 positions shown · non-contrast
Comparison: Previous exam(s).

CLINICAL DATA: Screening.

EXAM:
DIGITAL SCREENING BILATERAL MAMMOGRAM WITH TOMOSYNTHESIS AND CAD
TECHNIQUE: Bilateral screening digital craniocaudal and mediolateral oblique
mammograms were obtained. Bilateral screening digital breast
tomosynthesis was performed. The images were evaluated with
computer-aided detection.

[L CC synth-2D]
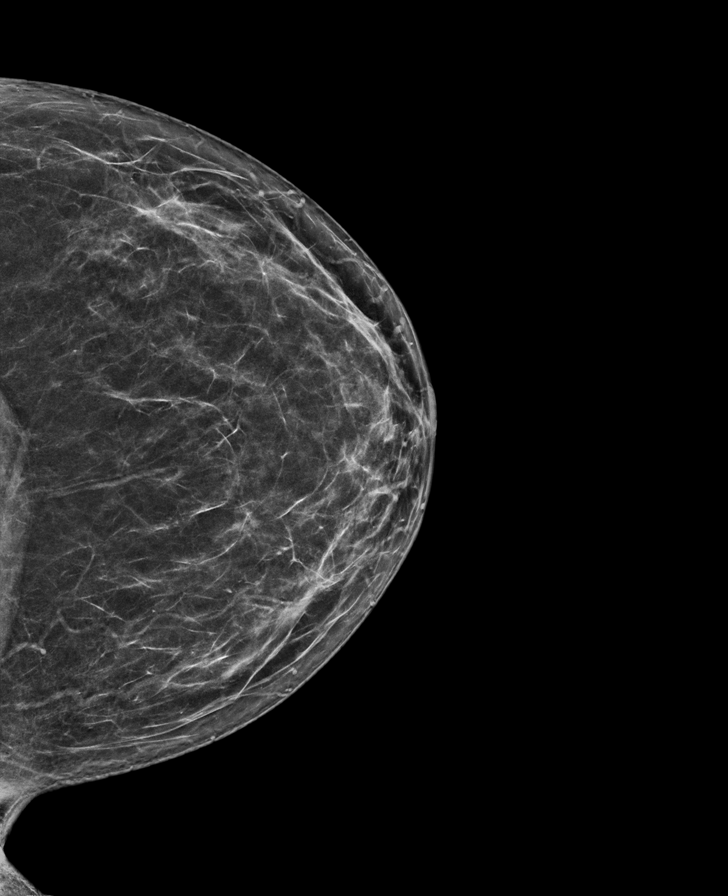

[R CC synth-2D]
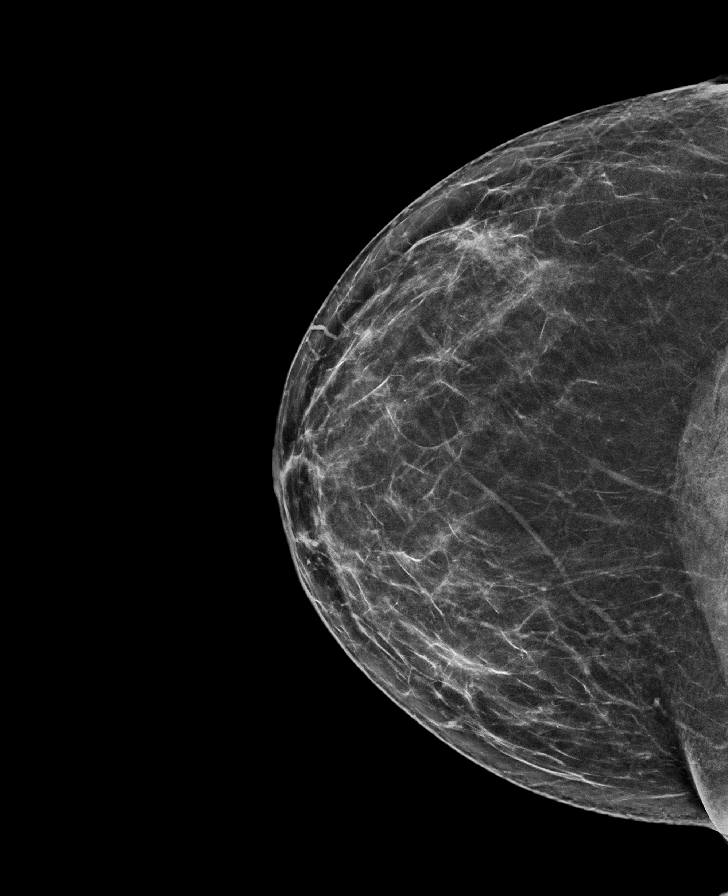

[L MLO synth-2D]
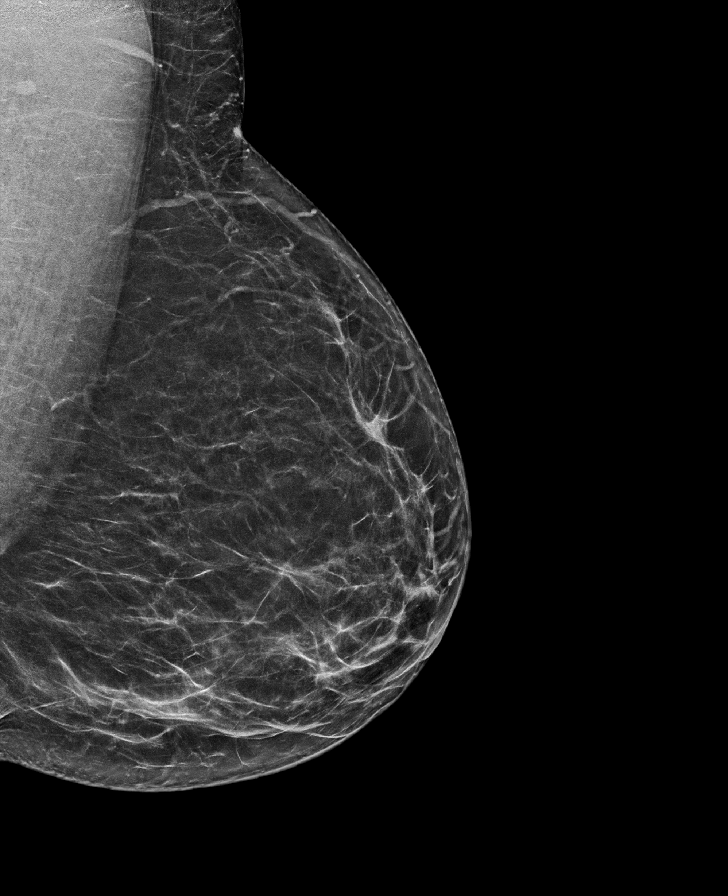

[R MLO synth-2D]
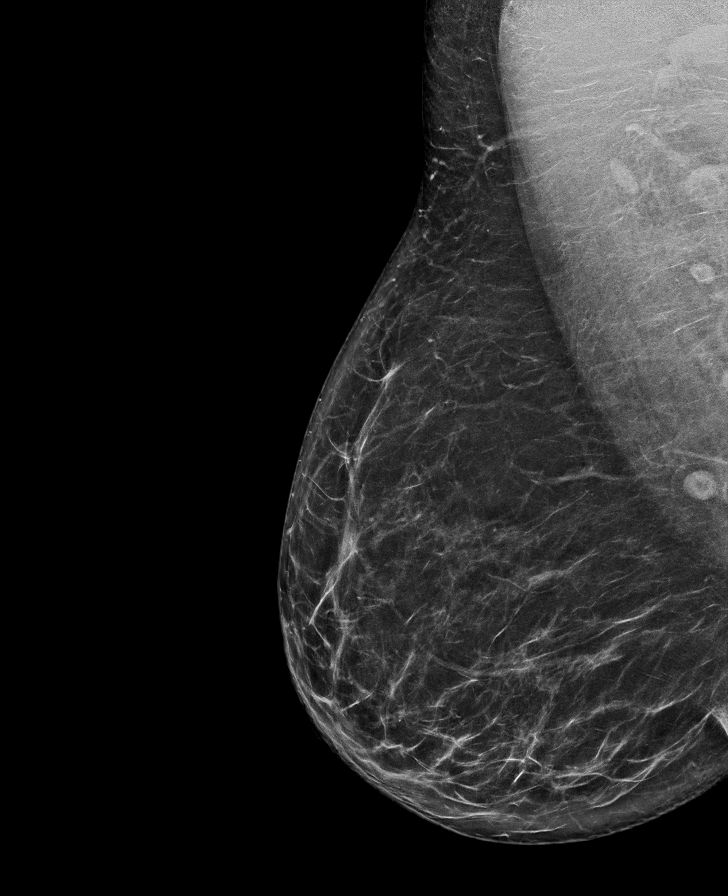

[R CC tomo · tomo slice 35/70.0]
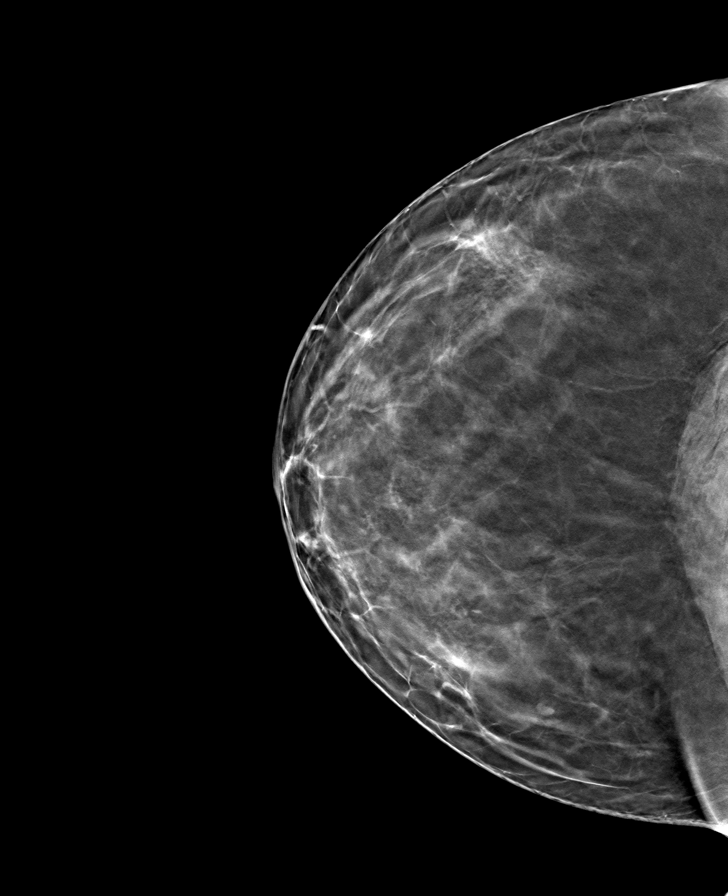

[L MLO tomo · tomo slice 38/75.0]
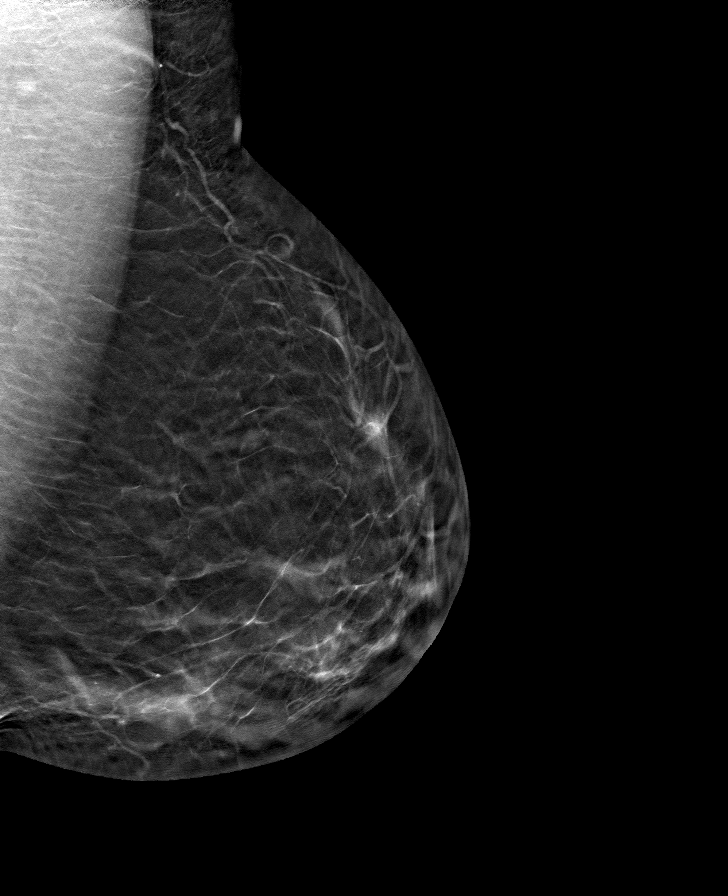

[R MLO tomo · tomo slice 43/84.0]
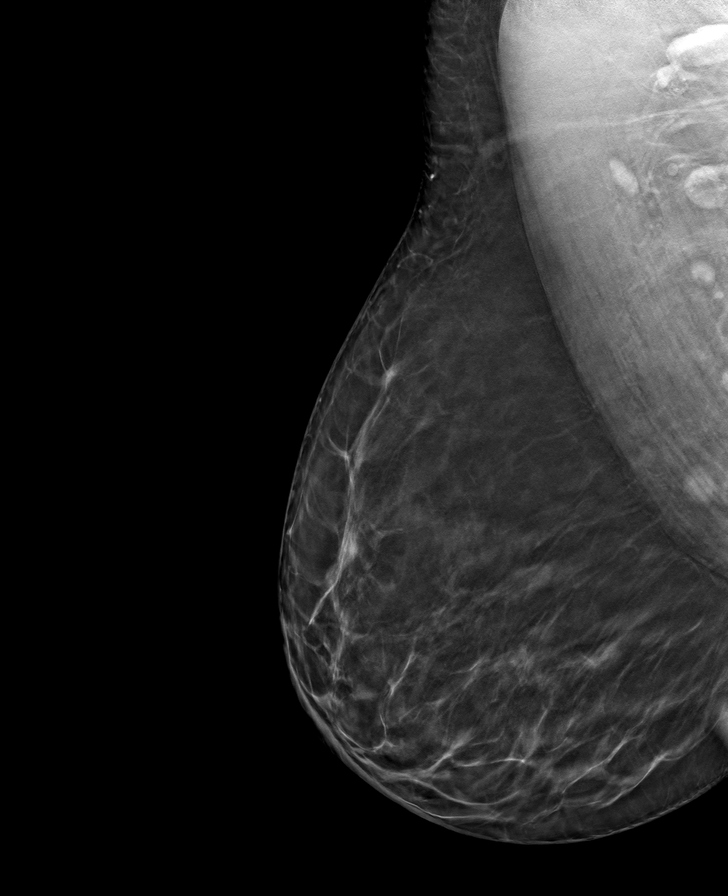

[L CC tomo · tomo slice 35/69.0]
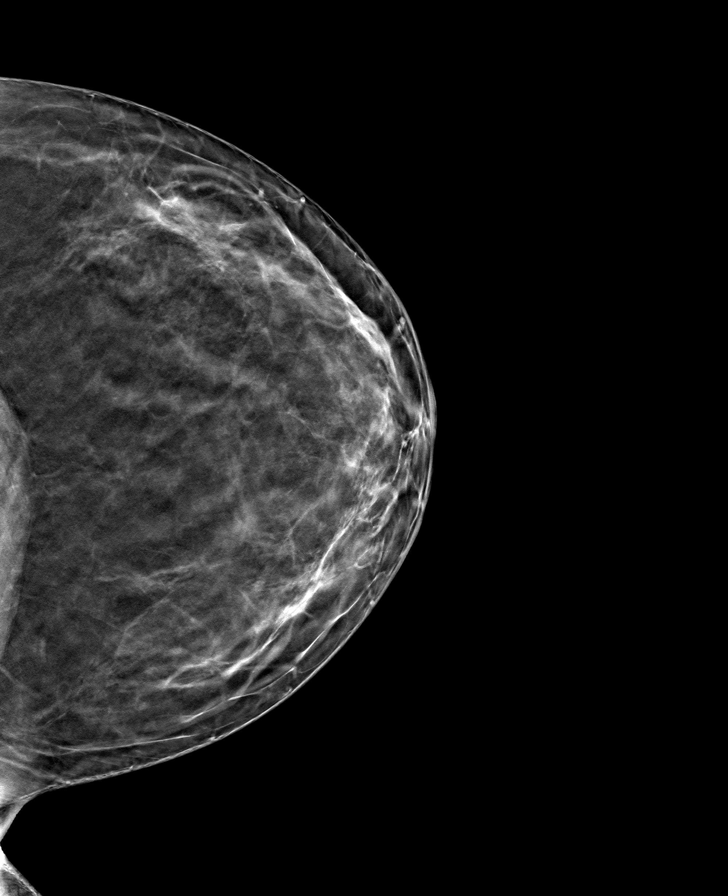

[8 of 24 positions shown; findings below may reference images not displayed]

ACR Breast Density Category b: There are scattered areas of
fibroglandular density.
FINDINGS: There are no findings suspicious for malignancy. The images were
evaluated with computer-aided detection.
IMPRESSION: No mammographic evidence of malignancy. A result letter of this
screening mammogram will be mailed directly to the patient.

RECOMMENDATION:
Screening mammogram in one year. (Code:WJ-I-BG6)

BI-RADS CATEGORY  1: Negative.

## 2022-12-26 ENCOUNTER — Other Ambulatory Visit: Payer: Self-pay | Admitting: Student

## 2022-12-26 DIAGNOSIS — I10 Essential (primary) hypertension: Secondary | ICD-10-CM

## 2023-03-16 ENCOUNTER — Ambulatory Visit (INDEPENDENT_AMBULATORY_CARE_PROVIDER_SITE_OTHER): Payer: Medicaid Other | Admitting: Student

## 2023-03-16 ENCOUNTER — Encounter: Payer: Self-pay | Admitting: Student

## 2023-03-16 VITALS — BP 138/74 | HR 77 | Ht 65.0 in | Wt 245.2 lb

## 2023-03-16 DIAGNOSIS — R0609 Other forms of dyspnea: Secondary | ICD-10-CM

## 2023-03-16 MED ORDER — ALBUTEROL SULFATE HFA 108 (90 BASE) MCG/ACT IN AERS
2.0000 | INHALATION_SPRAY | Freq: Four times a day (QID) | RESPIRATORY_TRACT | 2 refills | Status: AC | PRN
Start: 2023-03-16 — End: ?

## 2023-03-16 NOTE — Patient Instructions (Signed)
 It was great to see you! Thank you for allowing me to participate in your care!   Our plans for today:  - Referring you to pulmonology for further workup - call us if you haven't heard back in next 2 weeks - Starting albuterol as needed for shortness of breath/wheezing   Take care and seek immediate care sooner if you develop any concerns.  Levin Erp, MD

## 2023-03-16 NOTE — Progress Notes (Signed)
    SUBJECTIVE:   CHIEF COMPLAINT / HPI: Wheezing/DOE  DOE for past year per chart review. The symptoms have worsened over the past month and a half, with the addition of audible wheezing. She denies any recent illness but reports a cough about a month ago. The shortness of breath is particularly noticeable when climbing stairs or walking quickly, sometimes necessitating rest to catch her breath. She denies any chest pain associated with these episodes. The wheezing is audible to others, even when the patient is at rest. She denies any history of smoking or known lung conditions. She also reports some swelling in her legs but is on amlodipine. She is currently only taking amlodipine and metoprolol for hypertension, and meloxicam for pain. She has previously been prescribed a diuretic for fluid retention but is not currently taking it and states it potentially helped  Spirometry normal 10/2022 Echo normal EF 60-65% Never smoker No hx of asthma  PERTINENT  PMH / PSH: HTN  OBJECTIVE:   BP 138/74   Pulse 77   Ht 5\' 5"  (1.651 m)   Wt 245 lb 4 oz (111.2 kg)   LMP  (LMP Unknown) Comment: pt states 1.5 years ago   SpO2 94%   BMI 40.81 kg/m   General: Well appearing, NAD, awake, alert, responsive to questions Head: Normocephalic atraumatic CV: Regular rate and rhythm no murmurs rubs or gallops Respiratory: Clear to ausculation bilaterally, no wheezes rales or crackles, chest rises symmetrically,  no increased work of breathing Extremities: Moves upper and lower extremities freely, 1+ edema to knees b/l, no calf tenderness  ASSESSMENT/PLAN:   Assessment & Plan Dyspnea on exertion Negative workup thus far. I wonder if there could be some component of vocal cord dysfunction, upper airway dysfunction or underlying lung pathology. -Pulmonology referral -Albuterol PRN trial   Levin Erp, MD [facility]

## 2023-03-16 NOTE — Assessment & Plan Note (Signed)
 Negative workup thus far. I wonder if there could be some component of vocal cord dysfunction, upper airway dysfunction or underlying lung pathology. -Pulmonology referral -Albuterol PRN trial

## 2023-03-26 ENCOUNTER — Encounter (HOSPITAL_BASED_OUTPATIENT_CLINIC_OR_DEPARTMENT_OTHER): Payer: Self-pay | Admitting: Obstetrics & Gynecology

## 2023-03-26 ENCOUNTER — Encounter (HOSPITAL_BASED_OUTPATIENT_CLINIC_OR_DEPARTMENT_OTHER): Payer: Self-pay | Admitting: Student

## 2023-03-26 ENCOUNTER — Other Ambulatory Visit (HOSPITAL_BASED_OUTPATIENT_CLINIC_OR_DEPARTMENT_OTHER): Payer: Self-pay | Admitting: Student

## 2023-03-26 DIAGNOSIS — Z1231 Encounter for screening mammogram for malignant neoplasm of breast: Secondary | ICD-10-CM

## 2023-05-19 ENCOUNTER — Encounter (HOSPITAL_BASED_OUTPATIENT_CLINIC_OR_DEPARTMENT_OTHER): Payer: Self-pay | Admitting: Radiology

## 2023-05-19 ENCOUNTER — Ambulatory Visit (HOSPITAL_BASED_OUTPATIENT_CLINIC_OR_DEPARTMENT_OTHER)
Admission: RE | Admit: 2023-05-19 | Discharge: 2023-05-19 | Disposition: A | Discharge status: Home / Self Care | Source: Ambulatory Visit | Attending: Student | Admitting: Student

## 2023-05-19 DIAGNOSIS — Z1231 Encounter for screening mammogram for malignant neoplasm of breast: Secondary | ICD-10-CM | POA: Insufficient documentation

## 2023-06-09 ENCOUNTER — Ambulatory Visit (INDEPENDENT_AMBULATORY_CARE_PROVIDER_SITE_OTHER): Admitting: Student

## 2023-06-09 ENCOUNTER — Encounter: Payer: Self-pay | Admitting: Student

## 2023-06-09 VITALS — BP 136/68 | HR 68 | Ht 65.0 in | Wt 238.1 lb

## 2023-06-09 DIAGNOSIS — E785 Hyperlipidemia, unspecified: Secondary | ICD-10-CM

## 2023-06-09 DIAGNOSIS — Z0000 Encounter for general adult medical examination without abnormal findings: Secondary | ICD-10-CM | POA: Diagnosis not present

## 2023-06-09 DIAGNOSIS — R7303 Prediabetes: Secondary | ICD-10-CM

## 2023-06-09 DIAGNOSIS — M65341 Trigger finger, right ring finger: Secondary | ICD-10-CM

## 2023-06-09 DIAGNOSIS — G8929 Other chronic pain: Secondary | ICD-10-CM

## 2023-06-09 DIAGNOSIS — M25512 Pain in left shoulder: Secondary | ICD-10-CM

## 2023-06-09 DIAGNOSIS — I10 Essential (primary) hypertension: Secondary | ICD-10-CM | POA: Diagnosis not present

## 2023-06-09 DIAGNOSIS — R350 Frequency of micturition: Secondary | ICD-10-CM

## 2023-06-09 NOTE — Assessment & Plan Note (Signed)
 A1C ordered, encouraged increased activity

## 2023-06-09 NOTE — Assessment & Plan Note (Signed)
 Stable, unchanged, has pulmonary medicine follow up

## 2023-06-09 NOTE — Progress Notes (Signed)
 SUBJECTIVE:   Chief compliant/HPI: annual examination  [name] is a 58 y.o. who presents today for an annual exam.   She reports several concerns.  L shoulder pain The patient is RHD. She reports a history of bilateral shoulder pain, both right and left. Previously followed with sports medicine. Had good response to injection previously. No new trauma. + Painful arc on L and some limits to forward flexion/abduction due to pain. No neck pain. No numbness.   R [employer] Dig[employer] issue Reports pain and stiffness in ring finger of right hand. No trauma. As above, RHD. Has tried nothing for this. No numbness. Does notice weakness due to pain.  Medications Reports statin gave her a headache, now improved.  Chronic cough Follows with Dr. Dione Franks. Reports albuterol  did not help. 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 Has not had any further hemoptysis. Not using albuterol .   Urinary frequency Reports nocturia. This is new onset. Started past 3-6 months. Has tried limiting fluids. Will urinate during the day a few times then 'is up every hour at night'. No incontinence. No dysuria, hematuria.     History tabs reviewed and updated   Review of systems form reviewed and notable for alveolar hemorrhage, thought to be due to HF with preserved EF, 2012   OBJECTIVE:   BP 136/68   Pulse 68   Ht 5\' 5"  (1.651 m)   Wt 238 lb 2 oz (108 kg)   LMP  (LMP Unknown) Comment: pt states 1.5 years ago   SpO2 100%   BMI 39.63 kg/m   HEENT: EOMI. Sclera without injection or icterus. MMM. External auditory canal examined and WNL. TM normal appearance, no erythema or bulging. Neck: Supple.  Cardiac: Regular rate and rhythm. Normal S1/S2.  Lungs: Clear bilaterally to ascultation.  Abdomen: Normoactive bowel sounds. No tenderness to deep or light palpation. No rebound or guarding.   Psych: Pleasant and appropriate  Shoulder exam Normal appearance TTP along anterior GH  joint Reduced forward flexion to 150 degrees on L but able to passively move to 180 degrees  Painful arc on L as compared to right   Bilateral hands No deformity Slightly reduced grip strength R vs L  Able to palpate nodule on R fourth digit palmar surface   2021 L shoulder xray  CLINICAL DATA:  Left shoulder pain and limited range of motion. No known injury.   EXAM: LEFT SHOULDER - 2+ VIEW   COMPARISON:  None.   FINDINGS: There is no fracture or dislocation. Slight calcification in the region of the distal supraspinatus tendon at its insertion on the greater tuberosity of the proximal humerus.   Glenohumeral joint and AC joint appear normal.   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 ASSESSMENT/PLAN:  Assessment & Plan Trigger ring finger of right hand Referral to SM  Urinary frequency Ddx includes diabetes, OSA, less likely diabetes insipidus BMP, A1C, and sleep study ordered She does report she snores Would like home sleep study due to anxiety sleeping elsewhere Annual physical exam As below  Chronic left shoulder pain Suspect OA vs RTC   Has minimal limitation in ROM Referral SM  Discussed limiting NSAIDS  Essential hypertension Tolerating medications, at goal. BMP today  Discussed minimizing Mobic  use  Dyspnea on exertion Stable, unchanged, has pulmonary medicine follow up  Hyperlipidemia, unspecified hyperlipidemia type Reports headache with statin Discussed alternatives Lipid panel ordered   Prediabetes A1C ordered, encouraged increased  activity    Annual Examination  See AVS for age appropriate recommendations  BP reviewed and at goal <140/90.  Asked about intimate partner violence and resources given as appropriate   Considered the following items based upon USPSTF recommendations: Diabetes screening: ordered Screening for elevated cholesterol: ordered UTD on colonoscopy and mammogram  Micheale Schlack Belia Bowl,  MD [facility]

## 2023-06-09 NOTE — Progress Notes (Deleted)
    SUBJECTIVE:   Chief compliant/HPI: annual examination  Sabrina Keough is a 58 y.o. who presents today for an annual exam.    History tabs reviewed and updated ***.   Review of systems form reviewed and notable for ***.   OBJECTIVE:   LMP  (LMP Unknown) Comment: pt states 1.5 years ago   ***  ASSESSMENT/PLAN:  Assessment & Plan    Annual Examination  See AVS for age appropriate recommendations  PHQ score ***, reviewed and discussed.  BP reviewed and at goal ***.  Asked about intimate partner violence and resources given as appropriate  Advance directives discussion ***  Considered the following items based upon USPSTF recommendations: Diabetes screening: discussed and ordered Screening for elevated cholesterol: discussed and ordered HIV testing: {discussed/ordered:14545} Hepatitis C: {discussed/ordered:14545} Hepatitis B: {discussed/ordered:14545} Syphilis if at high risk: {discussed/ordered:14545} GC/CT {GC/CT screening :23818} Osteoporosis screening considered based upon risk of fracture from [facility] calculator. Major osteoporotic fracture risk is 4.5%. DEXA not ordered.  Reviewed risk factors for latent tuberculosis and {not indicated/requested/declined:14582}   Discussed family history, BRCA testing {not indicated/requested/declined:14582}. Tool used to risk stratify was ***.  Cervical cancer screening: prior Pap reviewed, repeat due in 04/20/25 Breast cancer screening: UTD Colorectal cancer screening: up to date on screening for CRC. Lung cancer screening: declined. See documentation below regarding indications/risks/benefits.  Vaccinations ***.   Follow up in 1 *** year or sooner if indicated.  MyChart Activation: {MYCHARTLIST:32522}  Wilhemena Harbour, MD [facility]

## 2023-06-09 NOTE — Assessment & Plan Note (Addendum)
 Tolerating medications, at goal. BMP today  Discussed minimizing Mobic  use

## 2023-06-09 NOTE — Patient Instructions (Signed)
 It was great to see you! Thank you for allowing me to participate in your care!  I recommend that you always bring your medications to each appointment as this makes it easy to ensure we are on the correct medications and helps us  not miss when refills are needed.  Our plans for today:  -Return tomorrow for blood work  We will check blood work for your urine concerns   I have referred you to Sleep medicine and Sports medicine to further evaluate your concern. If you have not received a phone call about this appointment within 3-4 weeks, please call our office back at [phone]. Rosanna Comment coordinates our referrals and can assist you in this.   -   We are checking some labs today, I will call you if they are abnormal will send you a MyChart message or a letter if they are normal.  If you do not hear about your labs in the next 2 weeks please let us  know.  Take care and seek immediate care sooner if you develop any concerns.   Dr. Wilhemena Harbour, MD [facility]

## 2023-06-09 NOTE — Assessment & Plan Note (Signed)
 Reports headache with statin Discussed alternatives Lipid panel ordered

## 2023-06-10 ENCOUNTER — Ambulatory Visit: Payer: Self-pay | Admitting: Family Medicine

## 2023-06-10 ENCOUNTER — Other Ambulatory Visit (INDEPENDENT_AMBULATORY_CARE_PROVIDER_SITE_OTHER)

## 2023-06-10 DIAGNOSIS — Z0000 Encounter for general adult medical examination without abnormal findings: Secondary | ICD-10-CM | POA: Diagnosis not present

## 2023-06-10 LAB — POCT GLYCOSYLATED HEMOGLOBIN (HGB A1C): HbA1c, POC (prediabetic range): 5.9 % (ref 5.7–6.4)

## 2023-06-11 ENCOUNTER — Other Ambulatory Visit: Payer: Self-pay | Admitting: Student

## 2023-06-11 LAB — BASIC METABOLIC PANEL WITH GFR
BUN/Creatinine Ratio: 19 (ref 9–23)
BUN: 17 mg/dL (ref 6–24)
CO2: 23 mmol/L (ref 20–29)
Calcium: 9.5 mg/dL (ref 8.7–10.2)
Chloride: 103 mmol/L (ref 96–106)
Creatinine, Ser: 0.89 mg/dL (ref 0.57–1.00)
Glucose: 109 mg/dL — ABNORMAL HIGH (ref 70–99)
Potassium: 4.3 mmol/L (ref 3.5–5.2)
Sodium: 143 mmol/L (ref 134–144)
eGFR: 75 mL/min/{1.73_m2} (ref >59)

## 2023-06-11 LAB — LIPID PANEL
Chol/HDL Ratio: 3.8 ratio (ref 0.0–4.4)
Cholesterol, Total: 192 mg/dL (ref 100–199)
HDL: 50 mg/dL (ref >39)
LDL Chol Calc (NIH): 128 mg/dL — ABNORMAL HIGH (ref 0–99)
Triglycerides: 76 mg/dL (ref 0–149)
VLDL Cholesterol Cal: 14 mg/dL (ref 5–40)

## 2023-06-11 MED ORDER — ATORVASTATIN CALCIUM 20 MG PO TABS: 20.0000 mg | ORAL_TABLET | Freq: Every day | ORAL | 3 refills | Status: DC

## 2023-06-11 NOTE — Progress Notes (Signed)
 Pt recent blood work shows elevated cholesterol. Will need to restart statin. Hx of allergy to crestor , will start atorvostatin.  Pt called and results discussed  Rx sent to [address]

## 2023-06-18 ENCOUNTER — Encounter: Payer: Self-pay | Admitting: Family Medicine

## 2023-06-18 ENCOUNTER — Ambulatory Visit: Admitting: Family Medicine

## 2023-06-18 VITALS — BP 138/88 | Ht 65.0 in | Wt 235.0 lb

## 2023-06-18 DIAGNOSIS — G8929 Other chronic pain: Secondary | ICD-10-CM

## 2023-06-18 DIAGNOSIS — M65341 Trigger finger, right ring finger: Secondary | ICD-10-CM | POA: Diagnosis not present

## 2023-06-18 DIAGNOSIS — M25512 Pain in left shoulder: Secondary | ICD-10-CM | POA: Diagnosis not present

## 2023-06-18 MED ORDER — METHYLPREDNISOLONE ACETATE 40 MG/ML IJ SUSP
20.0000 mg | Freq: Once | INTRAMUSCULAR | Status: AC
  Administered 2023-06-18: 20 mg via INTRA_ARTICULAR

## 2023-06-18 MED ORDER — METHYLPREDNISOLONE ACETATE 40 MG/ML IJ SUSP
40.0000 mg | Freq: Once | INTRAMUSCULAR | Status: AC
  Administered 2023-06-18: 40 mg via INTRA_ARTICULAR

## 2023-06-18 NOTE — Addendum Note (Signed)
 Addended by: Rodgers Clack on: 06/18/2023 05:35 PM   Modules accepted: Level of Service

## 2023-06-18 NOTE — Progress Notes (Signed)
 PCP: Wilhemena Harbour, MD  Subjective:   CC: LT shoulder pain and RT hand pain  HPI: Patient is a 58 y.o. female here for LT shoulder pain and RT hand pain.  LT shoulder pain: [name] endorses several years of shoulder pain bilaterally but her LT shoulder has been especially bothersome over the last 2-3 months. She is having pain, especially with overhead movements and at night. No weakness, radiating pain down her arm or neck pain. No improvement with meloxicam . She has been doing some of her at-home exercises from her previous visits with no improvement. She has previously had injection with relief and is interested in one today.  RT hand pain:  Worsening pain at her 4th distal metacarpal on the palmar surface for 5 months and has had difficulty bending her 4th digit. She is having increasing episodes of waking up with her 4th digit locked in flexion and having to extend it manually with her left hand. This is the first time she has had this problem with any digit. She tried massage and meloxicam  with no improvement. Pt is RT handed and works as Producer, television/film/video.  Past Medical History:  Diagnosis Date   Acute bilateral low back pain with right-sided sciatica 10/09/2014   Allergic rhinitis 05/11/2017   Allergy    Back pain 06/05/2021   Bilateral leg cramps 03/28/2020   Cervical cancer screening 04/20/2020   Cough due to angiotensin-converting enzyme inhibitor 05/27/2010   Diastolic heart failure 02/2010   NEW ON ECHO 02/2010   Diastolic heart failure (HCC) 11/29/2010   ECHO performed 09/09/10 Left ventricle: The cavity size was normal. There was mild     concentric hypertrophy. Systolic function was vigorous. The     estimated ejection fraction was in the range of 65% to 70%. Wall     motion was normal; there were no regional wall motion     abnormalities. Features are consistent with a pseudonormal left     ventricular filling pattern, with concomitant abnormal   DUB (dysfunctional uterine  bleeding) 04/04/2011   03/2011 U/S:Small fibroids, one of which demonstrates mass effect upon the endometrium and could correlate with history of dysfunctional uterine bleeding. Endometrial Biopsy 07/2012 Secretory endometrium with no hyperplasia or carcinoma.  07/2012 pap smear negative for intraepithelial neoplasia. FSH on menopausal range LH, TSH, Prolactin and Testosterone  wnl.      Encounter for allergy testing 05/13/2020   Healthcare maintenance 04/29/2021   Hemoptysis 02/2010   DUE TO ALV HGE.Aaron AasAaron AasANA NEGATIVE...DEEMED DUE TO DIASTOLIC CHF   HEMOPTYSIS UNSPECIFIED 03/28/2010   Followed in Pulmonary clinic/ [facility]/ [facility] med regimen --med calendar 10/10/2010  3 episodes all related to hypertension with diastolic chf - last august 2012. CXR clear 11/21/10     History of anemia 11/29/2010   Last Hb 10.1 on 08/21 baseline around 9.    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   PPD positive    NEGATIVE DURING CHILDHOOD IN [employer]1ST + TEST 1993 DURING IMMIGRATION GRREN CARD CLEARENCE.Aaron AasCXR NORMAL 1993 PER HX..s/p INR Rx AT [facility].Aaron AasREPEAT + 02/2010   Right knee pain 05/06/2011   Seasonal and perennial allergic rhinoconjunctivitis 11/05/2020   Upper airway cough syndrome 03/28/2020   Vitamin B12 deficiency 09/25/2016   Vitamin D  deficiency 09/24/2016    Current Outpatient Medications on File Prior to Visit  Medication  Sig Dispense Refill   albuterol  (VENTOLIN  HFA) 108 (90 Base) MCG/ACT inhaler Inhale 2 puffs into the lungs every 6 (six) hours as needed for wheezing or shortness of breath. (Patient not taking: Reported on 06/09/2023) 8 g 2   amLODipine  (NORVASC ) 10 MG tablet TAKE 1 TABLET BY MOUTH AT NIGHT 90 tablet 1   atorvastatin  (LIPITOR) 20 MG tablet Take 1 tablet (20 mg total) by mouth daily. 90 tablet  3   azelastine  (ASTELIN ) 0.1 % nasal spray 2 sprays in each nostril twice a day as needed.  You may use this as needed for nasal congestion/itchy ears if desired (Patient not taking: Reported on 12/19/2021) 30 mL 4   fluticasone  (FLONASE ) 50 MCG/ACT nasal spray Place 2 sprays into both nostrils daily. (Patient not taking: Reported on 11/13/2022) 16 g 6   meloxicam  (MOBIC ) 15 MG tablet TAKE 1 TABLET(15 MG) BY MOUTH DAILY 90 tablet 0   metoprolol  tartrate (LOPRESSOR ) 25 MG tablet Take 1 tablet (25 mg total) by mouth 2 (two) times daily. 180 tablet 3   No current facility-administered medications on file prior to visit.    Past Surgical History:  Procedure Laterality Date   CESAREAN SECTION     TUBAL LIGATION      Allergies  Allergen Reactions   Crestor  [Rosuvastatin ] Other (See Comments)    Headache     BP 138/88   Ht 5\' 5"  (1.651 m)   Wt 235 lb (106.6 kg)   LMP  (LMP Unknown) Comment: pt states 1.5 years ago   BMI 39.11 kg/m      07/10/2021    9:24 AM  [facility]  Frequency of aerobic exercise (# of days/week) 1  Average time in minutes 50  Frequency of strengthening activities (# of days/week) 7        No data to display              Objective:  Physical Exam:  Gen: NAD, comfortable in exam room  LT Shoulder Exam: Inspection: No obvious deformity. Palpation: TTP at [facility] joint, bicipital groove, and anterior shoulder. No TTP along spine of scapula or posterior shoulder. ROM: Limited active abduction to 110 degrees. Limited active forward flexion to 90 degrees. No limited ROM with passive abduction and flexion. Full ROM with active external rotation. Strength: 5/5 strength with abduction, internal/external rotation, and forward flexion. Special Testing: Positive Empty Can. Positive Neer's. Positive Hawkins. Positive Speed's. Positive Yergason's. Negative Lift Off.  RT hand exam: No obvious deformity. TTP at the distal 4th metacarpal  around A1 with palpable nodule during finger extension/flexion. No TTP across other metacarpals. Limited flexion of 4th digit. 5/5 strength with extension/flexion of the 4th digit.     Assessment & Plan:  [name] is a 58yo F referred by family medicine with a hx of calcific tendinopathy of the LT supraspinatus presenting today with 30-months of LT shoulder pain and 72-months of RT hand pain.  1. LT shoulder pain: Patient has 3 months of worsening LT shoulder pain with limited overhead activity. Exam today showed positive Neer's/Hawkins and Empty Can with limited active abduction/flexion but no limited passive ROM and FROM with external rotation. LT shoulder X-ray in 2021 showed calcific tendinopathy of LT supraspinatus. Presentation and exam most consistent with rotator cuff tendinopathy and AC impingement.Given ROM on exam, low suspicion for adhesive capsulitis. Patient has had several years of relief from steroid injection in the past and is interested in one today.  -Performed subacromial  steroid injection today with Dr. Howard Macho as described below. Patient tolerated the procedure well.  -Referral sent for formal PT for LT rotator cuff tendinopathy and impingement -F/u in 4-6 weeks or sooner if no improvement. May consider further imaging at this time.  PROCEDURE:  Risks & benefits of LT shoulder subacromial injection reviewed. Consent obtained. Time-out completed. Patient prepped and draped in the normal fashion. Area cleansed with alcohol. Ethyl chloride spray used to anesthetize the skin. Solution of 4 mL 1% lidocaine with 1 mL methylprednisolone  (Depo-medrol ) 40mg /mL injected into the LT subacromial space using a 25-gauge 1.5-inch needle via the posterior approach. Patient tolerated procedure well without any complications. Area covered with adhesive bandage. Post-procedure care reviewed. All questions answered.    2. RT hand pain: Patient has had 12-months of worsening RT hand pain with associated  locking of the 4th digit in flexion, especially in the morning. Palpable nodule at the A1 pulley of the 4th digit today on exam consistent with trigger finger. -RT 4th digit trigger finger steroid injection performed by Dr. Howard Macho. Patient tolerated the procedure well -F/u as needed if no improvement in symptoms. Will consider second injection if no improvement in symptoms.  PROCEDURE:  Risks & benefits of RT 4th digit trigger finger injection reviewed.  Consent obtained.  Time-out completed.  Patient prepped and draped in the normal fashion.  Area cleansed with alcohol.  Ethyl chloride spray used to anesthetize the skin.  Solution of 0.5 mL 1% lidocaine with 0.5 mL methylprednisolone  (Depo-Medrol ) 40mg /ml injected into RT 4th finger A1 pulley.  Patient tolerated procedure well without any complications.  Area covered with adhesive bandage.  Post-procedure care reviewed.  All questions answered.  Unknown Garbe, MS4 [employer]

## 2023-06-18 NOTE — Patient Instructions (Signed)

## 2023-06-24 ENCOUNTER — Ambulatory Visit (INDEPENDENT_AMBULATORY_CARE_PROVIDER_SITE_OTHER): Admitting: Internal Medicine

## 2023-06-24 ENCOUNTER — Encounter: Payer: Self-pay | Admitting: Internal Medicine

## 2023-06-24 VITALS — BP 123/77 | HR 61 | Temp 98.1°F | Ht 65.0 in | Wt 236.8 lb

## 2023-06-24 DIAGNOSIS — J301 Allergic rhinitis due to pollen: Secondary | ICD-10-CM | POA: Diagnosis not present

## 2023-06-24 DIAGNOSIS — R0602 Shortness of breath: Secondary | ICD-10-CM | POA: Diagnosis not present

## 2023-06-24 DIAGNOSIS — R053 Chronic cough: Secondary | ICD-10-CM | POA: Diagnosis not present

## 2023-06-24 DIAGNOSIS — R0982 Postnasal drip: Secondary | ICD-10-CM | POA: Diagnosis not present

## 2023-06-24 LAB — NITRIC OXIDE: Nitric Oxide: 17

## 2023-06-24 MED ORDER — MONTELUKAST SODIUM 10 MG PO TABS: 10.0000 mg | ORAL_TABLET | Freq: Every day | ORAL | 11 refills | Status: AC

## 2023-06-24 NOTE — Patient Instructions (Signed)
 It was a pleasure to see you today!  Please schedule follow up with myself in 4-6 weeks.  If my schedule is not open yet, we will contact you with a reminder closer to that time. Please call [phone] if you haven't heard from us  a month before, and always call us  sooner if issues or concerns arise. You can also send us  a message through MyChart, but but aware that this is not to be used for urgent issues and it may take up to 5-7 days to receive a reply. Please be aware that you will likely be able to view your results before I have a chance to respond to them. Please give us  5 business days to respond to any non-urgent results.    Before your next visit I would like you to have: Full set of PFTs  VISIT SUMMARY:  Today, we discussed your chronic shortness of breath, wheezing, and cough that you have been experiencing for the past three to four years. We reviewed your history of bronchitis, allergies, and the treatments you have tried so far. We also discussed your current symptoms and their impact on your daily activities.  YOUR PLAN:  -SHORTNESS OF BREATH: Asthma is a condition where your airways narrow and swell, producing extra mucus, which can make breathing difficult and trigger coughing, wheezing, and shortness of breath. We will order a quick breathing test to check for allergic inflammation markers and schedule a full pulmonary function test to confirm the diagnosis. Please continue taking your current allergy medication.  -CHRONIC COUGH: Your cough is most likely related to the post nasal drainage.  Postnasal drip occurs when excess mucus from the nose drips down the back of the throat, which can cause a chronic cough. We observed drainage in your throat, and your allergy medication provides some relief. We discussed other nasal sprays and medications to help with this. We will prescribe montelukast to help with the postnasal drainage.

## 2023-06-24 NOTE — Progress Notes (Signed)
 [name]    [id number]    21-May-1965  Primary Care Physician:Sowell, Ace Holder, MD  Referring Physician: Charise Companion, MD [address] [place],  [place] [address] Reason for Consultation: cough and shortness of breath Date of Consultation: 06/24/2023  Chief complaint:   Chief Complaint  Patient presents with   Consult    SOB, wheezing     HPI: Discussed the use of AI scribe software for clinical note transcription with the patient, who gave verbal consent to proceed.  History of Present Illness [name] is a 58 year old female who presents with chronic shortness of breath, wheezing, and cough.  She has experienced wheezing, shortness of breath, and a dry cough for the past three to four years. These symptoms worsen with physical exertion such as climbing steps and cleaning, necessitating frequent breaks. The symptoms do not disturb her sleep at night.  She uses an albuterol  inhaler, which initially provided relief by clearing her chest, but after a month, it began to induce coughing. No treatments have effectively alleviated her symptoms. She has a history of bronchitis but has not been diagnosed with pneumonia.  She has allergies to pollen and mold, for which she takes a daily allergy pill, previously Allegra, now a [employer] brand equivalent. This medication has helped reduce her cough, which used to be more frequent. She has tried Flonase  nasal spray but discontinued it due to headaches and throat dryness. She has concerns for ongoing post nasal drainage.   She denies any history of smoking or exposure to secondhand smoke. She works as a Associate Professor, primarily dealing with hair, and does not use hairspray or other strong-smelling products at work. She lives with her children and has no family history of lung problems.  She experiences frequent nighttime awakenings, often to use the bathroom, but does not attribute this to her breathing issues. She has a sleep study  pending per PCP.     Social history:  Occupation: Associate Professor, mostly hair.  Exposures: lives at home with children Smoking history: never smoker, no passive smoke exposure  Social History   Occupational History   Occupation: Social worker    Comment: works 4 days per week  Tobacco Use   Smoking status: Never    Passive exposure: Never   Smokeless tobacco: Never  Substance and Sexual Activity   Alcohol use: No   Drug use: No   Sexual activity: Yes    Birth control/protection: Surgical, Post-menopausal    Comment: BTL 58 years old    Relevant family history:  Family History  Problem Relation Age of Onset   Hypertension Father    Sudden death Father    Hyperlipidemia Neg Hx    Heart attack Neg Hx    Diabetes Neg Hx    Colon cancer Neg Hx    Colon polyps Neg Hx    Esophageal cancer Neg Hx    Stomach cancer Neg Hx    Rectal cancer Neg Hx    Asthma Neg Hx     Past Medical History:  Diagnosis Date   Acute bilateral low back pain with right-sided sciatica 10/09/2014   Allergic rhinitis 05/11/2017   Allergy    Back pain 06/05/2021   Bilateral leg cramps 03/28/2020   Cervical cancer screening 04/20/2020   Cough due to angiotensin-converting enzyme inhibitor 05/27/2010   Diastolic heart failure 02/2010   NEW ON ECHO 02/2010   Diastolic heart failure (HCC) 11/29/2010  ECHO performed 09/09/10 Left ventricle: The cavity size was normal. There was mild     concentric hypertrophy. Systolic function was vigorous. The     estimated ejection fraction was in the range of 65% to 70%. Wall     motion was normal; there were no regional wall motion     abnormalities. Features are consistent with a pseudonormal left     ventricular filling pattern, with concomitant abnormal   DUB (dysfunctional uterine bleeding) 04/04/2011   03/2011 U/S:Small fibroids, one of which demonstrates mass effect upon the endometrium and could correlate with history of dysfunctional uterine bleeding.  Endometrial Biopsy 07/2012 Secretory endometrium with no hyperplasia or carcinoma.  07/2012 pap smear negative for intraepithelial neoplasia. FSH on menopausal range LH, TSH, Prolactin and Testosterone  wnl.      Encounter for allergy testing 05/13/2020   Healthcare maintenance 04/29/2021   Hemoptysis 02/2010   DUE TO ALV HGE.Aaron AasAaron AasANA NEGATIVE...DEEMED DUE TO DIASTOLIC CHF   HEMOPTYSIS UNSPECIFIED 03/28/2010   Followed in Pulmonary clinic/  Healthcare/ Ramaswamy Complex med regimen --med calendar 10/10/2010  3 episodes all related to hypertension with diastolic chf - last august 2012. CXR clear 11/21/10     History of anemia 11/29/2010   Last Hb 10.1 on 08/21 baseline around 9.    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   PPD positive    NEGATIVE DURING CHILDHOOD IN [employer]1ST + TEST 1993 DURING IMMIGRATION GRREN CARD CLEARENCE.Aaron AasCXR NORMAL 1993 PER HX..s/p [facility]R Rx AT [facility].Aaron AasREPEAT + 02/2010   Right knee pain 05/06/2011   Seasonal and perennial allergic rhinoconjunctivitis 11/05/2020   Upper airway cough syndrome 03/28/2020   Vitamin B12 deficiency 09/25/2016   Vitamin D  deficiency 09/24/2016    Past Surgical History:  Procedure Laterality Date   CESAREAN SECTION     TUBAL LIGATION       Physical Exam: Blood pressure 123/77, pulse 61, temperature 98.1 F (36.7 C), temperature source Oral, height 5\' 5"  (1.651 m), weight 236 lb 12.8 oz (107.4 kg), SpO2 97%. Gen:      No acute distress ENT:  +cobblestoning, no nasal polyps, mucus membranes moist Lungs:    No increased respiratory effort, symmetric chest wall excursion, clear to auscultation bilaterally, no wheezes or crackles CV:         Regular rate and rhythm; no murmurs, rubs, or gallops.  No pedal edema Abd:      + bowel sounds; soft, non-tender; no  distension MSK: no acute synovitis of DIP or PIP joints, no mechanics hands.  Skin:      Warm and dry; no rashes Neuro: normal speech, no focal facial asymmetry Psych: alert and oriented x3, normal mood and affect   Data Reviewed/Medical Decision Making:  Independent interpretation of tests: Imaging:  Review of patient's chest xray May 2024 images revealed no acute cardiopulmonary process. The patient's images have been independently reviewed by me.    PFTs: I have personally reviewed the patient's PFTs and      No data to display          Labs:  Lab Results  Component Value Date   NA 143 06/10/2023   K 4.3 06/10/2023   CO2 23 06/10/2023   GLUCOSE 109 (H) 06/10/2023   BUN 17 06/10/2023   CREATININE 0.89 06/10/2023   CALCIUM  9.5 06/10/2023   EGFR  75 06/10/2023   GFRNONAA 76 02/17/2019     Immunization status:  Immunization History  Administered Date(s) Administered   Influenza Split 11/21/2010   Influenza Whole 03/21/2010   Influenza, Seasonal, Injecte, Preservative Fre 10/30/2022   Meningococcal Mcv4o 04/17/2021   PFIZER Comirnaty(Gray Top)Covid-19 Tri-Sucrose Vaccine 06/26/2019, 07/19/2019, 04/20/2020   PNEUMOCOCCAL CONJUGATE-20 10/30/2022   Pfizer Covid-19 Vaccine Bivalent Booster 23yrs & up 04/23/2021   Pfizer(Comirnaty)Fall Seasonal Vaccine 12 years and older 10/30/2022   Tdap 01/02/2011, 06/02/2022     I reviewed prior external note(s) from pcp  I reviewed the result(s) of the labs and imaging as noted above.   I have ordered pft  Assessment and Plan Assessment & Plan Shortness of breath Chronic dyspnea, wheezing, and dry cough for 3-4 years. Albuterol  inhaler initially effective, later caused irritation. Differential includes asthma and allergic rhinitis. Could be related to weight gain, deconditioning, diastolic dysfunction Normal chest X-ray. Pulmonary function tests needed for confirmation. She doesn't seem to have triggers beyond exertion so asthma  is less likely, especially since albuterol  does not provide relief.  - Feno today  17 ppb - Schedule full pulmonary function test.  Chronic Cough Allergic rhinitis Postnasal drip Known allergies to pollen and mold. Symptoms managed with daily medication. Fluticasone  nasal spray discontinued due to side effects. Montelukast considered for postnasal drainage. - Continue current allergy medication fexofenadine - Prescribe montelukast for postnasal drainage.    Return to Care: Return in about 4 weeks (around 07/22/2023) for next available PFT, follow up after.  Louie Rover, MD Pulmonary and Critical Care Medicine [facility] Office:[phone]  CC: Charise Companion, MD

## 2023-06-30 ENCOUNTER — Encounter: Payer: Self-pay | Admitting: *Deleted

## 2023-07-06 ENCOUNTER — Telehealth: Payer: Self-pay | Admitting: Student

## 2023-07-06 ENCOUNTER — Ambulatory Visit: Attending: Family Medicine | Admitting: Physical Therapy

## 2023-07-06 ENCOUNTER — Ambulatory Visit

## 2023-07-06 ENCOUNTER — Other Ambulatory Visit: Payer: Self-pay

## 2023-07-06 DIAGNOSIS — G8929 Other chronic pain: Secondary | ICD-10-CM

## 2023-07-06 DIAGNOSIS — M6281 Muscle weakness (generalized): Secondary | ICD-10-CM | POA: Diagnosis present

## 2023-07-06 DIAGNOSIS — R293 Abnormal posture: Secondary | ICD-10-CM | POA: Insufficient documentation

## 2023-07-06 DIAGNOSIS — M25512 Pain in left shoulder: Secondary | ICD-10-CM | POA: Diagnosis present

## 2023-07-06 NOTE — Therapy (Signed)
 OUTPATIENT PHYSICAL THERAPY SHOULDER EVALUATION   Patient Name: [name] MRN: [id number] DOB:[birth year], 58 y.o., female Today's Date: 07/06/2023  END OF SESSION:  PT End of Session - 07/06/23 1131     Visit Number 1    Number of Visits 17    Date for PT Re-Evaluation 08/31/23    Authorization Type [employer] MCD    PT Start Time 1043    PT Stop Time 1119    PT Time Calculation (min) 36 min    Activity Tolerance Patient tolerated treatment well    Behavior During Therapy [facility] for tasks assessed/performed          Past Medical History:  Diagnosis Date   Acute bilateral low back pain with right-sided sciatica 10/09/2014   Allergic rhinitis 05/11/2017   Allergy    Back pain 06/05/2021   Bilateral leg cramps 03/28/2020   Cervical cancer screening 04/20/2020   Cough due to angiotensin-converting enzyme inhibitor 05/27/2010   Diastolic heart failure 02/2010   NEW ON ECHO 02/2010   Diastolic heart failure (HCC) 11/29/2010   ECHO performed 09/09/10 Left ventricle: The cavity size was normal. There was mild     concentric hypertrophy. Systolic function was vigorous. The     estimated ejection fraction was in the range of 65% to 70%. Wall     motion was normal; there were no regional wall motion     abnormalities. Features are consistent with a pseudonormal left     ventricular filling pattern, with concomitant abnormal   DUB (dysfunctional uterine bleeding) 04/04/2011   03/2011 U/S:Small fibroids, one of which demonstrates mass effect upon the endometrium and could correlate with history of dysfunctional uterine bleeding. Endometrial Biopsy 07/2012 Secretory endometrium with no hyperplasia or carcinoma.  07/2012 pap smear negative for intraepithelial neoplasia. FSH on menopausal range LH, TSH, Prolactin and Testosterone  wnl.      Encounter for allergy testing 05/13/2020   Healthcare maintenance 04/29/2021   Hemoptysis 02/2010   DUE TO ALV HGE.Aaron AasAaron AasANA NEGATIVE...DEEMED DUE TO DIASTOLIC CHF    HEMOPTYSIS UNSPECIFIED 03/28/2010   Followed in Pulmonary clinic/ [facility]/ Ramaswamy Complex med regimen --med calendar 10/10/2010  3 episodes all related to hypertension with diastolic chf - last august 2012. CXR clear 11/21/10     History of anemia 11/29/2010   Last Hb 10.1 on 08/21 baseline around 9.    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   PPD positive    NEGATIVE DURING CHILDHOOD IN [employer]1ST + TEST 1993 DURING IMMIGRATION GRREN CARD CLEARENCE.Aaron AasCXR NORMAL 1993 PER HX..s/p [facility]R Rx AT [facility].Aaron AasREPEAT + 02/2010   Right knee pain 05/06/2011   Seasonal and perennial allergic rhinoconjunctivitis 11/05/2020   Upper airway cough syndrome 03/28/2020   Vitamin B12 deficiency 09/25/2016   Vitamin D  deficiency 09/24/2016   Past Surgical History:  Procedure Laterality Date   CESAREAN SECTION     TUBAL LIGATION     Patient Active Problem List   Diagnosis Date Noted   Prediabetes 05/23/2022   Muscle strain of shoulder region, left, sequela 07/03/2021   Hyperlipidemia 09/24/2016   GERD (gastroesophageal reflux disease) 10/09/2014   Hyperthyroidism 11/29/2010   Essential hypertension 03/28/2010    PCP: Wilhemena Harbour, MD  REFERRING PROVIDER: Rodgers Clack, DO  REFERRING DIAG: [phone] (ICD-10-CM) - Chronic left shoulder pain   THERAPY DIAG:  Chronic  left shoulder pain  Muscle weakness (generalized)  Abnormal posture  Rationale for Evaluation and Treatment: Rehabilitation  ONSET DATE: Chronic  SUBJECTIVE:                                                                                                                                                                                      SUBJECTIVE STATEMENT: Pt presents to PT with reports of chronic L shoulder pain and discomfort. Denies  trauma or MOI, same discomfort used to be in the R shoulder but decreased after injection. Injection did not change any symptoms on L, has been gradually getting worse since January. No N/T or referral of pain outside of anterior L shoulder.  Hand dominance: Right  PERTINENT HISTORY: See PMH  PAIN:  Are you having pain?  Yes: NPRS scale: 8/10 Worst: 10/10 Pain location: L anterior shoulder Pain description: sharp Aggravating factors: OH reaching, lifting, styling hair Relieving factors: none  PRECAUTIONS: None  RED FLAGS: None   WEIGHT BEARING RESTRICTIONS: No  FALLS:  Has patient fallen in last 6 months? No  LIVING ENVIRONMENT: Lives with: lives with their family Lives in: House/apartment  OCCUPATION: Hair stylest   PLOF: Independent  PATIENT GOALS: decrease pain and improve functional ROM of L shoulder, be able to style hair without pain  NEXT MD VISIT: 07/28/2023  OBJECTIVE:  Note: Objective measures were completed at Evaluation unless otherwise noted.  DIAGNOSTIC FINDINGS:  See Imaging   PATIENT SURVEYS:  Quick DASH: 68% disability  COGNITION: Overall cognitive status: Within functional limits for tasks assessed     SENSATION: WFL  POSTURE: Rounded shoulders  UPPER EXTREMITY ROM:   Active ROM Right eval Left eval  Shoulder flexion 160 83  Shoulder extension    Shoulder abduction 170 70  Shoulder adduction    Shoulder internal rotation T8 L5  Shoulder external rotation 70 25  Elbow flexion    Elbow extension    Wrist flexion    Wrist extension    Wrist ulnar deviation    Wrist radial deviation    Wrist pronation    Wrist supination    (Blank rows = not tested)  UPPER EXTREMITY MMT:  MMT Right eval Left eval  Shoulder flexion    Shoulder extension    Shoulder abduction    Shoulder adduction    Shoulder internal rotation 5 3+  Shoulder external rotation 5 3 with p!  Middle trapezius    Lower trapezius    Elbow flexion    Elbow  extension    Wrist flexion    Wrist extension    Wrist ulnar deviation    Wrist radial deviation  Wrist pronation    Wrist supination    Grip strength (lbs)    (Blank rows = not tested)  JOINT MOBILITY TESTING:  L GH hypomobility  PALPATION:  TTP to L infraspinatus, L biceps tendon                                                                                                                          TREATMENT: [facility] Adult PT Treatment:                                                DATE: 07/06/2023 Therapeutic Exercise: L shoulder IR/ER isometric x 10 - 5 hold Row x 10 GTB L shoulder table slide x 5 - 5 hold Supine L shoulder flex - attempted, increased pain  PATIENT EDUCATION: Education details: eval findings, Quick DASH, HEP, POC Person educated: Patient Education method: Explanation, Demonstration, and Handouts Education comprehension: verbalized understanding and returned demonstration  HOME EXERCISE PROGRAM: Access Code: [email or web address] URL: [email or web address] Date: 07/06/2023 Prepared by: Loral Roch  Exercises - Standing Isometric Shoulder External Rotation with Doorway and Towel Roll  - 1 x daily - 7 x weekly - 2 sets - 10 reps - 5 sec hold - Standing Isometric Shoulder Internal Rotation with Towel Roll at Doorway  - 1 x daily - 7 x weekly - 2 sets - 10 reps - 5 sec hold - Standing Shoulder Row with Anchored Resistance  - 1 x daily - 7 x weekly - 3 sets - 10 reps - green band hold - Seated Shoulder Flexion Towel Slide at Table Top  - 1 x daily - 7 x weekly - 2 sets - 10 reps - 5 sec hold  ASSESSMENT:  CLINICAL IMPRESSION: Patient is a 58 y.o. F who was seen today for physical therapy evaluation and treatment for chronic L shoulder pain. Physical findings are consistent with referring provider impression as pt demonstrates decrease in L shoulder ROM and RTC strength. Quick DASH score demonstrates 68% disability in performance of home ADLs and  community activities. Pt would benefit from skilled PT services working on improving strength and ROM of L shoulder in order to improve function and decrease pain.   OBJECTIVE IMPAIRMENTS: decreased endurance, decreased mobility, decreased ROM, decreased strength, impaired UE functional use, postural dysfunction, and pain  ACTIVITY LIMITATIONS: carrying, lifting, bathing, toileting, dressing, and reach over head  PARTICIPATION LIMITATIONS: cleaning, laundry, driving, shopping, community activity, occupation, and yard work  PERSONAL FACTORS: Time since onset of injury/illness/exacerbation and 1 comorbidity: CHF are also affecting patient's functional outcome.   REHAB POTENTIAL: Good  CLINICAL DECISION MAKING: Stable/uncomplicated  EVALUATION COMPLEXITY: Low   GOALS: Goals reviewed with patient? No  SHORT TERM GOALS: Target date: 07/27/2023   Pt will be compliant and knowledgeable with initial HEP for improved comfort and carryover Baseline: initial HEP given  Goal status: INITIAL  2.  Pt will self report left shoulder pain no greater than 7/10 for improved comfort and functional ability Baseline: 10/10 at worst Goal status: INITIAL   LONG TERM GOALS: Target date: 08/31/2023   Pt will decrease Quick DASH disability score to no greater than 50% as proxy for functional improvement with home ADLs and community activities Baseline: 68%  Goal status: INITIAL  2.  Pt will self report left shoulder pain no greater than 3/10 for improved comfort and functional ability Baseline: 10/10 at worst Goal status: INITIAL   3.  Pt will improve L shoulder flexion AROM to at least 140 degrees for improved functional ability with home ADLs Baseline: 83 Goal status: INITIAL  4.  Pt will improve left shoulder IR to at least T10 for improved hygiene activities and decreased pain Baseline: L5 Goal status: INITIAL  5.  Pt will improve left shoulder IR/ER MMT to no less than 4/5 for improved comfort  and dynamic stability Baseline: see MMT chart Goal status: INITIAL  \ PLAN:  PT FREQUENCY: 2x/week  PT DURATION: 8 weeks  PLANNED INTERVENTIONS: 97164- PT Re-evaluation, 97110-Therapeutic exercises, 97530- Therapeutic activity, [id number]- Neuromuscular re-education, 97535- Self Care, 91478- Manual therapy, [id number]- Electrical stimulation (unattended), [id number]- Electrical stimulation (manual), 97016- Vasopneumatic device, 20560 (1-2 muscles), 20561 (3+ muscles)- Dry Needling, Cryotherapy, and Moist heat  PLAN FOR NEXT SESSION: assess HEP response, periscapular and RTC strengthening, improve shoulder ROM   Ivor Mars, PT 07/06/2023, 11:33 AM

## 2023-07-06 NOTE — Telephone Encounter (Signed)
 Patient came in asking about the sleep referral that was supposed to be placed during her visit on 06/09/23. She has not heard anything, a I did not see anything placed for her. Please let her know

## 2023-07-06 NOTE — Therapy (Incomplete)
 OUTPATIENT PHYSICAL THERAPY UPPER EXTREMITY EVALUATION   Patient Name: [name] MRN: [id number] DOB:1965/10/25, 58 y.o., female Today's Date: 07/06/2023  END OF SESSION:   Past Medical History:  Diagnosis Date   Acute bilateral low back pain with right-sided sciatica 10/09/2014   Allergic rhinitis 05/11/2017   Allergy    Back pain 06/05/2021   Bilateral leg cramps 03/28/2020   Cervical cancer screening 04/20/2020   Cough due to angiotensin-converting enzyme inhibitor 05/27/2010   Diastolic heart failure 02/2010   NEW ON ECHO 02/2010   Diastolic heart failure (HCC) 11/29/2010   ECHO performed 09/09/10 Left ventricle: The cavity size was normal. There was mild     concentric hypertrophy. Systolic function was vigorous. The     estimated ejection fraction was in the range of 65% to 70%. Wall     motion was normal; there were no regional wall motion     abnormalities. Features are consistent with a pseudonormal left     ventricular filling pattern, with concomitant abnormal   DUB (dysfunctional uterine bleeding) 04/04/2011   03/2011 U/S:Small fibroids, one of which demonstrates mass effect upon the endometrium and could correlate with history of dysfunctional uterine bleeding. Endometrial Biopsy 07/2012 Secretory endometrium with no hyperplasia or carcinoma.  07/2012 pap smear negative for intraepithelial neoplasia. FSH on menopausal range LH, TSH, Prolactin and Testosterone  wnl.      Encounter for allergy testing 05/13/2020   Healthcare maintenance 04/29/2021   Hemoptysis 02/2010   DUE TO ALV HGE.Aaron AasAaron AasANA NEGATIVE...DEEMED DUE TO DIASTOLIC CHF   HEMOPTYSIS UNSPECIFIED 03/28/2010   Followed in Pulmonary clinic/ [facility]/ Ramaswamy Complex med regimen --med calendar 10/10/2010  3 episodes all related to hypertension with diastolic chf - last august 2012. CXR clear 11/21/10     History of anemia 11/29/2010   Last Hb 10.1 on 08/21 baseline around 9.    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   PPD positive    NEGATIVE DURING CHILDHOOD IN [employer]1ST + TEST 1993 DURING IMMIGRATION GRREN CARD CLEARENCE.Aaron AasCXR NORMAL 1993 PER HX..s/p INR Rx AT [facility].Aaron AasREPEAT + 02/2010   Right knee pain 05/06/2011   Seasonal and perennial allergic rhinoconjunctivitis 11/05/2020   Upper airway cough syndrome 03/28/2020   Vitamin B12 deficiency 09/25/2016   Vitamin D  deficiency 09/24/2016   Past Surgical History:  Procedure Laterality Date   CESAREAN SECTION     TUBAL LIGATION     Patient Active Problem List   Diagnosis Date Noted   Prediabetes 05/23/2022   Muscle strain of shoulder region, left, sequela 07/03/2021   Hyperlipidemia 09/24/2016   GERD (gastroesophageal reflux disease) 10/09/2014   Hyperthyroidism 11/29/2010   Essential hypertension 03/28/2010    PCP: Wilhemena Harbour, MD  REFERRING PROVIDER: Rodgers Clack, DO  REFERRING DIAG: Chronic left shoulder pain [M25.512, G89.29]   Rationale for Evaluation and Treatment: Rehabilitation  THERAPY DIAG:  No diagnosis found.  PERTINENT HISTORY: HTN, Hyperthyroidism, prediabetes, GERD  WEIGHT BEARING RESTRICTIONS: {Yes ***/No:24003}  FALLS:  Has patient fallen in last 6 months? {fallsyesno:27318}  LIVING ENVIRONMENT: Lives with: {[facility] lives with:25569::lives with their family} Lives in: {Lives in:25570} Stairs: {opstairs:27293} Has following equipment at home: {Assistive devices:23999}  OCCUPATION: ***   PRECAUTIONS: {Therapy precautions:24002} ---------------------------------------------------------------------------------------------  SUBJECTIVE:  SUBJECTIVE  STATEMENT: Eval statement 07/06/2023: *** Hand dominance: {MISC; OT HAND DOMINANCE:[phone]}  RED FLAGS: {PT Red Flags:29287}   PLOF: {PLOF:24004}  PATIENT GOALS: ***  NEXT MD VISIT: *** ---------------------------------------------------------------------------------------------  OBJECTIVE:  Note: Objective measures were completed at Evaluation unless otherwise noted.  DIAGNOSTIC FINDINGS:  ***  PATIENT SURVEYS :  {rehab surveys:24030:a}  COGNITION: Overall cognitive status: {cognition:24006}     SENSATION: {sensation:27233}  POSTURE: ***  UPPER EXTREMITY ROM:   {AROM/PROM:27142} ROM Right eval Left eval  Shoulder flexion    Shoulder extension    Shoulder abduction    Shoulder adduction    Shoulder internal rotation    Shoulder external rotation    Elbow flexion    Elbow extension    Wrist flexion    Wrist extension    Wrist ulnar deviation    Wrist radial deviation    Wrist pronation    Wrist supination    (Blank rows = not tested)  UPPER EXTREMITY MMT:  MMT Right eval Left eval  Shoulder flexion    Shoulder extension    Shoulder abduction    Shoulder adduction    Shoulder internal rotation    Shoulder external rotation    Middle trapezius    Lower trapezius    Elbow flexion    Elbow extension    Wrist flexion    Wrist extension    Wrist ulnar deviation    Wrist radial deviation    Wrist pronation    Wrist supination    Grip strength (lbs)    (Blank rows = not tested)  SHOULDER SPECIAL TESTS: Impingement tests: {shoulder impingement test:25231:a} SLAP lesions: {SLAP lesions:25232} Instability tests: {shoulder instability test:25233} Rotator cuff assessment: {rotator cuff assessment:25234} Biceps assessment: {biceps assessment:25235}  JOINT MOBILITY TESTING:  ***  PALPATION:  ***                                             [facility] Adult PT Treatment:                                                DATE: 07/06/2023  Therapeutic  Exercise: *** Manual Therapy: *** Neuromuscular re-ed: *** Therapeutic Activity: *** Modalities: *** Self Care: ***   PATIENT EDUCATION: Education details: Pt received education regarding HEP performance, ADL performance, functional activity tolerance, impairment education, appropriate performance of therapeutic activities. Person educated: {Person educated:25204} Education method: {Education Method:25205} Education comprehension: {Education Comprehension:25206}  HOME EXERCISE PROGRAM: *** ---------------------------------------------------------------------------------------------  ASSESSMENT:  CLINICAL IMPRESSION: Eval impression (07/06/2023): Pt. attended today's physical therapy session for evaluation of ***. Pt has complaints of ***. Pt has notable deficits with ***.  Signs and symptoms are concurrent with ***. Pt would benefit from therapeutic focus on ***.  Treatment performed today focused on *** Pt demonstrated *** understanding of education provided. required *** cues and *** assistance for appropriate performance with today's activities.  Pt requires the intervention of skilled outpatient physical therapy to address the aforementioned deficits and progress towards a functional level in line with therapeutic goals.    OBJECTIVE IMPAIRMENTS: {opptimpairments:25111}.   ACTIVITY LIMITATIONS: {activitylimitations:27494}  PARTICIPATION LIMITATIONS: {participationrestrictions:25113}  PERSONAL FACTORS: {Personal factors:25162} are also affecting patient's functional outcome.   REHAB POTENTIAL: {rehabpotential:25112}  CLINICAL DECISION MAKING: {clinical  decision making:25114}  EVALUATION COMPLEXITY: {Evaluation complexity:25115}  GOALS: Goals reviewed with patient? Yes  SHORT TERM GOALS: Target date: ***  Pt will be independent with administered HEP to demonstrate the competency necessary for long term managemnet of symptoms at home.  Baseline: Goal status:  {GOALSTATUS:25110}  2.  *** Baseline:  Goal status: {GOALSTATUS:25110}  3.  *** Baseline:  Goal status: {GOALSTATUS:25110}  4.  *** Baseline:  Goal status: {GOALSTATUS:25110}  5.  *** Baseline:  Goal status: {GOALSTATUS:25110}  6.  *** Baseline:  Goal status: {GOALSTATUS:25110}  LONG TERM GOALS: Target date: ***  Pt. Will achieve a DASH score of *** as to demonstrate improvement in self-perceived functional ability with daily activities.  Baseline:  Goal status: {GOALSTATUS:25110}  2.  Pt will report pain levels improving during ADLs to be less than or equal to ***/10 as to demonstrate improved tolerance with daily functional activities such as ***.  Baseline:  Goal status: {GOALSTATUS:25110}  3.  Pt will improve MMT score for *** to a ***/5 to demonstrate improvement in strength for quality of motion and activity performance.  Baseline:  Goal status: {GOALSTATUS:25110}  4.  *** Baseline:  Goal status: {GOALSTATUS:25110}  5.  *** Baseline:  Goal status: {GOALSTATUS:25110}  6.  *** Baseline:  Goal status: {GOALSTATUS:25110} ---------------------------------------------------------------------------------------------  PLAN: PT FREQUENCY: 1-2x/week  PT DURATION: 6 weeks  PLANNED INTERVENTIONS: 97110-Therapeutic exercises, 97530- Therapeutic activity, 97112- Neuromuscular re-education, 97535- Self Care, 40981- Manual therapy, [phone]- Electrical stimulation (manual), Patient/Family education, Taping, and Joint mobilization  PLAN FOR NEXT SESSION: Review HEP, Begin POC as detailed in assessment   Bunny Caroli, PT 07/06/2023, 7:37 AM

## 2023-07-06 NOTE — Telephone Encounter (Signed)
 Patient

## 2023-07-13 ENCOUNTER — Ambulatory Visit

## 2023-07-13 DIAGNOSIS — M25512 Pain in left shoulder: Secondary | ICD-10-CM | POA: Diagnosis not present

## 2023-07-13 DIAGNOSIS — M6281 Muscle weakness (generalized): Secondary | ICD-10-CM

## 2023-07-13 DIAGNOSIS — R293 Abnormal posture: Secondary | ICD-10-CM

## 2023-07-13 DIAGNOSIS — G8929 Other chronic pain: Secondary | ICD-10-CM

## 2023-07-13 NOTE — Therapy (Signed)
 OUTPATIENT PHYSICAL THERAPY TREATMENT   Patient Name: [name] MRN: [id number] DOB:[birth year], 58 y.o., female Today's Date: 07/13/2023  END OF SESSION:  PT End of Session - 07/13/23 0841     Visit Number 2    Number of Visits 17    Date for PT Re-Evaluation 08/31/23    Authorization Type [employer] MCD    PT Start Time 0847    PT Stop Time 0927    PT Time Calculation (min) 40 min    Activity Tolerance Patient tolerated treatment well    Behavior During Therapy [facility] for tasks assessed/performed           Past Medical History:  Diagnosis Date   Acute bilateral low back pain with right-sided sciatica 10/09/2014   Allergic rhinitis 05/11/2017   Allergy    Back pain 06/05/2021   Bilateral leg cramps 03/28/2020   Cervical cancer screening 04/20/2020   Cough due to angiotensin-converting enzyme inhibitor 05/27/2010   Diastolic heart failure 02/2010   NEW ON ECHO 02/2010   Diastolic heart failure (HCC) 11/29/2010   ECHO performed 09/09/10 Left ventricle: The cavity size was normal. There was mild     concentric hypertrophy. Systolic function was vigorous. The     estimated ejection fraction was in the range of 65% to 70%. Wall     motion was normal; there were no regional wall motion     abnormalities. Features are consistent with a pseudonormal left     ventricular filling pattern, with concomitant abnormal   DUB (dysfunctional uterine bleeding) 04/04/2011   03/2011 U/S:Small fibroids, one of which demonstrates mass effect upon the endometrium and could correlate with history of dysfunctional uterine bleeding. Endometrial Biopsy 07/2012 Secretory endometrium with no hyperplasia or carcinoma.  07/2012 pap smear negative for intraepithelial neoplasia. FSH on menopausal range LH, TSH, Prolactin and Testosterone  wnl.      Encounter for allergy testing 05/13/2020   Healthcare maintenance 04/29/2021   Hemoptysis 02/2010   DUE TO ALV HGE.SABRASABRAANA NEGATIVE...DEEMED DUE TO DIASTOLIC CHF   HEMOPTYSIS  UNSPECIFIED 03/28/2010   Followed in Pulmonary clinic/ [facility]/ Ramaswamy Complex med regimen --med calendar 10/10/2010  3 episodes all related to hypertension with diastolic chf - last august 2012. CXR clear 11/21/10     History of anemia 11/29/2010   Last Hb 10.1 on 08/21 baseline around 9.    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   PPD positive    NEGATIVE DURING CHILDHOOD IN [employer]1ST + TEST 1993 DURING IMMIGRATION GRREN CARD CLEARENCE.SABRACXR NORMAL 1993 PER HX..s/p INR Rx AT [facility].SABRAREPEAT + 02/2010   Right knee pain 05/06/2011   Seasonal and perennial allergic rhinoconjunctivitis 11/05/2020   Upper airway cough syndrome 03/28/2020   Vitamin B12 deficiency 09/25/2016   Vitamin D  deficiency 09/24/2016   Past Surgical History:  Procedure Laterality Date   CESAREAN SECTION     TUBAL LIGATION     Patient Active Problem List   Diagnosis Date Noted   Prediabetes 05/23/2022   Muscle strain of shoulder region, left, sequela 07/03/2021   Hyperlipidemia 09/24/2016   GERD (gastroesophageal reflux disease) 10/09/2014   Hyperthyroidism 11/29/2010   Essential hypertension 03/28/2010    PCP: Jennelle Riis, MD  REFERRING PROVIDER: Teressa Rainell BROCKS, DO  REFERRING DIAG: [phone] (ICD-10-CM) - Chronic left shoulder pain   THERAPY DIAG:  Chronic  left shoulder pain  Muscle weakness (generalized)  Abnormal posture  Rationale for Evaluation and Treatment: Rehabilitation  ONSET DATE: Chronic  SUBJECTIVE:                                                                                                                                                                                      SUBJECTIVE STATEMENT: Pt presents to PT with reports of continued severe L shoulder pain. Has been compliant with  initial HEP.   EVAL: Pt presents to PT with reports of chronic L shoulder pain and discomfort. Denies trauma or MOI, same discomfort used to be in the R shoulder but decreased after injection. Injection did not change any symptoms on L, has been gradually getting worse since January. No N/T or referral of pain outside of anterior L shoulder.  Hand dominance: Right  PERTINENT HISTORY: See PMH  PAIN:  Are you having pain?  Yes: NPRS scale: 8/10 Worst: 10/10 Pain location: L anterior shoulder Pain description: sharp Aggravating factors: OH reaching, lifting, styling hair Relieving factors: none  PRECAUTIONS: None  RED FLAGS: None   WEIGHT BEARING RESTRICTIONS: No  FALLS:  Has patient fallen in last 6 months? No  LIVING ENVIRONMENT: Lives with: lives with their family Lives in: House/apartment  OCCUPATION: Hair stylest   PLOF: Independent  PATIENT GOALS: decrease pain and improve functional ROM of L shoulder, be able to style hair without pain  NEXT MD VISIT: 07/28/2023  OBJECTIVE:  Note: Objective measures were completed at Evaluation unless otherwise noted.  DIAGNOSTIC FINDINGS:  See Imaging   PATIENT SURVEYS:  Quick DASH: 68% disability  COGNITION: Overall cognitive status: Within functional limits for tasks assessed     SENSATION: WFL  POSTURE: Rounded shoulders  UPPER EXTREMITY ROM:   Active ROM Right eval Left eval  Shoulder flexion 160 83  Shoulder extension    Shoulder abduction 170 70  Shoulder adduction    Shoulder internal rotation T8 L5  Shoulder external rotation 70 25  Elbow flexion    Elbow extension    Wrist flexion    Wrist extension    Wrist ulnar deviation    Wrist radial deviation    Wrist pronation    Wrist supination    (Blank rows = not tested)  UPPER EXTREMITY MMT:  MMT Right eval Left eval  Shoulder flexion    Shoulder extension    Shoulder abduction    Shoulder adduction    Shoulder internal rotation 5 3+   Shoulder external rotation 5 3 with p!  Middle trapezius    Lower trapezius    Elbow flexion    Elbow extension  Wrist flexion    Wrist extension    Wrist ulnar deviation    Wrist radial deviation    Wrist pronation    Wrist supination    Grip strength (lbs)    (Blank rows = not tested)  JOINT MOBILITY TESTING:  L GH hypomobility  PALPATION:  TTP to L infraspinatus, L biceps tendon                                                                                                                          TREATMENT: [facility] Adult PT Treatment:                                                DATE: 07/13/2023 UBE lvl 1.0 x 3 min for functional activity tolerance Pulleys L shoulder flexion x 2 min Standing row 2x10 blue band Standing ect 2x10 GTB Seated horizontal abd 2x10 YTB Seated bilateral 2x10 YTB S/L shoulder abd 2x10 L S/L ER 2x10 L Supine L shoulder flexion 2x10 L  [facility] Adult PT Treatment:                                                DATE: 07/06/2023 Therapeutic Exercise: L shoulder IR/ER isometric x 10 - 5 hold Row x 10 GTB L shoulder table slide x 5 - 5 hold Supine L shoulder flex - attempted, increased pain  PATIENT EDUCATION: Education details: HEP update Person educated: Patient Education method: Explanation, Demonstration, and Handouts Education comprehension: verbalized understanding and returned demonstration  HOME EXERCISE PROGRAM: Access Code: [email or web address] URL: [email or web address] Date: 07/13/2023 Prepared by: Alm Kingdom  Exercises - Standing Isometric Shoulder External Rotation with Doorway and Towel Roll  - 1 x daily - 7 x weekly - 2 sets - 10 reps - 5 sec hold - Standing Isometric Shoulder Internal Rotation with Towel Roll at Doorway  - 1 x daily - 7 x weekly - 2 sets - 10 reps - 5 sec hold - Standing Shoulder Row with Anchored Resistance  - 1 x daily - 7 x weekly - 3 sets - 10 reps - green band hold - Seated Shoulder Flexion Towel  Slide at Table Top  - 1 x daily - 7 x weekly - 2 sets - 10 reps - 5 sec hold - Seated Shoulder Horizontal Abduction with Resistance  - 1 x daily - 7 x weekly - 3 sets - 10 reps - yellow band hold - Shoulder External Rotation and Scapular Retraction with Resistance  - 1 x daily - 7 x weekly - 2 sets - 10 reps - yellow band hold  ASSESSMENT:  CLINICAL IMPRESSION: Pt was able to complete all prescribed exercises with no adverse effect but did  have continued pain in L shoulder. Exercises today focused on RTC and periscapular strengthening as well as improving functional ROM of L shoulder. HEP updated for continued strengthening. Continues to benefit from skilled PT, will progress as able.   EVAL: Patient is a 58 y.o. F who was seen today for physical therapy evaluation and treatment for chronic L shoulder pain. Physical findings are consistent with referring provider impression as pt demonstrates decrease in L shoulder ROM and RTC strength. Quick DASH score demonstrates 68% disability in performance of home ADLs and community activities. Pt would benefit from skilled PT services working on improving strength and ROM of L shoulder in order to improve function and decrease pain.   OBJECTIVE IMPAIRMENTS: decreased endurance, decreased mobility, decreased ROM, decreased strength, impaired UE functional use, postural dysfunction, and pain  ACTIVITY LIMITATIONS: carrying, lifting, bathing, toileting, dressing, and reach over head  PARTICIPATION LIMITATIONS: cleaning, laundry, driving, shopping, community activity, occupation, and yard work  PERSONAL FACTORS: Time since onset of injury/illness/exacerbation and 1 comorbidity: CHF are also affecting patient's functional outcome.   REHAB POTENTIAL: Good  CLINICAL DECISION MAKING: Stable/uncomplicated  EVALUATION COMPLEXITY: Low   GOALS: Goals reviewed with patient? No  SHORT TERM GOALS: Target date: 07/27/2023   Pt will be compliant and knowledgeable  with initial HEP for improved comfort and carryover Baseline: initial HEP given  Goal status: MET  2.  Pt will self report left shoulder pain no greater than 7/10 for improved comfort and functional ability Baseline: 10/10 at worst Goal status: INITIAL   LONG TERM GOALS: Target date: 08/31/2023   Pt will decrease Quick DASH disability score to no greater than 50% as proxy for functional improvement with home ADLs and community activities Baseline: 68%  Goal status: INITIAL  2.  Pt will self report left shoulder pain no greater than 3/10 for improved comfort and functional ability Baseline: 10/10 at worst Goal status: INITIAL   3.  Pt will improve L shoulder flexion AROM to at least 140 degrees for improved functional ability with home ADLs Baseline: 83 Goal status: INITIAL  4.  Pt will improve left shoulder IR to at least T10 for improved hygiene activities and decreased pain Baseline: L5 Goal status: INITIAL  5.  Pt will improve left shoulder IR/ER MMT to no less than 4/5 for improved comfort and dynamic stability Baseline: see MMT chart Goal status: INITIAL  PLAN:  PT FREQUENCY: 2x/week  PT DURATION: 8 weeks  PLANNED INTERVENTIONS: 97164- PT Re-evaluation, 97110-Therapeutic exercises, 97530- Therapeutic activity, 97112- Neuromuscular re-education, 97535- Self Care, 02859- Manual therapy, [id number]- Electrical stimulation (unattended), [id number]- Electrical stimulation (manual), 97016- Vasopneumatic device, 20560 (1-2 muscles), 20561 (3+ muscles)- Dry Needling, Cryotherapy, and Moist heat  PLAN FOR NEXT SESSION: assess HEP response, periscapular and RTC strengthening, improve shoulder ROM   Alm JAYSON Kingdom, PT 07/13/2023, 9:29 AM

## 2023-07-16 ENCOUNTER — Other Ambulatory Visit: Payer: Self-pay | Admitting: Student

## 2023-07-16 ENCOUNTER — Ambulatory Visit

## 2023-07-16 DIAGNOSIS — R293 Abnormal posture: Secondary | ICD-10-CM

## 2023-07-16 DIAGNOSIS — M25512 Pain in left shoulder: Secondary | ICD-10-CM | POA: Diagnosis not present

## 2023-07-16 DIAGNOSIS — M6281 Muscle weakness (generalized): Secondary | ICD-10-CM

## 2023-07-16 DIAGNOSIS — R7303 Prediabetes: Secondary | ICD-10-CM

## 2023-07-16 DIAGNOSIS — I10 Essential (primary) hypertension: Secondary | ICD-10-CM

## 2023-07-16 DIAGNOSIS — G8929 Other chronic pain: Secondary | ICD-10-CM

## 2023-07-16 NOTE — Progress Notes (Signed)
 Placing new order for sleep study to be performed at [facility]

## 2023-07-16 NOTE — Therapy (Signed)
 OUTPATIENT PHYSICAL THERAPY TREATMENT   Patient Name: [name] MRN: [id number] DOB:01-Jul-1965, 58 y.o., female Today's Date: 07/16/2023  END OF SESSION:  PT End of Session - 07/16/23 0827     Visit Number 3    Number of Visits 17    Date for PT Re-Evaluation 08/31/23    Authorization Type U[employer] MCD    PT Start Time 0845    PT Stop Time 0923    PT Time Calculation (min) 38 min    Activity Tolerance Patient tolerated treatment well    Behavior During Therapy [facility] for tasks assessed/performed            Past Medical History:  Diagnosis Date   Acute bilateral low back pain with right-sided sciatica 10/09/2014   Allergic rhinitis 05/11/2017   Allergy    Back pain 06/05/2021   Bilateral leg cramps 03/28/2020   Cervical cancer screening 04/20/2020   Cough due to angiotensin-converting enzyme inhibitor 05/27/2010   Diastolic heart failure 02/2010   NEW ON ECHO 02/2010   Diastolic heart failure (HCC) 11/29/2010   ECHO performed 09/09/10 Left ventricle: The cavity size was normal. There was mild     concentric hypertrophy. Systolic function was vigorous. The     estimated ejection fraction was in the range of 65% to 70%. Wall     motion was normal; there were no regional wall motion     abnormalities. Features are consistent with a pseudonormal left     ventricular filling pattern, with concomitant abnormal   DUB (dysfunctional uterine bleeding) 04/04/2011   03/2011 U/S:Small fibroids, one of which demonstrates mass effect upon the endometrium and could correlate with history of dysfunctional uterine bleeding. Endometrial Biopsy 07/2012 Secretory endometrium with no hyperplasia or carcinoma.  07/2012 pap smear negative for intraepithelial neoplasia. FSH on menopausal range LH, TSH, Prolactin and Testosterone  wnl.      Encounter for allergy testing 05/13/2020   Healthcare maintenance 04/29/2021   Hemoptysis 02/2010   DUE TO ALV HGE.SABRASABRAANA NEGATIVE...DEEMED DUE TO DIASTOLIC CHF   HEMOPTYSIS  UNSPECIFIED 03/28/2010   Followed in Pulmonary clinic/ [facility]/ [facility] med regimen --med calendar 10/10/2010  3 episodes all related to hypertension with diastolic chf - last august 2012. CXR clear 11/21/10     History of anemia 11/29/2010   Last Hb 10.1 on 08/21 baseline around 9.    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   PPD positive    NEGATIVE DURING CHILDHOOD IN [employer]1ST + TEST 1993 DURING IMMIGRATION GRREN CARD CLEARENCE.SABRACXR NORMAL 1993 PER HX..s/p INR Rx AT [facility]SABRAREPEAT + 02/2010   Right knee pain 05/06/2011   Seasonal and perennial allergic rhinoconjunctivitis 11/05/2020   Upper airway cough syndrome 03/28/2020   Vitamin B12 deficiency 09/25/2016   Vitamin D  deficiency 09/24/2016   Past Surgical History:  Procedure Laterality Date   CESAREAN SECTION     TUBAL LIGATION     Patient Active Problem List   Diagnosis Date Noted   Prediabetes 05/23/2022   Muscle strain of shoulder region, left, sequela 07/03/2021   Hyperlipidemia 09/24/2016   GERD (gastroesophageal reflux disease) 10/09/2014   Hyperthyroidism 11/29/2010   Essential hypertension 03/28/2010    PCP: Jennelle Riis, MD  REFERRING PROVIDER: Teressa Rainell BROCKS, DO  REFERRING DIAG: [phone] (ICD-10-CM) - Chronic left shoulder pain   THERAPY DIAG:  Chronic left shoulder pain  Muscle weakness (generalized)  Abnormal posture  Rationale for Evaluation and Treatment: Rehabilitation  ONSET DATE: Chronic  SUBJECTIVE:                                                                                                                                                                                      SUBJECTIVE STATEMENT: Pt presents to PT with some improvement in L shoulder, 7/10 today. Has been compliant with HEP.    EVAL: Pt presents to PT with reports of chronic L shoulder pain and discomfort. Denies trauma or MOI, same discomfort used to be in the R shoulder but decreased after injection. Injection did not change any symptoms on L, has been gradually getting worse since January. No N/T or referral of pain outside of anterior L shoulder.  Hand dominance: Right  PERTINENT HISTORY: See PMH  PAIN:  Are you having pain?  Yes: NPRS scale: 8/10 Worst: 10/10 Pain location: L anterior shoulder Pain description: sharp Aggravating factors: OH reaching, lifting, styling hair Relieving factors: none  PRECAUTIONS: None  RED FLAGS: None   WEIGHT BEARING RESTRICTIONS: No  FALLS:  Has patient fallen in last 6 months? No  LIVING ENVIRONMENT: Lives with: lives with their family Lives in: House/apartment  OCCUPATION: Hair stylest   PLOF: Independent  PATIENT GOALS: decrease pain and improve functional ROM of L shoulder, be able to style hair without pain  NEXT MD VISIT: 07/28/2023  OBJECTIVE:  Note: Objective measures were completed at Evaluation unless otherwise noted.  DIAGNOSTIC FINDINGS:  See Imaging   PATIENT SURVEYS:  Quick DASH: 68% disability  COGNITION: Overall cognitive status: Within functional limits for tasks assessed     SENSATION: WFL  POSTURE: Rounded shoulders  UPPER EXTREMITY ROM:   Active ROM Right eval Left eval  Shoulder flexion 160 83  Shoulder extension    Shoulder abduction 170 70  Shoulder adduction    Shoulder internal rotation T8 L5  Shoulder external rotation 70 25  Elbow flexion    Elbow extension    Wrist flexion    Wrist extension    Wrist ulnar deviation    Wrist radial deviation    Wrist pronation    Wrist supination    (Blank rows = not tested)  UPPER EXTREMITY MMT:  MMT Right eval Left eval  Shoulder flexion    Shoulder extension    Shoulder abduction    Shoulder adduction    Shoulder internal rotation 5 3+  Shoulder  external rotation 5 3 with p!  Middle trapezius    Lower trapezius    Elbow flexion    Elbow extension  Wrist flexion    Wrist extension    Wrist ulnar deviation    Wrist radial deviation    Wrist pronation    Wrist supination    Grip strength (lbs)    (Blank rows = not tested)  JOINT MOBILITY TESTING:  L GH hypomobility  PALPATION:  TTP to L infraspinatus, L biceps tendon                                                                                                                          TREATMENT: [facility] Adult PT Treatment:                                                DATE: 07/16/2023 UBE lvl 1.0 x 3 min for functional activity tolerance Pulleys L shoulder flexion x 2 min Supine L shoulder flexion 2x10 L Supine horizontal abd 2x10 YTB S/L shoulder abd 2x10 L S/L ER 2x10 L Seated bilateral ER 2x10 YTB FM row 2x10 13# FM ext 2x10 13# L shoulder ER 2x10 YTB  [facility] Adult PT Treatment:                                                DATE: 07/13/2023 UBE lvl 1.0 x 3 min for functional activity tolerance Pulleys L shoulder flexion x 2 min Standing row 2x10 blue band Standing ect 2x10 GTB Seated horizontal abd 2x10 YTB Seated bilateral 2x10 YTB S/L shoulder abd 2x10 L S/L ER 2x10 L Supine L shoulder flexion 2x10 L  [facility] Adult PT Treatment:                                                DATE: 07/06/2023 Therapeutic Exercise: L shoulder IR/ER isometric x 10 - 5 hold Row x 10 GTB L shoulder table slide x 5 - 5 hold Supine L shoulder flex - attempted, increased pain  PATIENT EDUCATION: Education details: HEP update Person educated: Patient Education method: Explanation, Demonstration, and Handouts Education comprehension: verbalized understanding and returned demonstration  HOME EXERCISE PROGRAM: Access Code: [email or web address] URL: [email or web address] Date: 07/13/2023 Prepared by: Alm Kingdom  Exercises - Standing Isometric Shoulder External Rotation  with Doorway and Towel Roll  - 1 x daily - 7 x weekly - 2 sets - 10 reps - 5 sec hold - Standing Isometric Shoulder Internal Rotation with Towel Roll at Doorway  - 1 x daily - 7 x weekly - 2 sets - 10 reps - 5 sec hold - Standing Shoulder Row with Anchored Resistance  - 1 x daily - 7 x weekly - 3  sets - 10 reps - green band hold - Seated Shoulder Flexion Towel Slide at Table Top  - 1 x daily - 7 x weekly - 2 sets - 10 reps - 5 sec hold - Seated Shoulder Horizontal Abduction with Resistance  - 1 x daily - 7 x weekly - 3 sets - 10 reps - yellow band hold - Shoulder External Rotation and Scapular Retraction with Resistance  - 1 x daily - 7 x weekly - 2 sets - 10 reps - yellow band hold  ASSESSMENT:  CLINICAL IMPRESSION: Pt was able to complete prescribed exercises with continued progression of activity, did have slight increase in soreness. Her ER continues to improve as well as supine and seated flexion. No changes to HEP. Will continue to progress as able per POC.   EVAL: Patient is a 58 y.o. F who was seen today for physical therapy evaluation and treatment for chronic L shoulder pain. Physical findings are consistent with referring provider impression as pt demonstrates decrease in L shoulder ROM and RTC strength. Quick DASH score demonstrates 68% disability in performance of home ADLs and community activities. Pt would benefit from skilled PT services working on improving strength and ROM of L shoulder in order to improve function and decrease pain.   OBJECTIVE IMPAIRMENTS: decreased endurance, decreased mobility, decreased ROM, decreased strength, impaired UE functional use, postural dysfunction, and pain  ACTIVITY LIMITATIONS: carrying, lifting, bathing, toileting, dressing, and reach over head  PARTICIPATION LIMITATIONS: cleaning, laundry, driving, shopping, community activity, occupation, and yard work  PERSONAL FACTORS: Time since onset of injury/illness/exacerbation and 1 comorbidity: CHF  are also affecting patient's functional outcome.   REHAB POTENTIAL: Good  CLINICAL DECISION MAKING: Stable/uncomplicated  EVALUATION COMPLEXITY: Low   GOALS: Goals reviewed with patient? No  SHORT TERM GOALS: Target date: 07/27/2023   Pt will be compliant and knowledgeable with initial HEP for improved comfort and carryover Baseline: initial HEP given  Goal status: MET  2.  Pt will self report left shoulder pain no greater than 7/10 for improved comfort and functional ability Baseline: 10/10 at worst Goal status: INITIAL   LONG TERM GOALS: Target date: 08/31/2023   Pt will decrease Quick DASH disability score to no greater than 50% as proxy for functional improvement with home ADLs and community activities Baseline: 68%  Goal status: INITIAL  2.  Pt will self report left shoulder pain no greater than 3/10 for improved comfort and functional ability Baseline: 10/10 at worst Goal status: INITIAL   3.  Pt will improve L shoulder flexion AROM to at least 140 degrees for improved functional ability with home ADLs Baseline: 83 Goal status: INITIAL  4.  Pt will improve left shoulder IR to at least T10 for improved hygiene activities and decreased pain Baseline: L5 Goal status: INITIAL  5.  Pt will improve left shoulder IR/ER MMT to no less than 4/5 for improved comfort and dynamic stability Baseline: see MMT chart Goal status: INITIAL  PLAN:  PT FREQUENCY: 2x/week  PT DURATION: 8 weeks  PLANNED INTERVENTIONS: 97164- PT Re-evaluation, 97110-Therapeutic exercises, 97530- Therapeutic activity, 97112- Neuromuscular re-education, 97535- Self Care, 02859- Manual therapy, [id number]- Electrical stimulation (unattended), [id number]- Electrical stimulation (manual), 97016- Vasopneumatic device, 20560 (1-2 muscles), 20561 (3+ muscles)- Dry Needling, Cryotherapy, and Moist heat  PLAN FOR NEXT SESSION: assess HEP response, periscapular and RTC strengthening, improve shoulder ROM   Alm JAYSON Kingdom, PT 07/16/2023, 9:23 AM

## 2023-07-21 ENCOUNTER — Ambulatory Visit: Attending: Family Medicine

## 2023-07-21 DIAGNOSIS — M6281 Muscle weakness (generalized): Secondary | ICD-10-CM | POA: Diagnosis present

## 2023-07-21 DIAGNOSIS — R293 Abnormal posture: Secondary | ICD-10-CM | POA: Diagnosis present

## 2023-07-21 DIAGNOSIS — G8929 Other chronic pain: Secondary | ICD-10-CM | POA: Diagnosis present

## 2023-07-21 DIAGNOSIS — M25512 Pain in left shoulder: Secondary | ICD-10-CM | POA: Insufficient documentation

## 2023-07-21 NOTE — Therapy (Signed)
 OUTPATIENT PHYSICAL THERAPY TREATMENT   Patient Name: [name] MRN: [id number] DOB:05-Aug-1965, 58 y.o., female Today's Date: 07/21/2023  END OF SESSION:  PT End of Session - 07/21/23 0843     Visit Number 4    Number of Visits 17    Date for PT Re-Evaluation 08/31/23    Authorization Type [employer] MCD    PT Start Time 0845    PT Stop Time 0925    PT Time Calculation (min) 40 min    Activity Tolerance Patient tolerated treatment well    Behavior During Therapy [facility] for tasks assessed/performed             Past Medical History:  Diagnosis Date   Acute bilateral low back pain with right-sided sciatica 10/09/2014   Allergic rhinitis 05/11/2017   Allergy    Back pain 06/05/2021   Bilateral leg cramps 03/28/2020   Cervical cancer screening 04/20/2020   Cough due to angiotensin-converting enzyme inhibitor 05/27/2010   Diastolic heart failure 02/2010   NEW ON ECHO 02/2010   Diastolic heart failure (HCC) 11/29/2010   ECHO performed 09/09/10 Left ventricle: The cavity size was normal. There was mild     concentric hypertrophy. Systolic function was vigorous. The     estimated ejection fraction was in the range of 65% to 70%. Wall     motion was normal; there were no regional wall motion     abnormalities. Features are consistent with a pseudonormal left     ventricular filling pattern, with concomitant abnormal   DUB (dysfunctional uterine bleeding) 04/04/2011   03/2011 U/S:Small fibroids, one of which demonstrates mass effect upon the endometrium and could correlate with history of dysfunctional uterine bleeding. Endometrial Biopsy 07/2012 Secretory endometrium with no hyperplasia or carcinoma.  07/2012 pap smear negative for intraepithelial neoplasia. FSH on menopausal range LH, TSH, Prolactin and Testosterone  wnl.      Encounter for allergy testing 05/13/2020   Healthcare maintenance 04/29/2021   Hemoptysis 02/2010   DUE TO ALV HGE.SABRASABRAANA NEGATIVE...DEEMED DUE TO DIASTOLIC CHF    HEMOPTYSIS UNSPECIFIED 03/28/2010   Followed in Pulmonary clinic/ [facility]/ [facility] med regimen --med calendar 10/10/2010  3 episodes all related to hypertension with diastolic chf - last august 2012. CXR clear 11/21/10     History of anemia 11/29/2010   Last Hb 10.1 on 08/21 baseline around 9.    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   PPD positive    NEGATIVE DURING CHILDHOOD IN [employer]1ST + TEST 1993 DURING IMMIGRATION GRREN CARD CLEARENCE.SABRACXR NORMAL 1993 PER HX..s/p INR Rx AT [facility]SABRAREPEAT + 02/2010   Right knee pain 05/06/2011   Seasonal and perennial allergic rhinoconjunctivitis 11/05/2020   Upper airway cough syndrome 03/28/2020   Vitamin B12 deficiency 09/25/2016   Vitamin D  deficiency 09/24/2016   Past Surgical History:  Procedure Laterality Date   CESAREAN SECTION     TUBAL LIGATION     Patient Active Problem List   Diagnosis Date Noted   Prediabetes 05/23/2022   Muscle strain of shoulder region, left, sequela 07/03/2021   Hyperlipidemia 09/24/2016   GERD (gastroesophageal reflux disease) 10/09/2014   Hyperthyroidism 11/29/2010   Essential hypertension 03/28/2010    PCP: Jennelle Riis, MD  REFERRING PROVIDER: Teressa Rainell BROCKS, DO  REFERRING DIAG: [phone] (ICD-10-CM) - Chronic left shoulder pain   THERAPY DIAG:  Chronic left shoulder pain  Muscle weakness (generalized)  Abnormal posture  Rationale for Evaluation and Treatment: Rehabilitation  ONSET DATE: Chronic  SUBJECTIVE:                                                                                                                                                                                      SUBJECTIVE STATEMENT: Pt presents to PT with reports of 8/10 L shoulder pain. Has been compliant with HEP.    EVAL: Pt presents to PT with reports of chronic L shoulder pain and discomfort. Denies trauma or MOI, same discomfort used to be in the R shoulder but decreased after injection. Injection did not change any symptoms on L, has been gradually getting worse since January. No N/T or referral of pain outside of anterior L shoulder.  Hand dominance: Right  PERTINENT HISTORY: See PMH  PAIN:  Are you having pain?  Yes: NPRS scale: 8/10 Worst: 10/10 Pain location: L anterior shoulder Pain description: sharp Aggravating factors: OH reaching, lifting, styling hair Relieving factors: none  PRECAUTIONS: None  RED FLAGS: None   WEIGHT BEARING RESTRICTIONS: No  FALLS:  Has patient fallen in last 6 months? No  LIVING ENVIRONMENT: Lives with: lives with their family Lives in: House/apartment  OCCUPATION: Hair stylest   PLOF: Independent  PATIENT GOALS: decrease pain and improve functional ROM of L shoulder, be able to style hair without pain  NEXT MD VISIT: 07/28/2023  OBJECTIVE:  Note: Objective measures were completed at Evaluation unless otherwise noted.  DIAGNOSTIC FINDINGS:  See Imaging   PATIENT SURVEYS:  Quick DASH: 68% disability  COGNITION: Overall cognitive status: Within functional limits for tasks assessed     SENSATION: WFL  POSTURE: Rounded shoulders  UPPER EXTREMITY ROM:   Active ROM Right eval Left eval  Shoulder flexion 160 83  Shoulder extension    Shoulder abduction 170 70  Shoulder adduction    Shoulder internal rotation T8 L5  Shoulder external rotation 70 25  Elbow flexion    Elbow extension    Wrist flexion    Wrist extension    Wrist ulnar deviation    Wrist radial deviation    Wrist pronation    Wrist supination    (Blank rows = not tested)  UPPER EXTREMITY MMT:  MMT Right eval Left eval  Shoulder flexion    Shoulder extension    Shoulder abduction    Shoulder adduction    Shoulder internal rotation 5 3+  Shoulder  external rotation 5 3 with p!  Middle trapezius    Lower trapezius    Elbow flexion    Elbow extension    Wrist  flexion    Wrist extension    Wrist ulnar deviation    Wrist radial deviation    Wrist pronation    Wrist supination    Grip strength (lbs)    (Blank rows = not tested)  JOINT MOBILITY TESTING:  L GH hypomobility  PALPATION:  TTP to L infraspinatus, L biceps tendon                                                                                                                          TREATMENT: [facility] Adult PT Treatment:                                                DATE: 07/21/2023 UBE lvl 1.0 x 3 min for functional activity tolerance Pulleys L shoulder flexion x 2 min Seated low row 2x10 25# L shoulder ER 2x10 YTB Supine horizontal abd 2x15 RTB Supine L shoulder flexion 2x10 L S/L shoulder abd 2x10 L 1# S/L ER x 10 L Seated bilateral ER 2x10 YTB FM row 2x10 13# FM ext 2x10 13#  [facility] Adult PT Treatment:                                                DATE: 07/16/2023 UBE lvl 1.0 x 3 min for functional activity tolerance Pulleys L shoulder flexion x 2 min Supine L shoulder flexion 2x10 L Supine horizontal abd 2x10 YTB S/L shoulder abd 2x10 L S/L ER 2x10 L Seated bilateral ER 2x10 YTB FM row 2x10 13# FM ext 2x10 13# L shoulder ER 2x10 YTB  [facility] Adult PT Treatment:                                                DATE: 07/13/2023 UBE lvl 1.0 x 3 min for functional activity tolerance Pulleys L shoulder flexion x 2 min Standing row 2x10 blue band Standing ect 2x10 GTB Seated horizontal abd 2x10 YTB Seated bilateral 2x10 YTB S/L shoulder abd 2x10 L S/L ER 2x10 L Supine L shoulder flexion 2x10 L  [facility] Adult PT Treatment:                                                DATE: 07/06/2023 Therapeutic Exercise: L shoulder IR/ER isometric x 10 - 5 hold Row x 10 GTB L shoulder table slide x 5 - 5 hold Supine L shoulder flex - attempted, increased pain  PATIENT  EDUCATION:  Education details: HEP update Person educated: Patient Education method: Explanation, Demonstration, and Handouts Education comprehension: verbalized understanding and returned demonstration  HOME EXERCISE PROGRAM: Access Code: [email or web address] URL: [email or web address] Date: 07/13/2023 Prepared by: Alm Kingdom  Exercises - Standing Isometric Shoulder External Rotation with Doorway and Towel Roll  - 1 x daily - 7 x weekly - 2 sets - 10 reps - 5 sec hold - Standing Isometric Shoulder Internal Rotation with Towel Roll at Doorway  - 1 x daily - 7 x weekly - 2 sets - 10 reps - 5 sec hold - Standing Shoulder Row with Anchored Resistance  - 1 x daily - 7 x weekly - 3 sets - 10 reps - green band hold - Seated Shoulder Flexion Towel Slide at Table Top  - 1 x daily - 7 x weekly - 2 sets - 10 reps - 5 sec hold - Seated Shoulder Horizontal Abduction with Resistance  - 1 x daily - 7 x weekly - 3 sets - 10 reps - yellow band hold - Shoulder External Rotation and Scapular Retraction with Resistance  - 1 x daily - 7 x weekly - 2 sets - 10 reps - yellow band hold  ASSESSMENT:  CLINICAL IMPRESSION: Pt was able to complete prescribed exercises with continued progression of activity, did have slight increase in soreness with ER. No changes to HEP. Will continue to progress as able per POC.   EVAL: Patient is a 58 y.o. F who was seen today for physical therapy evaluation and treatment for chronic L shoulder pain. Physical findings are consistent with referring provider impression as pt demonstrates decrease in L shoulder ROM and RTC strength. Quick DASH score demonstrates 68% disability in performance of home ADLs and community activities. Pt would benefit from skilled PT services working on improving strength and ROM of L shoulder in order to improve function and decrease pain.   OBJECTIVE IMPAIRMENTS: decreased endurance, decreased mobility, decreased ROM, decreased strength, impaired UE  functional use, postural dysfunction, and pain  ACTIVITY LIMITATIONS: carrying, lifting, bathing, toileting, dressing, and reach over head  PARTICIPATION LIMITATIONS: cleaning, laundry, driving, shopping, community activity, occupation, and yard work  PERSONAL FACTORS: Time since onset of injury/illness/exacerbation and 1 comorbidity: CHF are also affecting patient's functional outcome.   REHAB POTENTIAL: Good  CLINICAL DECISION MAKING: Stable/uncomplicated  EVALUATION COMPLEXITY: Low   GOALS: Goals reviewed with patient? No  SHORT TERM GOALS: Target date: 07/27/2023   Pt will be compliant and knowledgeable with initial HEP for improved comfort and carryover Baseline: initial HEP given  Goal status: MET  2.  Pt will self report left shoulder pain no greater than 7/10 for improved comfort and functional ability Baseline: 10/10 at worst Goal status: INITIAL   LONG TERM GOALS: Target date: 08/31/2023   Pt will decrease Quick DASH disability score to no greater than 50% as proxy for functional improvement with home ADLs and community activities Baseline: 68%  Goal status: INITIAL  2.  Pt will self report left shoulder pain no greater than 3/10 for improved comfort and functional ability Baseline: 10/10 at worst Goal status: INITIAL   3.  Pt will improve L shoulder flexion AROM to at least 140 degrees for improved functional ability with home ADLs Baseline: 83 Goal status: INITIAL  4.  Pt will improve left shoulder IR to at least T10 for improved hygiene activities and decreased pain Baseline: L5 Goal status: INITIAL  5.  Pt will improve left shoulder IR/ER MMT to no less than  4/5 for improved comfort and dynamic stability Baseline: see MMT chart Goal status: INITIAL  PLAN:  PT FREQUENCY: 2x/week  PT DURATION: 8 weeks  PLANNED INTERVENTIONS: 97164- PT Re-evaluation, 97110-Therapeutic exercises, 97530- Therapeutic activity, [id number]- Neuromuscular re-education, 97535- Self  Care, 02859- Manual therapy, [id number]- Electrical stimulation (unattended), [id number]- Electrical stimulation (manual), 97016- Vasopneumatic device, 20560 (1-2 muscles), 20561 (3+ muscles)- Dry Needling, Cryotherapy, and Moist heat  PLAN FOR NEXT SESSION: assess HEP response, periscapular and RTC strengthening, improve shoulder ROM   Alm JAYSON Kingdom, PT 07/21/2023, 9:45 AM

## 2023-07-23 ENCOUNTER — Ambulatory Visit

## 2023-07-23 DIAGNOSIS — M6281 Muscle weakness (generalized): Secondary | ICD-10-CM

## 2023-07-23 DIAGNOSIS — R293 Abnormal posture: Secondary | ICD-10-CM

## 2023-07-23 DIAGNOSIS — M25512 Pain in left shoulder: Secondary | ICD-10-CM | POA: Diagnosis not present

## 2023-07-23 DIAGNOSIS — G8929 Other chronic pain: Secondary | ICD-10-CM

## 2023-07-23 NOTE — Therapy (Signed)
 OUTPATIENT PHYSICAL THERAPY TREATMENT   Patient Name: [name] MRN: [id number] DOB:02/23/1965, 58 y.o., female Today's Date: 07/23/2023  END OF SESSION:  PT End of Session - 07/23/23 0844     Visit Number 5    Number of Visits 17    Date for PT Re-Evaluation 08/31/23    Authorization Type [employer] MCD    PT Start Time 0845    PT Stop Time 0923    PT Time Calculation (min) 38 min    Activity Tolerance Patient tolerated treatment well    Behavior During Therapy [facility] for tasks assessed/performed              Past Medical History:  Diagnosis Date   Acute bilateral low back pain with right-sided sciatica 10/09/2014   Allergic rhinitis 05/11/2017   Allergy    Back pain 06/05/2021   Bilateral leg cramps 03/28/2020   Cervical cancer screening 04/20/2020   Cough due to angiotensin-converting enzyme inhibitor 05/27/2010   Diastolic heart failure 02/2010   NEW ON ECHO 02/2010   Diastolic heart failure (HCC) 11/29/2010   ECHO performed 09/09/10 Left ventricle: The cavity size was normal. There was mild     concentric hypertrophy. Systolic function was vigorous. The     estimated ejection fraction was in the range of 65% to 70%. Wall     motion was normal; there were no regional wall motion     abnormalities. Features are consistent with a pseudonormal left     ventricular filling pattern, with concomitant abnormal   DUB (dysfunctional uterine bleeding) 04/04/2011   03/2011 U/S:Small fibroids, one of which demonstrates mass effect upon the endometrium and could correlate with history of dysfunctional uterine bleeding. Endometrial Biopsy 07/2012 Secretory endometrium with no hyperplasia or carcinoma.  07/2012 pap smear negative for intraepithelial neoplasia. FSH on menopausal range LH, TSH, Prolactin and Testosterone  wnl.      Encounter for allergy testing 05/13/2020   Healthcare maintenance 04/29/2021   Hemoptysis 02/2010   DUE TO ALV HGE.SABRASABRAANA NEGATIVE...DEEMED DUE TO DIASTOLIC CHF    HEMOPTYSIS UNSPECIFIED 03/28/2010   Followed in Pulmonary clinic/ [facility]/ [facility] med regimen --med calendar 10/10/2010  3 episodes all related to hypertension with diastolic chf - last august 2012. CXR clear 11/21/10     History of anemia 11/29/2010   Last Hb 10.1 on 08/21 baseline around 9.    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   PPD positive    NEGATIVE DURING CHILDHOOD IN [employer]1ST + TEST 1993 DURING IMMIGRATION GRREN CARD CLEARENCE.SABRACXR NORMAL 1993 PER HX..s/p INR Rx AT [facility].SABRAREPEAT + 02/2010   Right knee pain 05/06/2011   Seasonal and perennial allergic rhinoconjunctivitis 11/05/2020   Upper airway cough syndrome 03/28/2020   Vitamin B12 deficiency 09/25/2016   Vitamin D  deficiency 09/24/2016   Past Surgical History:  Procedure Laterality Date   CESAREAN SECTION     TUBAL LIGATION     Patient Active Problem List   Diagnosis Date Noted   Prediabetes 05/23/2022   Muscle strain of shoulder region, left, sequela 07/03/2021   Hyperlipidemia 09/24/2016   GERD (gastroesophageal reflux disease) 10/09/2014   Hyperthyroidism 11/29/2010   Essential hypertension 03/28/2010    PCP: Jennelle Riis, MD  REFERRING PROVIDER: Teressa Rainell BROCKS, DO  REFERRING DIAG: [phone] (ICD-10-CM) - Chronic left shoulder pain   THERAPY  DIAG:  Chronic left shoulder pain  Muscle weakness (generalized)  Abnormal posture  Rationale for Evaluation and Treatment: Rehabilitation  ONSET DATE: Chronic  SUBJECTIVE:                                                                                                                                                                                      SUBJECTIVE STATEMENT: Pt presents to PT with 7/10 L shoulder pain. Has been compliant with HEP.   EVAL: Pt  presents to PT with reports of chronic L shoulder pain and discomfort. Denies trauma or MOI, same discomfort used to be in the R shoulder but decreased after injection. Injection did not change any symptoms on L, has been gradually getting worse since January. No N/T or referral of pain outside of anterior L shoulder.  Hand dominance: Right  PERTINENT HISTORY: See PMH  PAIN:  Are you having pain?  Yes: NPRS scale: 8/10 Worst: 10/10 Pain location: L anterior shoulder Pain description: sharp Aggravating factors: OH reaching, lifting, styling hair Relieving factors: none  PRECAUTIONS: None  RED FLAGS: None   WEIGHT BEARING RESTRICTIONS: No  FALLS:  Has patient fallen in last 6 months? No  LIVING ENVIRONMENT: Lives with: lives with their family Lives in: House/apartment  OCCUPATION: Hair stylest   PLOF: Independent  PATIENT GOALS: decrease pain and improve functional ROM of L shoulder, be able to style hair without pain  NEXT MD VISIT: 07/28/2023  OBJECTIVE:  Note: Objective measures were completed at Evaluation unless otherwise noted.  DIAGNOSTIC FINDINGS:  See Imaging   PATIENT SURVEYS:  Quick DASH: 68% disability  COGNITION: Overall cognitive status: Within functional limits for tasks assessed     SENSATION: WFL  POSTURE: Rounded shoulders  UPPER EXTREMITY ROM:   Active ROM Right eval Left eval  Shoulder flexion 160 83  Shoulder extension    Shoulder abduction 170 70  Shoulder adduction    Shoulder internal rotation T8 L5  Shoulder external rotation 70 25  Elbow flexion    Elbow extension    Wrist flexion    Wrist extension    Wrist ulnar deviation    Wrist radial deviation    Wrist pronation    Wrist supination    (Blank rows = not tested)  UPPER EXTREMITY MMT:  MMT Right eval Left eval  Shoulder flexion    Shoulder extension    Shoulder abduction    Shoulder adduction    Shoulder internal rotation 5 3+  Shoulder external rotation  5 3 with p!  Middle trapezius    Lower trapezius    Elbow flexion    Elbow extension    Wrist  flexion    Wrist extension    Wrist ulnar deviation    Wrist radial deviation    Wrist pronation    Wrist supination    Grip strength (lbs)    (Blank rows = not tested)  JOINT MOBILITY TESTING:  L GH hypomobility  PALPATION:  TTP to L infraspinatus, L biceps tendon                                                                                                                          TREATMENT: [facility] Adult PT Treatment:                                                DATE: 07/23/2023 UBE lvl 1.0 x 3 min for functional activity tolerance Pulleys L shoulder flexion x 2 min Supine L shoulder flexion 2x10 2# L Supine horizontal abd 3x10 RTB Supine serratus punch 2x10 L Seated low row 2x10 25# Seated high row 2x10 25# supinated grip L shoulder ER isometric x 10 - 5 hold FM row 2x10 13# FM ext 2x10 13#  [facility] Adult PT Treatment:                                                DATE: 07/21/2023 UBE lvl 1.0 x 3 min for functional activity tolerance Pulleys L shoulder flexion x 2 min Seated low row 2x10 25# L shoulder ER 2x10 YTB Supine horizontal abd 2x15 RTB Supine L shoulder flexion 2x10 L S/L shoulder abd 2x10 L 1# S/L ER x 10 L Seated bilateral ER 2x10 YTB FM row 2x10 13# FM ext 2x10 13#  [facility] Adult PT Treatment:                                                DATE: 07/16/2023 UBE lvl 1.0 x 3 min for functional activity tolerance Pulleys L shoulder flexion x 2 min Supine L shoulder flexion 2x10 L Supine horizontal abd 2x10 YTB S/L shoulder abd 2x10 L S/L ER 2x10 L Seated bilateral ER 2x10 YTB FM row 2x10 13# FM ext 2x10 13# L shoulder ER 2x10 YTB  [facility] Adult PT Treatment:                                                DATE: 07/13/2023 UBE lvl 1.0 x 3 min for functional activity tolerance Pulleys L shoulder flexion x 2 min Standing row 2x10 blue band Standing  ect 2x10  GTB Seated horizontal abd 2x10 YTB Seated bilateral 2x10 YTB S/L shoulder abd 2x10 L S/L ER 2x10 L Supine L shoulder flexion 2x10 L  [facility] Adult PT Treatment:                                                DATE: 07/06/2023 Therapeutic Exercise: L shoulder IR/ER isometric x 10 - 5 hold Row x 10 GTB L shoulder table slide x 5 - 5 hold Supine L shoulder flex - attempted, increased pain  PATIENT EDUCATION: Education details: HEP update Person educated: Patient Education method: Explanation, Demonstration, and Handouts Education comprehension: verbalized understanding and returned demonstration  HOME EXERCISE PROGRAM: Access Code: [email or web address] URL: [email or web address] Date: 07/13/2023 Prepared by: Alm Kingdom  Exercises - Standing Isometric Shoulder External Rotation with Doorway and Towel Roll  - 1 x daily - 7 x weekly - 2 sets - 10 reps - 5 sec hold - Standing Isometric Shoulder Internal Rotation with Towel Roll at Doorway  - 1 x daily - 7 x weekly - 2 sets - 10 reps - 5 sec hold - Standing Shoulder Row with Anchored Resistance  - 1 x daily - 7 x weekly - 3 sets - 10 reps - green band hold - Seated Shoulder Flexion Towel Slide at Table Top  - 1 x daily - 7 x weekly - 2 sets - 10 reps - 5 sec hold - Seated Shoulder Horizontal Abduction with Resistance  - 1 x daily - 7 x weekly - 3 sets - 10 reps - yellow band hold - Shoulder External Rotation and Scapular Retraction with Resistance  - 1 x daily - 7 x weekly - 2 sets - 10 reps - yellow band hold  ASSESSMENT:  CLINICAL IMPRESSION: Pt was able to complete prescribed exercises with continued progression of activity, did have continued increase in soreness with ER, even with decrease to isometric. No changes to HEP. Will see physician on 07/27/2023, if they do an injection she will call to hold that appointment the next day. Otherwise will continue per POC.   EVAL: Patient is a 58 y.o. F who was seen today for physical  therapy evaluation and treatment for chronic L shoulder pain. Physical findings are consistent with referring provider impression as pt demonstrates decrease in L shoulder ROM and RTC strength. Quick DASH score demonstrates 68% disability in performance of home ADLs and community activities. Pt would benefit from skilled PT services working on improving strength and ROM of L shoulder in order to improve function and decrease pain.   OBJECTIVE IMPAIRMENTS: decreased endurance, decreased mobility, decreased ROM, decreased strength, impaired UE functional use, postural dysfunction, and pain  ACTIVITY LIMITATIONS: carrying, lifting, bathing, toileting, dressing, and reach over head  PARTICIPATION LIMITATIONS: cleaning, laundry, driving, shopping, community activity, occupation, and yard work  PERSONAL FACTORS: Time since onset of injury/illness/exacerbation and 1 comorbidity: CHF are also affecting patient's functional outcome.   REHAB POTENTIAL: Good  CLINICAL DECISION MAKING: Stable/uncomplicated  EVALUATION COMPLEXITY: Low   GOALS: Goals reviewed with patient? No  SHORT TERM GOALS: Target date: 07/27/2023   Pt will be compliant and knowledgeable with initial HEP for improved comfort and carryover Baseline: initial HEP given  Goal status: MET  2.  Pt will self report left shoulder pain no greater than 7/10 for improved comfort and functional ability Baseline:  10/10 at worst Goal status: INITIAL   LONG TERM GOALS: Target date: 08/31/2023   Pt will decrease Quick DASH disability score to no greater than 50% as proxy for functional improvement with home ADLs and community activities Baseline: 68%  Goal status: INITIAL  2.  Pt will self report left shoulder pain no greater than 3/10 for improved comfort and functional ability Baseline: 10/10 at worst Goal status: INITIAL   3.  Pt will improve L shoulder flexion AROM to at least 140 degrees for improved functional ability with home  ADLs Baseline: 83 Goal status: INITIAL  4.  Pt will improve left shoulder IR to at least T10 for improved hygiene activities and decreased pain Baseline: L5 Goal status: INITIAL  5.  Pt will improve left shoulder IR/ER MMT to no less than 4/5 for improved comfort and dynamic stability Baseline: see MMT chart Goal status: INITIAL  PLAN:  PT FREQUENCY: 2x/week  PT DURATION: 8 weeks  PLANNED INTERVENTIONS: 97164- PT Re-evaluation, 97110-Therapeutic exercises, 97530- Therapeutic activity, 97112- Neuromuscular re-education, 97535- Self Care, 02859- Manual therapy, [id number]- Electrical stimulation (unattended), [id number]- Electrical stimulation (manual), 97016- Vasopneumatic device, 20560 (1-2 muscles), 20561 (3+ muscles)- Dry Needling, Cryotherapy, and Moist heat  PLAN FOR NEXT SESSION: assess HEP response, periscapular and RTC strengthening, improve shoulder ROM   Alm JAYSON Kingdom, PT 07/23/2023, 9:23 AM

## 2023-07-27 ENCOUNTER — Encounter: Payer: Self-pay | Admitting: Family Medicine

## 2023-07-27 ENCOUNTER — Ambulatory Visit
Admission: RE | Admit: 2023-07-27 | Discharge: 2023-07-27 | Disposition: A | Discharge status: Home / Self Care | Source: Ambulatory Visit | Attending: Family Medicine | Admitting: Family Medicine

## 2023-07-27 ENCOUNTER — Ambulatory Visit (INDEPENDENT_AMBULATORY_CARE_PROVIDER_SITE_OTHER): Admitting: Family Medicine

## 2023-07-27 VITALS — BP 144/68 | Ht 65.0 in | Wt 236.0 lb

## 2023-07-27 DIAGNOSIS — I10 Essential (primary) hypertension: Secondary | ICD-10-CM | POA: Diagnosis not present

## 2023-07-27 DIAGNOSIS — G8929 Other chronic pain: Secondary | ICD-10-CM

## 2023-07-27 DIAGNOSIS — M65341 Trigger finger, right ring finger: Secondary | ICD-10-CM

## 2023-07-27 DIAGNOSIS — M25512 Pain in left shoulder: Secondary | ICD-10-CM

## 2023-07-27 MED ORDER — PREDNISONE 10 MG PO TABS: ORAL_TABLET | ORAL | 0 refills | Status: DC

## 2023-07-27 NOTE — Progress Notes (Signed)
 DATE OF VISIT: 07/27/2023        Jacquetta Hammans DOB: 17-Sep-1965 MRN: [id number]  CC: Follow-up right fourth finger trigger finger, left shoulder pain  History of present Illness: Everly Rubalcava is a 58 y.o. female who presents for a follow-up visit for right fourth finger trigger finger and left shoulder pain Last seen by me 06/18/2023, underwent right fourth finger trigger finger injection and left shoulder subacromial injection  RE: right fourth finger trigger finger Has been feeling better since the injection Still having some occasional triggering, but no longer having pain Overall feeling much better  RE: left shoulder No improvement after cortisone injection Has completed 5 sessions of physical therapy but still having significant pain No improvement with meloxicam  No improvement with Tylenol  Having trouble lifting her arm Having difficulty sleeping at night due to pain Denies any radiation of the pain No recent imaging  Medications:  Outpatient Encounter Medications as of 07/27/2023  Medication Sig   predniSONE  (DELTASONE ) 10 MG tablet Take as directed per MD instructions   albuterol  (VENTOLIN  HFA) 108 (90 Base) MCG/ACT inhaler Inhale 2 puffs into the lungs every 6 (six) hours as needed for wheezing or shortness of breath.   amLODipine  (NORVASC ) 10 MG tablet TAKE 1 TABLET BY MOUTH AT NIGHT   atorvastatin  (LIPITOR) 20 MG tablet Take 1 tablet (20 mg total) by mouth daily.   meloxicam  (MOBIC ) 15 MG tablet TAKE 1 TABLET(15 MG) BY MOUTH DAILY   metoprolol  tartrate (LOPRESSOR ) 25 MG tablet Take 1 tablet (25 mg total) by mouth 2 (two) times daily.   montelukast  (SINGULAIR ) 10 MG tablet Take 1 tablet (10 mg total) by mouth at bedtime.   [DISCONTINUED] azelastine  (ASTELIN ) 0.1 % nasal spray 2 sprays in each nostril twice a day as needed.  You may use this as needed for nasal congestion/itchy ears if desired (Patient not taking: Reported on 06/24/2023)   [DISCONTINUED] fluticasone  (FLONASE ) 50 MCG/ACT  nasal spray Place 2 sprays into both nostrils daily. (Patient not taking: Reported on 06/24/2023)   No facility-administered encounter medications on file as of 07/27/2023.    Allergies: is allergic to crestor  [rosuvastatin ].  Physical Examination: Vitals: BP (!) 144/68   Ht 5' 5 (1.651 m)   Wt 236 lb (107 kg)   LMP  (LMP Unknown) Comment: pt states 1.5 years ago   BMI 39.27 kg/m  GENERAL:  Glendia Olshefski is a 58 y.o. female appearing their stated age, alert and oriented x 3, in no apparent distress.  SKIN: no rashes or lesions, skin clean, dry, intact MSK: Shoulder: Left shoulder with decreased active range of motion in all planes, limited by 50% due to pain.  Near full passive range of motion in all planes with significant pain.  Tender to palpation along the bicipital groove.  No tenderness over the [facility] joint or the greater tuberosity.  Positive empty can, positive Hawkins, positive Neer, positive speeds, negative drop arm.  Rotator cuff strength 4 -/5 throughout and limited by pain. Right shoulder full range of motion without pain, weakness, instability  Hand: Right hand without any gross deformity.  No tenderness over the right fourth A1 pulley.  No palpable nodules.  No significant triggering of the fourth finger on exam today.  Normal grip strength.  No other abnormalities of the hand NEURO: sensation intact to light touch, DTR 2/4 bicep, tricep, brachial radialis bilaterally VASC: pulses 2+ and symmetric radial artery bilaterally, no edema  Assessment & Plan Chronic left shoulder pain Acute on chronic  left shoulder pain, no improvement with prior cortisone injection 06/18/2023, no improvement with 6+ weeks of oral NSAIDs, 6+ weeks of physical therapy and provider directed therapy.  Concern for rotator cuff tear versus other abnormality  Plan: - Imaging: Will obtain left shoulder x-ray for MRI planning.  Will also order left shoulder MRI to rule out rotator cuff tear.  Patient has failed  extensive conservative therapy and would be interested in surgical intervention if indicated - Rx prednisone  taper to take as directed for the next 6 days.  She take with food.  Should not take NSAIDs while on the prednisone .  Can resume her Mobic  after completing the oral prednisone  - Can continue PT as she is doing - Heat or ice as needed - Follow-up 1 week after MRI to review results Trigger finger, right ring finger Right fourth finger trigger finger, greatly improved status post cortisone injection 06/18/2023.  Still some occasional triggering, but no longer having pain  Plan: - At this time we will continue to monitor progress.  If has ongoing triggering or if has increasing pain would consider a second injection.  She is aware after a second injection if there is no improvement in the neck step would be to refer her to a hand surgeon Essential hypertension Elevated blood pressure in the office today  Plan: - She should continue her Norvasc  and Lopressor  as prescribed by PCP - Continue to follow-up with PCP as directed   Patient expressed understanding & agreement with above.  Encounter Diagnoses  Name Primary?   Chronic left shoulder pain Yes   Trigger finger, right ring finger    Essential hypertension     Orders Placed This Encounter  Procedures   DG Shoulder Left   MR SHOULDER LEFT WO CONTRAST

## 2023-07-27 NOTE — Assessment & Plan Note (Signed)
 Elevated blood pressure in the office today  Plan: - She should continue her Norvasc  and Lopressor  as prescribed by PCP - Continue to follow-up with PCP as directed

## 2023-07-27 NOTE — Telephone Encounter (Signed)
 Referral replaced and patient called.

## 2023-07-28 ENCOUNTER — Ambulatory Visit: Payer: Self-pay | Admitting: Family Medicine

## 2023-07-28 ENCOUNTER — Ambulatory Visit: Admitting: Family Medicine

## 2023-07-28 ENCOUNTER — Encounter: Payer: Self-pay | Admitting: Physical Therapy

## 2023-07-28 ENCOUNTER — Ambulatory Visit (INDEPENDENT_AMBULATORY_CARE_PROVIDER_SITE_OTHER): Admitting: Physical Therapy

## 2023-07-28 DIAGNOSIS — M6281 Muscle weakness (generalized): Secondary | ICD-10-CM

## 2023-07-28 DIAGNOSIS — M25512 Pain in left shoulder: Secondary | ICD-10-CM | POA: Diagnosis not present

## 2023-07-28 DIAGNOSIS — G8929 Other chronic pain: Secondary | ICD-10-CM

## 2023-07-28 NOTE — Therapy (Signed)
 OUTPATIENT PHYSICAL THERAPY TREATMENT   Patient Name: [name] MRN: [id number] DOB:09-Dec-1965, 58 y.o., female Today's Date: 07/28/2023  END OF SESSION:  PT End of Session - 07/28/23 0805     Visit Number 6    Number of Visits 17    Date for PT Re-Evaluation 08/31/23    Authorization Type [employer] MCD    PT Start Time 0800    PT Stop Time 0845    PT Time Calculation (min) 45 min              Past Medical History:  Diagnosis Date   Acute bilateral low back pain with right-sided sciatica 10/09/2014   Allergic rhinitis 05/11/2017   Allergy    Back pain 06/05/2021   Bilateral leg cramps 03/28/2020   Cervical cancer screening 04/20/2020   Cough due to angiotensin-converting enzyme inhibitor 05/27/2010   Diastolic heart failure 02/2010   NEW ON ECHO 02/2010   Diastolic heart failure (HCC) 11/29/2010   ECHO performed 09/09/10 Left ventricle: The cavity size was normal. There was mild     concentric hypertrophy. Systolic function was vigorous. The     estimated ejection fraction was in the range of 65% to 70%. Wall     motion was normal; there were no regional wall motion     abnormalities. Features are consistent with a pseudonormal left     ventricular filling pattern, with concomitant abnormal   DUB (dysfunctional uterine bleeding) 04/04/2011   03/2011 U/S:Small fibroids, one of which demonstrates mass effect upon the endometrium and could correlate with history of dysfunctional uterine bleeding. Endometrial Biopsy 07/2012 Secretory endometrium with no hyperplasia or carcinoma.  07/2012 pap smear negative for intraepithelial neoplasia. FSH on menopausal range LH, TSH, Prolactin and Testosterone  wnl.      Encounter for allergy testing 05/13/2020   Healthcare maintenance 04/29/2021   Hemoptysis 02/2010   DUE TO ALV HGE.SABRASABRAANA NEGATIVE...DEEMED DUE TO DIASTOLIC CHF   HEMOPTYSIS UNSPECIFIED 03/28/2010   Followed in Pulmonary clinic/ [facility]/ [facility] med regimen --med  calendar 10/10/2010  3 episodes all related to hypertension with diastolic chf - last august 2012. CXR clear 11/21/10     History of anemia 11/29/2010   Last Hb 10.1 on 08/21 baseline around 9.    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   PPD positive    NEGATIVE DURING CHILDHOOD IN [employer]1ST + TEST 1993 DURING IMMIGRATION GRREN CARD CLEARENCE.SABRACXR NORMAL 1993 PER HX..s/p INR Rx AT [facility].SABRAREPEAT + 02/2010   Right knee pain 05/06/2011   Seasonal and perennial allergic rhinoconjunctivitis 11/05/2020   Upper airway cough syndrome 03/28/2020   Vitamin B12 deficiency 09/25/2016   Vitamin D  deficiency 09/24/2016   Past Surgical History:  Procedure Laterality Date   CESAREAN SECTION     TUBAL LIGATION     Patient Active Problem List   Diagnosis Date Noted   Prediabetes 05/23/2022   Muscle strain of shoulder region, left, sequela 07/03/2021   Hyperlipidemia 09/24/2016   GERD (gastroesophageal reflux disease) 10/09/2014   Hyperthyroidism 11/29/2010   Essential hypertension 03/28/2010    PCP: Jennelle Riis, MD  REFERRING PROVIDER: Teressa Rainell BROCKS, DO  REFERRING DIAG: [phone] (ICD-10-CM) - Chronic left shoulder pain   THERAPY DIAG:  Chronic left shoulder pain  Muscle weakness (generalized)  Rationale for Evaluation and Treatment: Rehabilitation  ONSET  DATE: Chronic  SUBJECTIVE:                                                                                                                                                                                      SUBJECTIVE STATEMENT: Pt presents to PT with 8-9/10 L shoulder pain. Has been compliant with HEP. Saw MD who is ordering MRI (scheduled for 7/20) , did xray yesterday, waiting on results. Did not receive an injection at MD. He wants to wait for MRI.    EVAL: Pt presents to PT with reports of chronic L shoulder pain and discomfort. Denies trauma or MOI, same discomfort used to be in the R shoulder but decreased after injection. Injection did not change any symptoms on L, has been gradually getting worse since January. No N/T or referral of pain outside of anterior L shoulder.  Hand dominance: Right  PERTINENT HISTORY: See PMH  PAIN:  Are you having pain?  Yes: NPRS scale: 8-9/10 Worst: 10/10 Pain location: L anterior shoulder Pain description: sharp Aggravating factors: OH reaching, lifting, styling hair Relieving factors: none  PRECAUTIONS: None  RED FLAGS: None   WEIGHT BEARING RESTRICTIONS: No  FALLS:  Has patient fallen in last 6 months? No  LIVING ENVIRONMENT: Lives with: lives with their family Lives in: House/apartment  OCCUPATION: Hair stylest   PLOF: Independent  PATIENT GOALS: decrease pain and improve functional ROM of L shoulder, be able to style hair without pain  NEXT MD VISIT: 07/28/2023  OBJECTIVE:  Note: Objective measures were completed at Evaluation unless otherwise noted.  DIAGNOSTIC FINDINGS:  See Imaging   PATIENT SURVEYS:  Quick DASH: 68% disability  COGNITION: Overall cognitive status: Within functional limits for tasks assessed     SENSATION: WFL  POSTURE: Rounded shoulders  UPPER EXTREMITY ROM:   Active ROM Right eval Left eval Left 07/28/23  Shoulder flexion 160 83 90  Shoulder extension     Shoulder abduction 170 70 66  Shoulder adduction     Shoulder internal rotation T8 L5   Shoulder external rotation 70 25   Elbow flexion     Elbow extension     Wrist flexion     Wrist extension     Wrist ulnar deviation     Wrist radial deviation     Wrist pronation     Wrist supination     (Blank rows = not tested)  UPPER EXTREMITY MMT:  MMT Right eval Left eval  Shoulder flexion    Shoulder extension    Shoulder abduction    Shoulder adduction    Shoulder  internal rotation 5 3+  Shoulder external rotation 5 3 with  p!  Middle trapezius    Lower trapezius    Elbow flexion    Elbow extension    Wrist flexion    Wrist extension    Wrist ulnar deviation    Wrist radial deviation    Wrist pronation    Wrist supination    Grip strength (lbs)    (Blank rows = not tested)  JOINT MOBILITY TESTING:  L GH hypomobility  PALPATION:  TTP to L infraspinatus, L biceps tendon                                                                                                                          TREATMENT: [facility] Adult PT Treatment:                                                DATE: 07/28/23  UBE lvl 1.0 x 3 min for functional activity tolerance Pulleys L shoulder flexion x 2 min Supine L shoulder flexion 1x10, 1# 1 x 10 2# L Supine horizontal abd 3x10 RTB Supine serratus punch 2x10  bilateral  S/L shoulder abduction  Seated low row 2x10 25# Seated high row 2x10 25# supinated grip L shoulder ER isometric x 10 - 5 hold FM row 2x10 13# FM ext 2x10 13#   [facility] Adult PT Treatment:                                                DATE: 07/23/2023 UBE lvl 1.0 x 3 min for functional activity tolerance Pulleys L shoulder flexion x 2 min Supine L shoulder flexion 2x10 2# L Supine horizontal abd 3x10 RTB Supine serratus punch 2x10 L Seated low row 2x10 25# Seated high row 2x10 25# supinated grip L shoulder ER isometric x 10 - 5 hold FM row 2x10 13# FM ext 2x10 13#  [facility] Adult PT Treatment:                                                DATE: 07/21/2023 UBE lvl 1.0 x 3 min for functional activity tolerance Pulleys L shoulder flexion x 2 min Seated low row 2x10 25# L shoulder ER 2x10 YTB Supine horizontal abd 2x15 RTB Supine L shoulder flexion 2x10 L S/L shoulder abd 2x10 L 1# S/L ER x 10 L Seated bilateral ER 2x10 YTB FM row 2x10 13# FM ext 2x10 13#  [facility] Adult PT Treatment:  DATE: 07/16/2023 UBE  lvl 1.0 x 3 min for functional activity tolerance Pulleys L shoulder flexion x 2 min Supine L shoulder flexion 2x10 L Supine horizontal abd 2x10 YTB S/L shoulder abd 2x10 L S/L ER 2x10 L Seated bilateral ER 2x10 YTB FM row 2x10 13# FM ext 2x10 13# L shoulder ER 2x10 YTB  [facility] Adult PT Treatment:                                                DATE: 07/13/2023 UBE lvl 1.0 x 3 min for functional activity tolerance Pulleys L shoulder flexion x 2 min Standing row 2x10 blue band Standing ect 2x10 GTB Seated horizontal abd 2x10 YTB Seated bilateral 2x10 YTB S/L shoulder abd 2x10 L S/L ER 2x10 L Supine L shoulder flexion 2x10 L  [facility] Adult PT Treatment:                                                DATE: 07/06/2023 Therapeutic Exercise: L shoulder IR/ER isometric x 10 - 5 hold Row x 10 GTB L shoulder table slide x 5 - 5 hold Supine L shoulder flex - attempted, increased pain  PATIENT EDUCATION: Education details: HEP update Person educated: Patient Education method: Explanation, Demonstration, and Handouts Education comprehension: verbalized understanding and returned demonstration  HOME EXERCISE PROGRAM: Access Code: [email or web address] URL: [email or web address] Date: 07/13/2023 Prepared by: Alm Kingdom  Exercises - Standing Isometric Shoulder External Rotation with Doorway and Towel Roll  - 1 x daily - 7 x weekly - 2 sets - 10 reps - 5 sec hold - Standing Isometric Shoulder Internal Rotation with Towel Roll at Doorway  - 1 x daily - 7 x weekly - 2 sets - 10 reps - 5 sec hold - Standing Shoulder Row with Anchored Resistance  - 1 x daily - 7 x weekly - 3 sets - 10 reps - green band hold - Seated Shoulder Flexion Towel Slide at Table Top  - 1 x daily - 7 x weekly - 2 sets - 10 reps - 5 sec hold - Seated Shoulder Horizontal Abduction with Resistance  - 1 x daily - 7 x weekly - 3 sets - 10 reps - yellow band hold - Shoulder External Rotation and Scapular Retraction with  Resistance  - 1 x daily - 7 x weekly - 2 sets - 10 reps - yellow band hold  ASSESSMENT:  CLINICAL IMPRESSION: Pt was able to complete prescribed exercises with continued progression of activity, tolerated side lying ER with resistance today. ,  No changes to HEP. Saw physician on 07/27/2023, had an xray and is scheduled for MRI 7/20. Pt would like to continue PT at this time.  Min change in shoulder ROM since start. Limited by pain.   EVAL: Patient is a 58 y.o. F who was seen today for physical therapy evaluation and treatment for chronic L shoulder pain. Physical findings are consistent with referring provider impression as pt demonstrates decrease in L shoulder ROM and RTC strength. Quick DASH score demonstrates 68% disability in performance of home ADLs and community activities. Pt would benefit from skilled PT services working on improving strength and ROM of L shoulder in order to improve function and decrease  pain.   OBJECTIVE IMPAIRMENTS: decreased endurance, decreased mobility, decreased ROM, decreased strength, impaired UE functional use, postural dysfunction, and pain  ACTIVITY LIMITATIONS: carrying, lifting, bathing, toileting, dressing, and reach over head  PARTICIPATION LIMITATIONS: cleaning, laundry, driving, shopping, community activity, occupation, and yard work  PERSONAL FACTORS: Time since onset of injury/illness/exacerbation and 1 comorbidity: CHF are also affecting patient's functional outcome.   REHAB POTENTIAL: Good  CLINICAL DECISION MAKING: Stable/uncomplicated  EVALUATION COMPLEXITY: Low   GOALS: Goals reviewed with patient? No  SHORT TERM GOALS: Target date: 07/27/2023   Pt will be compliant and knowledgeable with initial HEP for improved comfort and carryover Baseline: initial HEP given  Goal status: MET  2.  Pt will self report left shoulder pain no greater than 7/10 for improved comfort and functional ability Baseline: 10/10 at worst Goal status: INITIAL    LONG TERM GOALS: Target date: 08/31/2023   Pt will decrease Quick DASH disability score to no greater than 50% as proxy for functional improvement with home ADLs and community activities Baseline: 68%  Goal status: INITIAL  2.  Pt will self report left shoulder pain no greater than 3/10 for improved comfort and functional ability Baseline: 10/10 at worst Goal status: INITIAL   3.  Pt will improve L shoulder flexion AROM to at least 140 degrees for improved functional ability with home ADLs Baseline: 83 Goal status: INITIAL  4.  Pt will improve left shoulder IR to at least T10 for improved hygiene activities and decreased pain Baseline: L5 Goal status: INITIAL  5.  Pt will improve left shoulder IR/ER MMT to no less than 4/5 for improved comfort and dynamic stability Baseline: see MMT chart Goal status: INITIAL  PLAN:  PT FREQUENCY: 2x/week  PT DURATION: 8 weeks  PLANNED INTERVENTIONS: 97164- PT Re-evaluation, 97110-Therapeutic exercises, 97530- Therapeutic activity, 97112- Neuromuscular re-education, 97535- Self Care, [id number]- Manual therapy, [id number]- Electrical stimulation (unattended), [id number]- Electrical stimulation (manual), 97016- Vasopneumatic device, 20560 (1-2 muscles), 20561 (3+ muscles)- Dry Needling, Cryotherapy, and Moist heat  PLAN FOR NEXT SESSION: assess HEP response, periscapular and RTC strengthening, improve shoulder ROM   Carita Harlene Brain, PTA 07/28/2023, 3:45 PM

## 2023-07-30 ENCOUNTER — Ambulatory Visit: Admitting: Physical Therapy

## 2023-07-30 ENCOUNTER — Encounter: Payer: Self-pay | Admitting: Physical Therapy

## 2023-07-30 DIAGNOSIS — M25512 Pain in left shoulder: Secondary | ICD-10-CM | POA: Diagnosis not present

## 2023-07-30 DIAGNOSIS — G8929 Other chronic pain: Secondary | ICD-10-CM

## 2023-07-30 DIAGNOSIS — M6281 Muscle weakness (generalized): Secondary | ICD-10-CM

## 2023-07-30 NOTE — Therapy (Signed)
 OUTPATIENT PHYSICAL THERAPY TREATMENT   Patient Name: [name] MRN: [id number] DOB:1965-02-17, 58 y.o., female Today's Date: 07/30/2023  END OF SESSION:  PT End of Session - 07/30/23 0802     Visit Number 7    Number of Visits 17    Date for PT Re-Evaluation 08/31/23    Authorization Type [employer] MCD    PT Start Time 0802    PT Stop Time 0840    PT Time Calculation (min) 38 min              Past Medical History:  Diagnosis Date   Acute bilateral low back pain with right-sided sciatica 10/09/2014   Allergic rhinitis 05/11/2017   Allergy    Back pain 06/05/2021   Bilateral leg cramps 03/28/2020   Cervical cancer screening 04/20/2020   Cough due to angiotensin-converting enzyme inhibitor 05/27/2010   Diastolic heart failure 02/2010   NEW ON ECHO 02/2010   Diastolic heart failure (HCC) 11/29/2010   ECHO performed 09/09/10 Left ventricle: The cavity size was normal. There was mild     concentric hypertrophy. Systolic function was vigorous. The     estimated ejection fraction was in the range of 65% to 70%. Wall     motion was normal; there were no regional wall motion     abnormalities. Features are consistent with a pseudonormal left     ventricular filling pattern, with concomitant abnormal   DUB (dysfunctional uterine bleeding) 04/04/2011   03/2011 U/S:Small fibroids, one of which demonstrates mass effect upon the endometrium and could correlate with history of dysfunctional uterine bleeding. Endometrial Biopsy 07/2012 Secretory endometrium with no hyperplasia or carcinoma.  07/2012 pap smear negative for intraepithelial neoplasia. FSH on menopausal range LH, TSH, Prolactin and Testosterone  wnl.      Encounter for allergy testing 05/13/2020   Healthcare maintenance 04/29/2021   Hemoptysis 02/2010   DUE TO ALV HGE.SABRASABRAANA NEGATIVE...DEEMED DUE TO DIASTOLIC CHF   HEMOPTYSIS UNSPECIFIED 03/28/2010   Followed in Pulmonary clinic/ [facility]/ [facility] med regimen --med  calendar 10/10/2010  3 episodes all related to hypertension with diastolic chf - last august 2012. CXR clear 11/21/10     History of anemia 11/29/2010   Last Hb 10.1 on 08/21 baseline around 9.    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   PPD positive    NEGATIVE DURING CHILDHOOD IN [employer]1ST + TEST 1993 DURING IMMIGRATION GRREN CARD CLEARENCE.SABRACXR NORMAL 1993 PER HX..s/p INR Rx AT [facility].SABRAREPEAT + 02/2010   Right knee pain 05/06/2011   Seasonal and perennial allergic rhinoconjunctivitis 11/05/2020   Upper airway cough syndrome 03/28/2020   Vitamin B12 deficiency 09/25/2016   Vitamin D  deficiency 09/24/2016   Past Surgical History:  Procedure Laterality Date   CESAREAN SECTION     TUBAL LIGATION     Patient Active Problem List   Diagnosis Date Noted   Prediabetes 05/23/2022   Muscle strain of shoulder region, left, sequela 07/03/2021   Hyperlipidemia 09/24/2016   GERD (gastroesophageal reflux disease) 10/09/2014   Hyperthyroidism 11/29/2010   Essential hypertension 03/28/2010    PCP: Jennelle Riis, MD  REFERRING PROVIDER: Teressa Rainell BROCKS, DO  REFERRING DIAG: [phone] (ICD-10-CM) - Chronic left shoulder pain   THERAPY DIAG:  Chronic left shoulder pain  Muscle weakness (generalized)  Rationale for Evaluation and Treatment: Rehabilitation  ONSET  DATE: Chronic  SUBJECTIVE:                                                                                                                                                                                      SUBJECTIVE STATEMENT: Pt presents to PT with 6/10 L shoulder pain. She feels it may be better due to oral steroid meds.   Saw MD who is ordering MRI (scheduled for 7/20) , did xray yesterday, waiting on results. Did not receive an injection at MD.  He wants to wait for MRI.   EVAL: Pt presents to PT with reports of chronic L shoulder pain and discomfort. Denies trauma or MOI, same discomfort used to be in the R shoulder but decreased after injection. Injection did not change any symptoms on L, has been gradually getting worse since January. No N/T or referral of pain outside of anterior L shoulder.  Hand dominance: Right  PERTINENT HISTORY: See PMH  PAIN:  Are you having pain?  Yes: NPRS scale: 6/10 Worst: 10/10 Pain location: L anterior shoulder Pain description: sharp Aggravating factors: OH reaching, lifting, styling hair Relieving factors: none  PRECAUTIONS: None  RED FLAGS: None   WEIGHT BEARING RESTRICTIONS: No  FALLS:  Has patient fallen in last 6 months? No  LIVING ENVIRONMENT: Lives with: lives with their family Lives in: House/apartment  OCCUPATION: Hair stylest   PLOF: Independent  PATIENT GOALS: decrease pain and improve functional ROM of L shoulder, be able to style hair without pain  NEXT MD VISIT: 07/28/2023  OBJECTIVE:  Note: Objective measures were completed at Evaluation unless otherwise noted.  DIAGNOSTIC FINDINGS:  See Imaging   PATIENT SURVEYS:  Quick DASH: 68% disability  COGNITION: Overall cognitive status: Within functional limits for tasks assessed     SENSATION: WFL  POSTURE: Rounded shoulders  UPPER EXTREMITY ROM:   Active ROM Right eval Left eval Left 07/28/23  Shoulder flexion 160 83 90  Shoulder extension     Shoulder abduction 170 70 66  Shoulder adduction     Shoulder internal rotation T8 L5   Shoulder external rotation 70 25   Elbow flexion     Elbow extension     Wrist flexion     Wrist extension     Wrist ulnar deviation     Wrist radial deviation     Wrist pronation     Wrist supination     (Blank rows = not tested)  UPPER EXTREMITY MMT:  MMT Right eval Left eval  Shoulder flexion    Shoulder extension    Shoulder abduction    Shoulder  adduction    Shoulder internal rotation 5  3+  Shoulder external rotation 5 3 with p!  Middle trapezius    Lower trapezius    Elbow flexion    Elbow extension    Wrist flexion    Wrist extension    Wrist ulnar deviation    Wrist radial deviation    Wrist pronation    Wrist supination    Grip strength (lbs)    (Blank rows = not tested)  JOINT MOBILITY TESTING:  L GH hypomobility  PALPATION:  TTP to L infraspinatus, L biceps tendon                                                                                                                          TREATMENT: [facility] Adult PT Treatment:                                                DATE: 07/30/23  UBE x 4 min L 1.5 Pulley Seated low row 2x10 25# Seated high row 2x10 25# supinated grip Supine L shoulder flexion  1 x 10 2# L Supine short arm flexion 3# 1 x 12 L Supine protraction 3# 1 x 10 L Supine horiz abdct 10 x 2 GTB  S/L shoulder abduction 1 x 10 AROM, 1 x 10 1#  S/L shoulder ER 1# 10 x 2  Scaption AROM seated x 10 L  Seated OH press AROM x 8 L     [facility] Adult PT Treatment:                                                DATE: 07/28/23  UBE lvl 1.0 x 3 min for functional activity tolerance Pulleys L shoulder flexion x 2 min Supine L shoulder flexion 1x10, 1# 1 x 10 2# L Supine horizontal abd 3x10 RTB Supine serratus punch 2x10  bilateral  S/L shoulder abduction  Seated low row 2x10 25# Seated high row 2x10 25# supinated grip L shoulder ER isometric x 10 - 5 hold FM row 2x10 13# FM ext 2x10 13#   [facility] Adult PT Treatment:                                                DATE: 07/23/2023 UBE lvl 1.0 x 3 min for functional activity tolerance Pulleys L shoulder flexion x 2 min Supine L shoulder flexion 2x10 2# L Supine horizontal abd 3x10 RTB Supine serratus punch 2x10 L Seated low row 2x10 25# Seated high row 2x10 25# supinated grip L shoulder ER isometric x 10 - 5 hold FM row 2x10 13# FM  ext 2x10 13#  [facility]  Adult PT Treatment:                                                DATE: 07/21/2023 UBE lvl 1.0 x 3 min for functional activity tolerance Pulleys L shoulder flexion x 2 min Seated low row 2x10 25# L shoulder ER 2x10 YTB Supine horizontal abd 2x15 RTB Supine L shoulder flexion 2x10 L S/L shoulder abd 2x10 L 1# S/L ER x 10 L Seated bilateral ER 2x10 YTB FM row 2x10 13# FM ext 2x10 13#  [facility] Adult PT Treatment:                                                DATE: 07/16/2023 UBE lvl 1.0 x 3 min for functional activity tolerance Pulleys L shoulder flexion x 2 min Supine L shoulder flexion 2x10 L Supine horizontal abd 2x10 YTB S/L shoulder abd 2x10 L S/L ER 2x10 L Seated bilateral ER 2x10 YTB FM row 2x10 13# FM ext 2x10 13# L shoulder ER 2x10 YTB  [facility] Adult PT Treatment:                                                DATE: 07/13/2023 UBE lvl 1.0 x 3 min for functional activity tolerance Pulleys L shoulder flexion x 2 min Standing row 2x10 blue band Standing ect 2x10 GTB Seated horizontal abd 2x10 YTB Seated bilateral 2x10 YTB S/L shoulder abd 2x10 L S/L ER 2x10 L Supine L shoulder flexion 2x10 L  [facility] Adult PT Treatment:                                                DATE: 07/06/2023 Therapeutic Exercise: L shoulder IR/ER isometric x 10 - 5 hold Row x 10 GTB L shoulder table slide x 5 - 5 hold Supine L shoulder flex - attempted, increased pain  PATIENT EDUCATION: Education details: HEP update Person educated: Patient Education method: Explanation, Demonstration, and Handouts Education comprehension: verbalized understanding and returned demonstration  HOME EXERCISE PROGRAM: Access Code: [email or web address] URL: [email or web address] Date: 07/13/2023 Prepared by: Alm Kingdom  Exercises - Standing Isometric Shoulder External Rotation with Doorway and Towel Roll  - 1 x daily - 7 x weekly - 2 sets - 10 reps - 5 sec hold - Standing Isometric Shoulder Internal Rotation  with Towel Roll at Doorway  - 1 x daily - 7 x weekly - 2 sets - 10 reps - 5 sec hold - Standing Shoulder Row with Anchored Resistance  - 1 x daily - 7 x weekly - 3 sets - 10 reps - green band hold - Seated Shoulder Flexion Towel Slide at Table Top  - 1 x daily - 7 x weekly - 2 sets - 10 reps - 5 sec hold - Seated Shoulder Horizontal Abduction with Resistance  - 1 x daily - 7 x weekly - 3 sets - 10 reps - yellow band hold -  Shoulder External Rotation and Scapular Retraction with Resistance  - 1 x daily - 7 x weekly - 2 sets - 10 reps - yellow band hold  ASSESSMENT:  CLINICAL IMPRESSION: Pt was able to complete prescribed exercises with continued progression of activity, tolerated seated AROM for scaption and over head press. Pain level lower today, could attribute to oral steroids.  No changes to HEP. Saw physician on 07/27/2023, had an xray and is scheduled for MRI 7/20. Pt would like to continue PT at this time.    EVAL: Patient is a 58 y.o. F who was seen today for physical therapy evaluation and treatment for chronic L shoulder pain. Physical findings are consistent with referring provider impression as pt demonstrates decrease in L shoulder ROM and RTC strength. Quick DASH score demonstrates 68% disability in performance of home ADLs and community activities. Pt would benefit from skilled PT services working on improving strength and ROM of L shoulder in order to improve function and decrease pain.   OBJECTIVE IMPAIRMENTS: decreased endurance, decreased mobility, decreased ROM, decreased strength, impaired UE functional use, postural dysfunction, and pain  ACTIVITY LIMITATIONS: carrying, lifting, bathing, toileting, dressing, and reach over head  PARTICIPATION LIMITATIONS: cleaning, laundry, driving, shopping, community activity, occupation, and yard work  PERSONAL FACTORS: Time since onset of injury/illness/exacerbation and 1 comorbidity: CHF are also affecting patient's functional outcome.    REHAB POTENTIAL: Good  CLINICAL DECISION MAKING: Stable/uncomplicated  EVALUATION COMPLEXITY: Low   GOALS: Goals reviewed with patient? No  SHORT TERM GOALS: Target date: 07/27/2023   Pt will be compliant and knowledgeable with initial HEP for improved comfort and carryover Baseline: initial HEP given  Goal status: MET  2.  Pt will self report left shoulder pain no greater than 7/10 for improved comfort and functional ability Baseline: 10/10 at worst Goal status: INITIAL   LONG TERM GOALS: Target date: 08/31/2023   Pt will decrease Quick DASH disability score to no greater than 50% as proxy for functional improvement with home ADLs and community activities Baseline: 68%  Goal status: INITIAL  2.  Pt will self report left shoulder pain no greater than 3/10 for improved comfort and functional ability Baseline: 10/10 at worst Goal status: INITIAL   3.  Pt will improve L shoulder flexion AROM to at least 140 degrees for improved functional ability with home ADLs Baseline: 83 Goal status: INITIAL  4.  Pt will improve left shoulder IR to at least T10 for improved hygiene activities and decreased pain Baseline: L5 Goal status: INITIAL  5.  Pt will improve left shoulder IR/ER MMT to no less than 4/5 for improved comfort and dynamic stability Baseline: see MMT chart Goal status: INITIAL  PLAN:  PT FREQUENCY: 2x/week  PT DURATION: 8 weeks  PLANNED INTERVENTIONS: 97164- PT Re-evaluation, 97110-Therapeutic exercises, 97530- Therapeutic activity, 97112- Neuromuscular re-education, 97535- Self Care, 02859- Manual therapy, [id number]- Electrical stimulation (unattended), [id number]- Electrical stimulation (manual), 97016- Vasopneumatic device, 20560 (1-2 muscles), 20561 (3+ muscles)- Dry Needling, Cryotherapy, and Moist heat  PLAN FOR NEXT SESSION: assess HEP response, periscapular and RTC strengthening, improve shoulder ROM   Harlene Persons, PTA 07/30/23 8:59 AM Phone:  [phone] Fax: [phone]

## 2023-08-03 ENCOUNTER — Ambulatory Visit: Admitting: Internal Medicine

## 2023-08-03 DIAGNOSIS — R0602 Shortness of breath: Secondary | ICD-10-CM

## 2023-08-03 LAB — PULMONARY FUNCTION TEST
DL/VA % pred: 113 %
DL/VA: 4.75 ml/min/mmHg/L
DLCO cor % pred: 87 %
DLCO cor: 18.27 ml/min/mmHg
DLCO unc % pred: 87 %
DLCO unc: 18.27 ml/min/mmHg
FEF 25-75 Post: 3.14 L/s
FEF 25-75 Pre: 3.23 L/s
FEF2575-%Change-Post: -2 %
FEF2575-%Pred-Post: 128 %
FEF2575-%Pred-Pre: 131 %
FEV1-%Change-Post: -1 %
FEV1-%Pred-Post: 82 %
FEV1-%Pred-Pre: 83 %
FEV1-Post: 2.2 L
FEV1-Pre: 2.25 L
FEV1FVC-%Change-Post: 0 %
FEV1FVC-%Pred-Pre: 111 %
FEV6-%Change-Post: -1 %
FEV6-%Pred-Post: 76 %
FEV6-%Pred-Pre: 77 %
FEV6-Post: 2.54 L
FEV6-Pre: 2.57 L
FEV6FVC-%Pred-Post: 103 %
FEV6FVC-%Pred-Pre: 103 %
FVC-%Change-Post: -1 %
FVC-%Pred-Post: 73 %
FVC-%Pred-Pre: 74 %
FVC-Post: 2.54 L
FVC-Pre: 2.57 L
Post FEV1/FVC ratio: 87 %
Post FEV6/FVC ratio: 100 %
Pre FEV1/FVC ratio: 87 %
Pre FEV6/FVC Ratio: 100 %
RV % pred: 92 %
RV: 1.86 L
TLC % pred: 87 %
TLC: 4.56 L

## 2023-08-03 NOTE — Patient Instructions (Signed)
 Full PFT performed today.

## 2023-08-03 NOTE — Progress Notes (Signed)
 Full PFT performed today.

## 2023-08-04 ENCOUNTER — Ambulatory Visit

## 2023-08-04 DIAGNOSIS — G8929 Other chronic pain: Secondary | ICD-10-CM

## 2023-08-04 DIAGNOSIS — M6281 Muscle weakness (generalized): Secondary | ICD-10-CM

## 2023-08-04 DIAGNOSIS — M25512 Pain in left shoulder: Secondary | ICD-10-CM | POA: Diagnosis not present

## 2023-08-04 DIAGNOSIS — R293 Abnormal posture: Secondary | ICD-10-CM

## 2023-08-04 NOTE — Therapy (Signed)
 OUTPATIENT PHYSICAL THERAPY TREATMENT   Patient Name: [name] MRN: [id number] DOB:[birth year], 58 y.o., female Today's Date: 08/04/2023  END OF SESSION:  PT End of Session - 08/04/23 0838     Visit Number 8    Number of Visits 17    Date for PT Re-Evaluation 08/31/23    Authorization Type [employer] MCD    PT Start Time 0845    PT Stop Time 0927    PT Time Calculation (min) 42 min               Past Medical History:  Diagnosis Date   Acute bilateral low back pain with right-sided sciatica 10/09/2014   Allergic rhinitis 05/11/2017   Allergy    Back pain 06/05/2021   Bilateral leg cramps 03/28/2020   Cervical cancer screening 04/20/2020   Cough due to angiotensin-converting enzyme inhibitor 05/27/2010   Diastolic heart failure 02/2010   NEW ON ECHO 02/2010   Diastolic heart failure (HCC) 11/29/2010   ECHO performed 09/09/10 Left ventricle: The cavity size was normal. There was mild     concentric hypertrophy. Systolic function was vigorous. The     estimated ejection fraction was in the range of 65% to 70%. Wall     motion was normal; there were no regional wall motion     abnormalities. Features are consistent with a pseudonormal left     ventricular filling pattern, with concomitant abnormal   DUB (dysfunctional uterine bleeding) 04/04/2011   03/2011 U/S:Small fibroids, one of which demonstrates mass effect upon the endometrium and could correlate with history of dysfunctional uterine bleeding. Endometrial Biopsy 07/2012 Secretory endometrium with no hyperplasia or carcinoma.  07/2012 pap smear negative for intraepithelial neoplasia. FSH on menopausal range LH, TSH, Prolactin and Testosterone  wnl.      Encounter for allergy testing 05/13/2020   Healthcare maintenance 04/29/2021   Hemoptysis 02/2010   DUE TO ALV HGE.SABRASABRAANA NEGATIVE...DEEMED DUE TO DIASTOLIC CHF   HEMOPTYSIS UNSPECIFIED 03/28/2010   Followed in Pulmonary clinic/ [facility]/ [facility] med regimen  --med calendar 10/10/2010  3 episodes all related to hypertension with diastolic chf - last august 2012. CXR clear 11/21/10     History of anemia 11/29/2010   Last Hb 10.1 on 08/21 baseline around 9.    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   PPD positive    NEGATIVE DURING CHILDHOOD IN [employer]1ST + TEST 1993 DURING IMMIGRATION GRREN CARD CLEARENCE.SABRACXR NORMAL 1993 PER HX..s/p INR Rx AT [facility].SABRAREPEAT + 02/2010   Right knee pain 05/06/2011   Seasonal and perennial allergic rhinoconjunctivitis 11/05/2020   Upper airway cough syndrome 03/28/2020   Vitamin B12 deficiency 09/25/2016   Vitamin D  deficiency 09/24/2016   Past Surgical History:  Procedure Laterality Date   CESAREAN SECTION     TUBAL LIGATION     Patient Active Problem List   Diagnosis Date Noted   Prediabetes 05/23/2022   Muscle strain of shoulder region, left, sequela 07/03/2021   Hyperlipidemia 09/24/2016   GERD (gastroesophageal reflux disease) 10/09/2014   Hyperthyroidism 11/29/2010   Essential hypertension 03/28/2010    PCP: Jennelle Riis, MD  REFERRING PROVIDER: Teressa Rainell BROCKS, DO  REFERRING DIAG: [phone] (ICD-10-CM) - Chronic left shoulder pain   THERAPY DIAG:  Chronic left shoulder pain  Muscle weakness (generalized)  Abnormal posture  Rationale for Evaluation and  Treatment: Rehabilitation  ONSET DATE: Chronic  SUBJECTIVE:                                                                                                                                                                                      SUBJECTIVE STATEMENT: Pt presents to PT with decreased L shoulder pain at 6/10, believes it is due to steroids she is taking. Has been compliant with HEP.   EVAL: Pt presents to PT with reports of chronic L shoulder pain  and discomfort. Denies trauma or MOI, same discomfort used to be in the R shoulder but decreased after injection. Injection did not change any symptoms on L, has been gradually getting worse since January. No N/T or referral of pain outside of anterior L shoulder.  Hand dominance: Right  PERTINENT HISTORY: See PMH  PAIN:  Are you having pain?  Yes: NPRS scale: 6/10 Worst: 10/10 Pain location: L anterior shoulder Pain description: sharp Aggravating factors: OH reaching, lifting, styling hair Relieving factors: none  PRECAUTIONS: None  RED FLAGS: None   WEIGHT BEARING RESTRICTIONS: No  FALLS:  Has patient fallen in last 6 months? No  LIVING ENVIRONMENT: Lives with: lives with their family Lives in: House/apartment  OCCUPATION: Hair stylest   PLOF: Independent  PATIENT GOALS: decrease pain and improve functional ROM of L shoulder, be able to style hair without pain  NEXT MD VISIT: 07/28/2023  OBJECTIVE:  Note: Objective measures were completed at Evaluation unless otherwise noted.  DIAGNOSTIC FINDINGS:  See Imaging   PATIENT SURVEYS:  Quick DASH: 68% disability  COGNITION: Overall cognitive status: Within functional limits for tasks assessed     SENSATION: WFL  POSTURE: Rounded shoulders  UPPER EXTREMITY ROM:   Active ROM Right eval Left eval Left 07/28/23  Shoulder flexion 160 83 90  Shoulder extension     Shoulder abduction 170 70 66  Shoulder adduction     Shoulder internal rotation T8 L5   Shoulder external rotation 70 25   Elbow flexion     Elbow extension     Wrist flexion     Wrist extension     Wrist ulnar deviation     Wrist radial deviation     Wrist pronation     Wrist supination     (Blank rows = not tested)  UPPER EXTREMITY MMT:  MMT Right eval Left eval  Shoulder flexion    Shoulder extension    Shoulder abduction    Shoulder adduction    Shoulder internal rotation 5 3+  Shoulder external rotation 5 3 with p!  Middle  trapezius    Lower trapezius    Elbow flexion  Elbow extension    Wrist flexion    Wrist extension    Wrist ulnar deviation    Wrist radial deviation    Wrist pronation    Wrist supination    Grip strength (lbs)    (Blank rows = not tested)  JOINT MOBILITY TESTING:  L GH hypomobility  PALPATION:  TTP to L infraspinatus, L biceps tendon                                                                                                                          TREATMENT: [facility] Adult PT Treatment:                                                DATE: 08/04/23 UBE x 3 min L 1.0 Pulley x 2 min into flexion FM row 2x12 13# FM ext 2x10 13# Supine shoulder flexion 2x10 2# L Supine horizontal abd 2x15 GTB Seated bilateral ER 2x10 YTB Seated low row 2x10 25# Seated high row 2x10 25# supinated grip  [facility] Adult PT Treatment:                                                DATE: 07/30/23  UBE x 4 min L 1.5 Pulley Seated low row 2x10 25# Seated high row 2x10 25# supinated grip Supine L shoulder flexion  1 x 10 2# L Supine short arm flexion 3# 1 x 12 L Supine protraction 3# 1 x 10 L Supine horiz abdct 10 x 2 GTB  S/L shoulder abduction 1 x 10 AROM, 1 x 10 1#  S/L shoulder ER 1# 10 x 2  Scaption AROM seated x 10 L  Seated OH press AROM x 8 L     [facility] Adult PT Treatment:                                                DATE: 07/28/23  UBE lvl 1.0 x 3 min for functional activity tolerance Pulleys L shoulder flexion x 2 min Supine L shoulder flexion 1x10, 1# 1 x 10 2# L Supine horizontal abd 3x10 RTB Supine serratus punch 2x10  bilateral  S/L shoulder abduction  Seated low row 2x10 25# Seated high row 2x10 25# supinated grip L shoulder ER isometric x 10 - 5 hold FM row 2x10 13# FM ext 2x10 13#   [facility] Adult PT Treatment:  DATE: 07/23/2023 UBE lvl 1.0 x 3 min for functional activity tolerance Pulleys L shoulder flexion x 2 min Supine L  shoulder flexion 2x10 2# L Supine horizontal abd 3x10 RTB Supine serratus punch 2x10 L Seated low row 2x10 25# Seated high row 2x10 25# supinated grip L shoulder ER isometric x 10 - 5 hold FM row 2x10 13# FM ext 2x10 13#  [facility] Adult PT Treatment:                                                DATE: 07/21/2023 UBE lvl 1.0 x 3 min for functional activity tolerance Pulleys L shoulder flexion x 2 min Seated low row 2x10 25# L shoulder ER 2x10 YTB Supine horizontal abd 2x15 RTB Supine L shoulder flexion 2x10 L S/L shoulder abd 2x10 L 1# S/L ER x 10 L Seated bilateral ER 2x10 YTB FM row 2x10 13# FM ext 2x10 13#  [facility] Adult PT Treatment:                                                DATE: 07/16/2023 UBE lvl 1.0 x 3 min for functional activity tolerance Pulleys L shoulder flexion x 2 min Supine L shoulder flexion 2x10 L Supine horizontal abd 2x10 YTB S/L shoulder abd 2x10 L S/L ER 2x10 L Seated bilateral ER 2x10 YTB FM row 2x10 13# FM ext 2x10 13# L shoulder ER 2x10 YTB  [facility] Adult PT Treatment:                                                DATE: 07/13/2023 UBE lvl 1.0 x 3 min for functional activity tolerance Pulleys L shoulder flexion x 2 min Standing row 2x10 blue band Standing ect 2x10 GTB Seated horizontal abd 2x10 YTB Seated bilateral 2x10 YTB S/L shoulder abd 2x10 L S/L ER 2x10 L Supine L shoulder flexion 2x10 L  [facility] Adult PT Treatment:                                                DATE: 07/06/2023 Therapeutic Exercise: L shoulder IR/ER isometric x 10 - 5 hold Row x 10 GTB L shoulder table slide x 5 - 5 hold Supine L shoulder flex - attempted, increased pain  PATIENT EDUCATION: Education details: HEP update Person educated: Patient Education method: Explanation, Demonstration, and Handouts Education comprehension: verbalized understanding and returned demonstration  HOME EXERCISE PROGRAM: Access Code: [email or web address] URL:  [email or web address] Date: 07/13/2023 Prepared by: Alm Kingdom  Exercises - Standing Isometric Shoulder External Rotation with Doorway and Towel Roll  - 1 x daily - 7 x weekly - 2 sets - 10 reps - 5 sec hold - Standing Isometric Shoulder Internal Rotation with Towel Roll at Doorway  - 1 x daily - 7 x weekly - 2 sets - 10 reps - 5 sec hold - Standing Shoulder Row with Anchored Resistance  - 1 x daily - 7 x weekly - 3  sets - 10 reps - green band hold - Seated Shoulder Flexion Towel Slide at Table Top  - 1 x daily - 7 x weekly - 2 sets - 10 reps - 5 sec hold - Seated Shoulder Horizontal Abduction with Resistance  - 1 x daily - 7 x weekly - 3 sets - 10 reps - yellow band hold - Shoulder External Rotation and Scapular Retraction with Resistance  - 1 x daily - 7 x weekly - 2 sets - 10 reps - yellow band hold  ASSESSMENT:  CLINICAL IMPRESSION: Pt was able to complete prescribed exercises with no adverse effect, with exception of increased pain and soreness with ER which has continued throughout care. No changes to HEP today. Shows slight improvement into flexion. MRI on 7/20, will continue to progress as able per POC.   EVAL: Patient is a 58 y.o. F who was seen today for physical therapy evaluation and treatment for chronic L shoulder pain. Physical findings are consistent with referring provider impression as pt demonstrates decrease in L shoulder ROM and RTC strength. Quick DASH score demonstrates 68% disability in performance of home ADLs and community activities. Pt would benefit from skilled PT services working on improving strength and ROM of L shoulder in order to improve function and decrease pain.   OBJECTIVE IMPAIRMENTS: decreased endurance, decreased mobility, decreased ROM, decreased strength, impaired UE functional use, postural dysfunction, and pain  ACTIVITY LIMITATIONS: carrying, lifting, bathing, toileting, dressing, and reach over head  PARTICIPATION LIMITATIONS:  cleaning, laundry, driving, shopping, community activity, occupation, and yard work  PERSONAL FACTORS: Time since onset of injury/illness/exacerbation and 1 comorbidity: CHF are also affecting patient's functional outcome.   REHAB POTENTIAL: Good  CLINICAL DECISION MAKING: Stable/uncomplicated  EVALUATION COMPLEXITY: Low   GOALS: Goals reviewed with patient? No  SHORT TERM GOALS: Target date: 07/27/2023   Pt will be compliant and knowledgeable with initial HEP for improved comfort and carryover Baseline: initial HEP given  Goal status: MET  2.  Pt will self report left shoulder pain no greater than 7/10 for improved comfort and functional ability Baseline: 10/10 at worst Goal status: INITIAL   LONG TERM GOALS: Target date: 08/31/2023   Pt will decrease Quick DASH disability score to no greater than 50% as proxy for functional improvement with home ADLs and community activities Baseline: 68%  Goal status: INITIAL  2.  Pt will self report left shoulder pain no greater than 3/10 for improved comfort and functional ability Baseline: 10/10 at worst Goal status: INITIAL   3.  Pt will improve L shoulder flexion AROM to at least 140 degrees for improved functional ability with home ADLs Baseline: 83 Goal status: INITIAL  4.  Pt will improve left shoulder IR to at least T10 for improved hygiene activities and decreased pain Baseline: L5 Goal status: INITIAL  5.  Pt will improve left shoulder IR/ER MMT to no less than 4/5 for improved comfort and dynamic stability Baseline: see MMT chart Goal status: INITIAL  PLAN:  PT FREQUENCY: 2x/week  PT DURATION: 8 weeks  PLANNED INTERVENTIONS: 97164- PT Re-evaluation, 97110-Therapeutic exercises, 97530- Therapeutic activity, [id number]- Neuromuscular re-education, 97535- Self Care, [id number]- Manual therapy, [id number]- Electrical stimulation (unattended), [id number]- Electrical stimulation (manual), 97016- Vasopneumatic device, 20560 (1-2 muscles), 20561  (3+ muscles)- Dry Needling, Cryotherapy, and Moist heat  PLAN FOR NEXT SESSION: assess HEP response, periscapular and RTC strengthening, improve shoulder ROM   Alm JAYSON Kingdom PT  08/04/23 9:56 AM

## 2023-08-05 ENCOUNTER — Ambulatory Visit: Payer: Self-pay | Admitting: Internal Medicine

## 2023-08-05 ENCOUNTER — Encounter (HOSPITAL_BASED_OUTPATIENT_CLINIC_OR_DEPARTMENT_OTHER): Payer: Self-pay | Admitting: Family Medicine

## 2023-08-05 ENCOUNTER — Ambulatory Visit (HOSPITAL_BASED_OUTPATIENT_CLINIC_OR_DEPARTMENT_OTHER): Admitting: Family Medicine

## 2023-08-05 VITALS — BP 138/72 | HR 62 | Ht 65.0 in | Wt 242.0 lb

## 2023-08-05 DIAGNOSIS — R350 Frequency of micturition: Secondary | ICD-10-CM

## 2023-08-05 DIAGNOSIS — R351 Nocturia: Secondary | ICD-10-CM

## 2023-08-05 LAB — POCT URINALYSIS DIP (CLINITEK)
Bilirubin, UA: NEGATIVE
Blood, UA: NEGATIVE
Glucose, UA: NEGATIVE mg/dL
Ketones, POC UA: NEGATIVE mg/dL
Leukocytes, UA: NEGATIVE
Nitrite, UA: NEGATIVE
POC PROTEIN,UA: NEGATIVE
Spec Grav, UA: 1.02 (ref 1.010–1.025)
Urobilinogen, UA: 0.2 U/dL
pH, UA: 6 (ref 5.0–8.0)

## 2023-08-05 MED ORDER — OXYBUTYNIN CHLORIDE ER 5 MG PO TB24: 5.0000 mg | ORAL_TABLET | Freq: Every day | ORAL | 3 refills | Status: AC

## 2023-08-05 NOTE — Progress Notes (Signed)
 New Patient Office Visit  Subjective:   [name] [birth year] 08/05/2023  Chief Complaint  Patient presents with   New Patient (Initial Visit)    Patient is here today to get established with the practice. States she has problems with sleeping due to having frequent urination at night.    HPI: [name] presents today to establish care at Primary Care and [facility] Medicine at [facility]. Introduced to Publishing rights manager role and practice setting.  All questions answered.   Last PCP: Penne Rhein, MD Last annual physical: May 2025  Concerns: See below    OVERACTIVE BLADDER:  Patient is a 58 yo female with hx of HTN, prediabetes who states she is getting up every night 5-6 x a night to void and states this is ongoing for 1 year.. She feels she is voiding a large amount each time she gets up. She does reports intermittent urgency. She denies dysuria, fevers, chills, back pain or abdominal pain. Denies constipation or diarrhea.  She reports limiting her fluid intake at nighttime.   She reports during the day she is drinking water during the day and states she has about 15-20 minutes after drinking water until she needs to void. She was recommended to have OSA study per previous PCP note. She has not heard back to schedule this yet. She denies frequent urination, frequent thirst or hunger during the day. Denies vaginal dryness, discharge or pain. Last A1C check was 06/10/2023 at 5.9.    The following portions of the patient's history were reviewed and updated as appropriate: past medical history, past surgical history, family history, social history, allergies, medications, and problem list.   Patient Active Problem List   Diagnosis Date Noted   Prediabetes 05/23/2022   Muscle strain of shoulder region, left, sequela 07/03/2021   Hyperlipidemia 09/24/2016   GERD (gastroesophageal reflux disease) 10/09/2014   Hyperthyroidism 11/29/2010   Essential hypertension  03/28/2010   Past Medical History:  Diagnosis Date   Acute bilateral low back pain with right-sided sciatica 10/09/2014   Allergic rhinitis 05/11/2017   Allergy    Back pain 06/05/2021   Bilateral leg cramps 03/28/2020   Cervical cancer screening 04/20/2020   Cough due to angiotensin-converting enzyme inhibitor 05/27/2010   Diastolic heart failure 02/2010   NEW ON ECHO 02/2010   Diastolic heart failure (HCC) 11/29/2010   ECHO performed 09/09/10 Left ventricle: The cavity size was normal. There was mild     concentric hypertrophy. Systolic function was vigorous. The     estimated ejection fraction was in the range of 65% to 70%. Wall     motion was normal; there were no regional wall motion     abnormalities. Features are consistent with a pseudonormal left     ventricular filling pattern, with concomitant abnormal   DUB (dysfunctional uterine bleeding) 04/04/2011   03/2011 U/S:Small fibroids, one of which demonstrates mass effect upon the endometrium and could correlate with history of dysfunctional uterine bleeding. Endometrial Biopsy 07/2012 Secretory endometrium with no hyperplasia or carcinoma.  07/2012 pap smear negative for intraepithelial neoplasia. FSH on menopausal range LH, TSH, Prolactin and Testosterone  wnl.      Encounter for allergy testing 05/13/2020   Healthcare maintenance 04/29/2021   Hemoptysis 02/2010   DUE TO ALV HGE.SABRASABRAANA NEGATIVE...DEEMED DUE TO DIASTOLIC CHF   HEMOPTYSIS UNSPECIFIED 03/28/2010   Followed in Pulmonary clinic/ [facility]/ [facility] med regimen --med calendar 10/10/2010  3 episodes all related to hypertension with diastolic chf -  last august 2012. CXR clear 11/21/10     History of anemia 11/29/2010   Last Hb 10.1 on 08/21 baseline around 9.    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   PPD positive     NEGATIVE DURING CHILDHOOD IN [employer]1ST + TEST 1993 DURING IMMIGRATION GRREN CARD CLEARENCE.SABRACXR NORMAL 1993 PER HX..s/p INR Rx AT [facility].SABRAREPEAT + 02/2010   Right knee pain 05/06/2011   Seasonal and perennial allergic rhinoconjunctivitis 11/05/2020   Upper airway cough syndrome 03/28/2020   Vitamin B12 deficiency 09/25/2016   Vitamin D  deficiency 09/24/2016   Past Surgical History:  Procedure Laterality Date   CESAREAN SECTION     TUBAL LIGATION     Family History  Problem Relation Age of Onset   Hypertension Father    Sudden death Father    Hyperlipidemia Neg Hx    Heart attack Neg Hx    Diabetes Neg Hx    Colon cancer Neg Hx    Colon polyps Neg Hx    Esophageal cancer Neg Hx    Stomach cancer Neg Hx    Rectal cancer Neg Hx    Asthma Neg Hx    Social History   Socioeconomic History   Marital status: Divorced    Spouse name: Not on file   Number of children: 5   Years of education: Not on file   Highest education level: Not on file  Occupational History   Occupation: Hair Stylist    Comment: works 4 days per week  Tobacco Use   Smoking status: Never    Passive exposure: Never   Smokeless tobacco: Never  Substance and Sexual Activity   Alcohol use: No   Drug use: No   Sexual activity: Yes    Birth control/protection: Surgical, Post-menopausal    Comment: BTL 58 years old  Other Topics Concern   Not on file  Social History Narrative   12/10/20- Has 5 children, her twin 29 year old boys live with her, patient is Muslim   Social Drivers of Corporate investment banker Strain: Low Risk  (12/10/2020)   Overall Financial Resource Strain (CARDIA)    Difficulty of Paying Living Expenses: Not hard at all  Food Insecurity: No Food Insecurity (12/10/2020)   Hunger Vital Sign    Worried About Running Out of Food in the Last Year: Never true    Ran Out of Food in the Last Year: Never true  Transportation Needs: No Transportation Needs  (12/10/2020)   PRAPARE - Administrator, Civil Service (Medical): No    Lack of Transportation (Non-Medical): No  Physical Activity: Sufficiently Active (12/10/2020)   Exercise Vital Sign    Days of Exercise per Week: 7 days    Minutes of Exercise per Session: 50 min  Stress: Not on file  Social Connections: Moderately Integrated (12/10/2020)   Social Connection and Isolation Panel    Frequency of Communication with Friends and Family: More than three times a week    Frequency of Social Gatherings with Friends and Family: More than three times a week    Attends Religious Services: More than 4 times per year    Active Member of [employer] or Organizations: Yes    Attends Engineer, structural: More than 4 times per year    Marital Status: Divorced  Intimate Partner Violence: Not on file   Outpatient Medications Prior to Visit  Medication Sig Dispense Refill   albuterol  (VENTOLIN  HFA) 108 (90 Base) MCG/ACT inhaler Inhale 2 puffs into the lungs every 6 (six) hours as needed for wheezing or shortness of breath. 8 g 2   amLODipine  (NORVASC ) 10 MG tablet TAKE 1 TABLET BY MOUTH AT NIGHT 90 tablet 1   atorvastatin  (LIPITOR) 20 MG tablet Take 1 tablet (20 mg total) by mouth daily. 90 tablet 3   meloxicam  (MOBIC ) 15 MG tablet TAKE 1 TABLET(15 MG) BY MOUTH DAILY 90 tablet 0   metoprolol  tartrate (LOPRESSOR ) 25 MG tablet Take 1 tablet (25 mg total) by mouth 2 (two) times daily. 180 tablet 3   montelukast  (SINGULAIR ) 10 MG tablet Take 1 tablet (10 mg total) by mouth at bedtime. 30 tablet 11   predniSONE  (DELTASONE ) 10 MG tablet Take as directed per MD instructions 21 tablet 0   No facility-administered medications prior to visit.   Allergies  Allergen Reactions   Crestor  [Rosuvastatin ] Other (See Comments)    Headache     ROS: A complete ROS was performed with pertinent positives/negatives noted in the HPI. The remainder of the ROS are negative.   Objective:   Today's Vitals    08/05/23 1400  BP: 138/72  Pulse: 62  SpO2: 99%  Weight: 242 lb (109.8 kg)  Height: 5' 5 (1.651 m)    GENERAL: Well-appearing, in NAD. Well nourished.  SKIN: Pink, warm and dry. No rash, lesion, ulceration, or ecchymoses.  Head: Normocephalic. NECK: Trachea midline. Full ROM w/o pain or tenderness.  RESPIRATORY: Chest wall symmetrical. Respirations even and non-labored.  MSK: Muscle tone and strength appropriate for age.  NEUROLOGIC: No motor or sensory deficits. Steady, even gait. C2-C12 intact.  PSYCH/MENTAL STATUS: Alert, oriented x 3. Cooperative, appropriate mood and affect.       Assessment & Plan:   1. Nocturia more than twice per night (Primary) 2. Urinary frequency No sign of UTI present on urinalysis in office. Prediabetes present but unlikely for diabetes insipidus. OSA may be contributing factor to nocturia. Will refer for OSA sleep study with [facility] and recommend she trial Ditropan  5mg  XL at nighttime. Discussed possible side effects and she verbalized understanding. Follow up in 6-8 weeks or sooner if needed.   - oxybutynin  (DITROPAN -XL) 5 MG 24 hr tablet; Take 1 tablet (5 mg total) by mouth at bedtime.  Dispense: 30 tablet; Refill: 3  - Ambulatory referral to Neurology - POCT URINALYSIS DIP (CLINITEK)    Patient to reach out to office if new, worrisome, or unresolved symptoms arise or if no improvement in patient's condition. Patient verbalized understanding and is agreeable to treatment plan. All questions answered to patient's satisfaction.    Return in about 2 months (around 10/06/2023) for Follow up Nocturia.    Thersia [name] Stark, [place]

## 2023-08-06 ENCOUNTER — Ambulatory Visit

## 2023-08-06 DIAGNOSIS — M6281 Muscle weakness (generalized): Secondary | ICD-10-CM

## 2023-08-06 DIAGNOSIS — M25512 Pain in left shoulder: Secondary | ICD-10-CM | POA: Diagnosis not present

## 2023-08-06 DIAGNOSIS — R293 Abnormal posture: Secondary | ICD-10-CM

## 2023-08-06 DIAGNOSIS — G8929 Other chronic pain: Secondary | ICD-10-CM

## 2023-08-06 NOTE — Therapy (Signed)
 OUTPATIENT PHYSICAL THERAPY TREATMENT   Patient Name: [name] MRN: [id number] DOB:1965-07-21, 58 y.o., female Today's Date: 08/06/2023  END OF SESSION:  PT End of Session - 08/06/23 0843     Visit Number 9    Number of Visits 17    Date for PT Re-Evaluation 08/31/23    Authorization Type [employer] MCD    PT Start Time 0845    PT Stop Time 0925    PT Time Calculation (min) 40 min                Past Medical History:  Diagnosis Date   Acute bilateral low back pain with right-sided sciatica 10/09/2014   Allergic rhinitis 05/11/2017   Allergy    Back pain 06/05/2021   Bilateral leg cramps 03/28/2020   Cervical cancer screening 04/20/2020   Cough due to angiotensin-converting enzyme inhibitor 05/27/2010   Diastolic heart failure 02/2010   NEW ON ECHO 02/2010   Diastolic heart failure (HCC) 11/29/2010   ECHO performed 09/09/10 Left ventricle: The cavity size was normal. There was mild     concentric hypertrophy. Systolic function was vigorous. The     estimated ejection fraction was in the range of 65% to 70%. Wall     motion was normal; there were no regional wall motion     abnormalities. Features are consistent with a pseudonormal left     ventricular filling pattern, with concomitant abnormal   DUB (dysfunctional uterine bleeding) 04/04/2011   03/2011 U/S:Small fibroids, one of which demonstrates mass effect upon the endometrium and could correlate with history of dysfunctional uterine bleeding. Endometrial Biopsy 07/2012 Secretory endometrium with no hyperplasia or carcinoma.  07/2012 pap smear negative for intraepithelial neoplasia. FSH on menopausal range LH, TSH, Prolactin and Testosterone  wnl.      Encounter for allergy testing 05/13/2020   Healthcare maintenance 04/29/2021   Hemoptysis 02/2010   DUE TO ALV HGE.SABRASABRAANA NEGATIVE...DEEMED DUE TO DIASTOLIC CHF   HEMOPTYSIS UNSPECIFIED 03/28/2010   Followed in Pulmonary clinic/ [facility]/ [facility] med regimen  --med calendar 10/10/2010  3 episodes all related to hypertension with diastolic chf - last august 2012. CXR clear 11/21/10     History of anemia 11/29/2010   Last Hb 10.1 on 08/21 baseline around 9.    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   PPD positive    NEGATIVE DURING CHILDHOOD IN [employer]1ST + TEST 1993 DURING IMMIGRATION GRREN CARD CLEARENCE.SABRACXR NORMAL 1993 PER HX..s/p INR Rx AT [facility].SABRAREPEAT + 02/2010   Right knee pain 05/06/2011   Seasonal and perennial allergic rhinoconjunctivitis 11/05/2020   Upper airway cough syndrome 03/28/2020   Vitamin B12 deficiency 09/25/2016   Vitamin D  deficiency 09/24/2016   Past Surgical History:  Procedure Laterality Date   CESAREAN SECTION     TUBAL LIGATION     Patient Active Problem List   Diagnosis Date Noted   Prediabetes 05/23/2022   Muscle strain of shoulder region, left, sequela 07/03/2021   Hyperlipidemia 09/24/2016   GERD (gastroesophageal reflux disease) 10/09/2014   Hyperthyroidism 11/29/2010   Essential hypertension 03/28/2010    PCP: Jennelle Riis, MD  REFERRING PROVIDER: Teressa Rainell BROCKS, DO  REFERRING DIAG: [id number] (ICD-10-CM) - Chronic left shoulder pain   THERAPY DIAG:  Chronic left shoulder pain  Muscle weakness (generalized)  Abnormal posture  Rationale for Evaluation  and Treatment: Rehabilitation  ONSET DATE: Chronic  SUBJECTIVE:                                                                                                                                                                                      SUBJECTIVE STATEMENT: Pt presents to PT with reports of 6/10 shoulder pain. Has been compliant with HEP.   EVAL: Pt presents to PT with reports of chronic L shoulder pain and discomfort. Denies trauma or MOI, same  discomfort used to be in the R shoulder but decreased after injection. Injection did not change any symptoms on L, has been gradually getting worse since January. No N/T or referral of pain outside of anterior L shoulder.  Hand dominance: Right  PERTINENT HISTORY: See PMH  PAIN:  Are you having pain?  Yes: NPRS scale: 6/10 Worst: 10/10 Pain location: L anterior shoulder Pain description: sharp Aggravating factors: OH reaching, lifting, styling hair Relieving factors: none  PRECAUTIONS: None  RED FLAGS: None   WEIGHT BEARING RESTRICTIONS: No  FALLS:  Has patient fallen in last 6 months? No  LIVING ENVIRONMENT: Lives with: lives with their family Lives in: House/apartment  OCCUPATION: Hair stylest   PLOF: Independent  PATIENT GOALS: decrease pain and improve functional ROM of L shoulder, be able to style hair without pain  NEXT MD VISIT: 07/28/2023  OBJECTIVE:  Note: Objective measures were completed at Evaluation unless otherwise noted.  DIAGNOSTIC FINDINGS:  See Imaging   PATIENT SURVEYS:  Quick DASH: 68% disability  COGNITION: Overall cognitive status: Within functional limits for tasks assessed     SENSATION: WFL  POSTURE: Rounded shoulders  UPPER EXTREMITY ROM:   Active ROM Right eval Left eval Left 07/28/23  Shoulder flexion 160 83 90  Shoulder extension     Shoulder abduction 170 70 66  Shoulder adduction     Shoulder internal rotation T8 L5   Shoulder external rotation 70 25   Elbow flexion     Elbow extension     Wrist flexion     Wrist extension     Wrist ulnar deviation     Wrist radial deviation     Wrist pronation     Wrist supination     (Blank rows = not tested)  UPPER EXTREMITY MMT:  MMT Right eval Left eval  Shoulder flexion    Shoulder extension    Shoulder abduction    Shoulder adduction    Shoulder internal rotation 5 3+  Shoulder external rotation 5 3 with p!  Middle trapezius    Lower trapezius    Elbow  flexion    Elbow extension    Wrist  flexion    Wrist extension    Wrist ulnar deviation    Wrist radial deviation    Wrist pronation    Wrist supination    Grip strength (lbs)    (Blank rows = not tested)  JOINT MOBILITY TESTING:  L GH hypomobility  PALPATION:  TTP to L infraspinatus, L biceps tendon                                                                                                                          TREATMENT: [facility] Adult PT Treatment:                                                DATE: 08/06/23 UBE x 4 min L 1.5 for functional activity tolerance Pulley x 2 min into flexion UE ranger into flexion 38 x 10 L FM row 2x10 17# FM ext 2x10 17# Supine shoulder flexion 3x10 L S/L ER 2x10 L  Supine serratus x 10 2# L  [facility] Adult PT Treatment:                                                DATE: 08/04/23 UBE x 3 min L 1.0 Pulley x 2 min into flexion FM row 2x12 13# FM ext 2x10 13# Supine shoulder flexion 2x10 2# L Supine horizontal abd 2x15 GTB Seated bilateral ER 2x10 YTB Seated low row 2x10 25# Seated high row 2x10 25# supinated grip  [facility] Adult PT Treatment:                                                DATE: 07/30/23  UBE x 4 min L 1.5 Pulley Seated low row 2x10 25# Seated high row 2x10 25# supinated grip Supine L shoulder flexion  1 x 10 2# L Supine short arm flexion 3# 1 x 12 L Supine protraction 3# 1 x 10 L Supine horiz abdct 10 x 2 GTB  S/L shoulder abduction 1 x 10 AROM, 1 x 10 1#  S/L shoulder ER 1# 10 x 2  Scaption AROM seated x 10 L  Seated OH press AROM x 8 L  [facility] Adult PT Treatment:                                                DATE: 07/28/23  UBE lvl 1.0 x 3 min for functional activity tolerance Pulleys L shoulder flexion x 2 min Supine  L shoulder flexion 1x10, 1# 1 x 10 2# L Supine horizontal abd 3x10 RTB Supine serratus punch 2x10  bilateral  S/L shoulder abduction  Seated low row 2x10 25# Seated high row 2x10 25# supinated  grip L shoulder ER isometric x 10 - 5 hold FM row 2x10 13# FM ext 2x10 13#   [facility] Adult PT Treatment:                                                DATE: 07/23/2023 UBE lvl 1.0 x 3 min for functional activity tolerance Pulleys L shoulder flexion x 2 min Supine L shoulder flexion 2x10 2# L Supine horizontal abd 3x10 RTB Supine serratus punch 2x10 L Seated low row 2x10 25# Seated high row 2x10 25# supinated grip L shoulder ER isometric x 10 - 5 hold FM row 2x10 13# FM ext 2x10 13#  [facility] Adult PT Treatment:                                                DATE: 07/21/2023 UBE lvl 1.0 x 3 min for functional activity tolerance Pulleys L shoulder flexion x 2 min Seated low row 2x10 25# L shoulder ER 2x10 YTB Supine horizontal abd 2x15 RTB Supine L shoulder flexion 2x10 L S/L shoulder abd 2x10 L 1# S/L ER x 10 L Seated bilateral ER 2x10 YTB FM row 2x10 13# FM ext 2x10 13#  [facility] Adult PT Treatment:                                                DATE: 07/16/2023 UBE lvl 1.0 x 3 min for functional activity tolerance Pulleys L shoulder flexion x 2 min Supine L shoulder flexion 2x10 L Supine horizontal abd 2x10 YTB S/L shoulder abd 2x10 L S/L ER 2x10 L Seated bilateral ER 2x10 YTB FM row 2x10 13# FM ext 2x10 13# L shoulder ER 2x10 YTB  [facility] Adult PT Treatment:                                                DATE: 07/13/2023 UBE lvl 1.0 x 3 min for functional activity tolerance Pulleys L shoulder flexion x 2 min Standing row 2x10 blue band Standing ect 2x10 GTB Seated horizontal abd 2x10 YTB Seated bilateral 2x10 YTB S/L shoulder abd 2x10 L S/L ER 2x10 L Supine L shoulder flexion 2x10 L  [facility] Adult PT Treatment:                                                DATE: 07/06/2023 Therapeutic Exercise: L shoulder IR/ER isometric x 10 - 5 hold Row x 10 GTB L shoulder table slide x 5 - 5 hold Supine L shoulder flex - attempted, increased pain  PATIENT EDUCATION: Education details:  HEP update Person educated: Patient  Education method: Explanation, Demonstration, and Handouts Education comprehension: verbalized understanding and returned demonstration  HOME EXERCISE PROGRAM: Access Code: [email or web address] URL: [email or web address] Date: 07/13/2023 Prepared by: Alm Kingdom  Exercises - Standing Isometric Shoulder External Rotation with Doorway and Towel Roll  - 1 x daily - 7 x weekly - 2 sets - 10 reps - 5 sec hold - Standing Isometric Shoulder Internal Rotation with Towel Roll at Doorway  - 1 x daily - 7 x weekly - 2 sets - 10 reps - 5 sec hold - Standing Shoulder Row with Anchored Resistance  - 1 x daily - 7 x weekly - 3 sets - 10 reps - green band hold - Seated Shoulder Flexion Towel Slide at Table Top  - 1 x daily - 7 x weekly - 2 sets - 10 reps - 5 sec hold - Seated Shoulder Horizontal Abduction with Resistance  - 1 x daily - 7 x weekly - 3 sets - 10 reps - yellow band hold - Shoulder External Rotation and Scapular Retraction with Resistance  - 1 x daily - 7 x weekly - 2 sets - 10 reps - yellow band hold  ASSESSMENT:  CLINICAL IMPRESSION: Pt was able to complete prescribed exercises with no adverse effect and improved tolerance today. Exercises focused on improving shoulder function and ROM. We will continue to progress as able awaiting upcoming MRI and doctor visit.   EVAL: Patient is a 58 y.o. F who was seen today for physical therapy evaluation and treatment for chronic L shoulder pain. Physical findings are consistent with referring provider impression as pt demonstrates decrease in L shoulder ROM and RTC strength. Quick DASH score demonstrates 68% disability in performance of home ADLs and community activities. Pt would benefit from skilled PT services working on improving strength and ROM of L shoulder in order to improve function and decrease pain.   OBJECTIVE IMPAIRMENTS: decreased endurance, decreased mobility, decreased ROM, decreased strength,  impaired UE functional use, postural dysfunction, and pain  ACTIVITY LIMITATIONS: carrying, lifting, bathing, toileting, dressing, and reach over head  PARTICIPATION LIMITATIONS: cleaning, laundry, driving, shopping, community activity, occupation, and yard work  PERSONAL FACTORS: Time since onset of injury/illness/exacerbation and 1 comorbidity: CHF are also affecting patient's functional outcome.   REHAB POTENTIAL: Good  CLINICAL DECISION MAKING: Stable/uncomplicated  EVALUATION COMPLEXITY: Low   GOALS: Goals reviewed with patient? No  SHORT TERM GOALS: Target date: 07/27/2023   Pt will be compliant and knowledgeable with initial HEP for improved comfort and carryover Baseline: initial HEP given  Goal status: MET  2.  Pt will self report left shoulder pain no greater than 7/10 for improved comfort and functional ability Baseline: 10/10 at worst Goal status: INITIAL   LONG TERM GOALS: Target date: 08/31/2023   Pt will decrease Quick DASH disability score to no greater than 50% as proxy for functional improvement with home ADLs and community activities Baseline: 68%  Goal status: INITIAL  2.  Pt will self report left shoulder pain no greater than 3/10 for improved comfort and functional ability Baseline: 10/10 at worst Goal status: INITIAL   3.  Pt will improve L shoulder flexion AROM to at least 140 degrees for improved functional ability with home ADLs Baseline: 83 Goal status: INITIAL  4.  Pt will improve left shoulder IR to at least T10 for improved hygiene activities and decreased pain Baseline: L5 Goal status: INITIAL  5.  Pt will improve left shoulder IR/ER MMT to no less than 4/5 for improved  comfort and dynamic stability Baseline: see MMT chart Goal status: INITIAL  PLAN:  PT FREQUENCY: 2x/week  PT DURATION: 8 weeks  PLANNED INTERVENTIONS: 97164- PT Re-evaluation, 97110-Therapeutic exercises, 97530- Therapeutic activity, [id number]- Neuromuscular re-education,  97535- Self Care, [id number]- Manual therapy, [id number]- Electrical stimulation (unattended), [id number]- Electrical stimulation (manual), 97016- Vasopneumatic device, 20560 (1-2 muscles), 20561 (3+ muscles)- Dry Needling, Cryotherapy, and Moist heat  PLAN FOR NEXT SESSION: assess HEP response, periscapular and RTC strengthening, improve shoulder ROM   Alm JAYSON Kingdom PT  08/06/23 9:31 AM

## 2023-08-09 ENCOUNTER — Ambulatory Visit
Admission: RE | Admit: 2023-08-09 | Discharge: 2023-08-09 | Disposition: A | Discharge status: Home / Self Care | Source: Ambulatory Visit | Attending: Family Medicine | Admitting: Family Medicine

## 2023-08-09 DIAGNOSIS — G8929 Other chronic pain: Secondary | ICD-10-CM

## 2023-08-12 ENCOUNTER — Ambulatory Visit

## 2023-08-12 DIAGNOSIS — R293 Abnormal posture: Secondary | ICD-10-CM

## 2023-08-12 DIAGNOSIS — M6281 Muscle weakness (generalized): Secondary | ICD-10-CM

## 2023-08-12 DIAGNOSIS — M25512 Pain in left shoulder: Secondary | ICD-10-CM | POA: Diagnosis not present

## 2023-08-12 DIAGNOSIS — G8929 Other chronic pain: Secondary | ICD-10-CM

## 2023-08-12 NOTE — Therapy (Signed)
 OUTPATIENT PHYSICAL THERAPY TREATMENT   Patient Name: [name] MRN: [id number] DOB:[birth year], 58 y.o., female Today's Date: 08/12/2023  END OF SESSION:  PT End of Session - 08/12/23 0844     Visit Number 10    Number of Visits 17    Date for PT Re-Evaluation 08/31/23    Authorization Type [employer] MCD    PT Start Time 0845    PT Stop Time 0925    PT Time Calculation (min) 40 min                 Past Medical History:  Diagnosis Date   Acute bilateral low back pain with right-sided sciatica 10/09/2014   Allergic rhinitis 05/11/2017   Allergy    Back pain 06/05/2021   Bilateral leg cramps 03/28/2020   Cervical cancer screening 04/20/2020   Cough due to angiotensin-converting enzyme inhibitor 05/27/2010   Diastolic heart failure 02/2010   NEW ON ECHO 02/2010   Diastolic heart failure (HCC) 11/29/2010   ECHO performed 09/09/10 Left ventricle: The cavity size was normal. There was mild     concentric hypertrophy. Systolic function was vigorous. The     estimated ejection fraction was in the range of 65% to 70%. Wall     motion was normal; there were no regional wall motion     abnormalities. Features are consistent with a pseudonormal left     ventricular filling pattern, with concomitant abnormal   DUB (dysfunctional uterine bleeding) 04/04/2011   03/2011 U/S:Small fibroids, one of which demonstrates mass effect upon the endometrium and could correlate with history of dysfunctional uterine bleeding. Endometrial Biopsy 07/2012 Secretory endometrium with no hyperplasia or carcinoma.  07/2012 pap smear negative for intraepithelial neoplasia. FSH on menopausal range LH, TSH, Prolactin and Testosterone  wnl.      Encounter for allergy testing 05/13/2020   Healthcare maintenance 04/29/2021   Hemoptysis 02/2010   DUE TO ALV HGE.SABRASABRAANA NEGATIVE...DEEMED DUE TO DIASTOLIC CHF   HEMOPTYSIS UNSPECIFIED 03/28/2010   Followed in Pulmonary clinic/ [facility]/ [facility] med regimen  --med calendar 10/10/2010  3 episodes all related to hypertension with diastolic chf - last august 2012. CXR clear 11/21/10     History of anemia 11/29/2010   Last Hb 10.1 on 08/21 baseline around 9.    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   PPD positive    NEGATIVE DURING CHILDHOOD IN [employer]1ST + TEST 1993 DURING IMMIGRATION GRREN CARD CLEARENCE.SABRACXR NORMAL 1993 PER HX..s/p INR Rx AT [facility].SABRAREPEAT + 02/2010   Right knee pain 05/06/2011   Seasonal and perennial allergic rhinoconjunctivitis 11/05/2020   Upper airway cough syndrome 03/28/2020   Vitamin B12 deficiency 09/25/2016   Vitamin D  deficiency 09/24/2016   Past Surgical History:  Procedure Laterality Date   CESAREAN SECTION     TUBAL LIGATION     Patient Active Problem List   Diagnosis Date Noted   Prediabetes 05/23/2022   Muscle strain of shoulder region, left, sequela 07/03/2021   Hyperlipidemia 09/24/2016   GERD (gastroesophageal reflux disease) 10/09/2014   Hyperthyroidism 11/29/2010   Essential hypertension 03/28/2010    PCP: Jennelle Riis, MD  REFERRING PROVIDER: Teressa Rainell BROCKS, DO  REFERRING DIAG: [phone] (ICD-10-CM) - Chronic left shoulder pain   THERAPY DIAG:  Chronic left shoulder pain  Muscle weakness (generalized)  Abnormal posture  Rationale for  Evaluation and Treatment: Rehabilitation  ONSET DATE: Chronic  SUBJECTIVE:                                                                                                                                                                                      SUBJECTIVE STATEMENT: Pt presents to PT with current 6/10 L shoulder pain. Has been fairly compliant with HEP. Waiting to go over MRI with doctor.   EVAL: Pt presents to PT with reports of chronic L shoulder pain and  discomfort. Denies trauma or MOI, same discomfort used to be in the R shoulder but decreased after injection. Injection did not change any symptoms on L, has been gradually getting worse since January. No N/T or referral of pain outside of anterior L shoulder.  Hand dominance: Right  PERTINENT HISTORY: See PMH  PAIN:  Are you having pain?  Yes: NPRS scale: 6/10 Worst: 10/10 Pain location: L anterior shoulder Pain description: sharp Aggravating factors: OH reaching, lifting, styling hair Relieving factors: none  PRECAUTIONS: None  RED FLAGS: None   WEIGHT BEARING RESTRICTIONS: No  FALLS:  Has patient fallen in last 6 months? No  LIVING ENVIRONMENT: Lives with: lives with their family Lives in: House/apartment  OCCUPATION: Hair stylest   PLOF: Independent  PATIENT GOALS: decrease pain and improve functional ROM of L shoulder, be able to style hair without pain  NEXT MD VISIT: 07/28/2023  OBJECTIVE:  Note: Objective measures were completed at Evaluation unless otherwise noted.  DIAGNOSTIC FINDINGS:  See Imaging   PATIENT SURVEYS:  Quick DASH: 68% disability  COGNITION: Overall cognitive status: Within functional limits for tasks assessed     SENSATION: WFL  POSTURE: Rounded shoulders  UPPER EXTREMITY ROM:   Active ROM Right eval Left eval Left 07/28/23 Left 08/12/23  Shoulder flexion 160 83 90 125  Shoulder extension      Shoulder abduction 170 70 66   Shoulder adduction      Shoulder internal rotation T8 L5    Shoulder external rotation 70 25    Elbow flexion      Elbow extension      Wrist flexion      Wrist extension      Wrist ulnar deviation      Wrist radial deviation      Wrist pronation      Wrist supination      (Blank rows = not tested)  UPPER EXTREMITY MMT:  MMT Right eval Left eval  Shoulder flexion    Shoulder extension    Shoulder abduction    Shoulder adduction    Shoulder internal rotation 5 3+  Shoulder external  rotation 5  3 with p!  Middle trapezius    Lower trapezius    Elbow flexion    Elbow extension    Wrist flexion    Wrist extension    Wrist ulnar deviation    Wrist radial deviation    Wrist pronation    Wrist supination    Grip strength (lbs)    (Blank rows = not tested)  JOINT MOBILITY TESTING:  L GH hypomobility  PALPATION:  TTP to L infraspinatus, L biceps tendon                                                                                                                          TREATMENT: [facility] Adult PT Treatment:                                                DATE: 08/12/23 UBE x 3 min L 1.5 for functional activity tolerance Pulley x 2 min into flexion UE ranger into flexion 38 x 10 L FM row 3x10 17# FM ext 2x10 17# Supine shoulder flexion 3x10 L 1# Supine horizontal abd 2x15 blue band Seated shoulder flex 2x10 L - to 90 degrees  [facility] Adult PT Treatment:                                                DATE: 08/06/23 UBE x 4 min L 1.5 for functional activity tolerance Pulley x 2 min into flexion UE ranger into flexion 38 x 10 L FM row 2x10 17# FM ext 2x10 17# Supine shoulder flexion 3x10 L S/L ER 2x10 L  Supine serratus x 10 2# L  [facility] Adult PT Treatment:                                                DATE: 08/04/23 UBE x 3 min L 1.0 Pulley x 2 min into flexion FM row 2x12 13# FM ext 2x10 13# Supine shoulder flexion 2x10 2# L Supine horizontal abd 2x15 GTB Seated bilateral ER 2x10 YTB Seated low row 2x10 25# Seated high row 2x10 25# supinated grip  [facility] Adult PT Treatment:                                                DATE: 07/30/23  UBE x 4 min L 1.5 Pulley Seated low row 2x10 25# Seated high row 2x10 25# supinated grip Supine L shoulder flexion  1 x 10 2#  L Supine short arm flexion 3# 1 x 12 L Supine protraction 3# 1 x 10 L Supine horiz abdct 10 x 2 GTB  S/L shoulder abduction 1 x 10 AROM, 1 x 10 1#  S/L shoulder ER 1# 10 x 2  [place] AROM seated  x 10 L  Seated OH press AROM x 8 L  [facility] Adult PT Treatment:                                                DATE: 07/28/23  UBE lvl 1.0 x 3 min for functional activity tolerance Pulleys L shoulder flexion x 2 min Supine L shoulder flexion 1x10, 1# 1 x 10 2# L Supine horizontal abd 3x10 RTB Supine serratus punch 2x10  bilateral  S/L shoulder abduction  Seated low row 2x10 25# Seated high row 2x10 25# supinated grip L shoulder ER isometric x 10 - 5 hold FM row 2x10 13# FM ext 2x10 13#   [facility] Adult PT Treatment:                                                DATE: 07/23/2023 UBE lvl 1.0 x 3 min for functional activity tolerance Pulleys L shoulder flexion x 2 min Supine L shoulder flexion 2x10 2# L Supine horizontal abd 3x10 RTB Supine serratus punch 2x10 L Seated low row 2x10 25# Seated high row 2x10 25# supinated grip L shoulder ER isometric x 10 - 5 hold FM row 2x10 13# FM ext 2x10 13#  [facility] Adult PT Treatment:                                                DATE: 07/21/2023 UBE lvl 1.0 x 3 min for functional activity tolerance Pulleys L shoulder flexion x 2 min Seated low row 2x10 25# L shoulder ER 2x10 YTB Supine horizontal abd 2x15 RTB Supine L shoulder flexion 2x10 L S/L shoulder abd 2x10 L 1# S/L ER x 10 L Seated bilateral ER 2x10 YTB FM row 2x10 13# FM ext 2x10 13#  [facility] Adult PT Treatment:                                                DATE: 07/16/2023 UBE lvl 1.0 x 3 min for functional activity tolerance Pulleys L shoulder flexion x 2 min Supine L shoulder flexion 2x10 L Supine horizontal abd 2x10 YTB S/L shoulder abd 2x10 L S/L ER 2x10 L Seated bilateral ER 2x10 YTB FM row 2x10 13# FM ext 2x10 13# L shoulder ER 2x10 YTB  [facility] Adult PT Treatment:                                                DATE: 07/13/2023 UBE lvl 1.0 x 3 min for functional activity tolerance Pulleys L shoulder flexion x  2 min Standing row 2x10 blue band Standing ect 2x10 GTB Seated  horizontal abd 2x10 YTB Seated bilateral 2x10 YTB S/L shoulder abd 2x10 L S/L ER 2x10 L Supine L shoulder flexion 2x10 L  [facility] Adult PT Treatment:                                                DATE: 07/06/2023 Therapeutic Exercise: L shoulder IR/ER isometric x 10 - 5 hold Row x 10 GTB L shoulder table slide x 5 - 5 hold Supine L shoulder flex - attempted, increased pain  PATIENT EDUCATION: Education details: HEP update Person educated: Patient Education method: Explanation, Demonstration, and Handouts Education comprehension: verbalized understanding and returned demonstration  HOME EXERCISE PROGRAM: Access Code: [email or web address] URL: [email or web address] Date: 07/13/2023 Prepared by: Alm Kingdom  Exercises - Standing Isometric Shoulder External Rotation with Doorway and Towel Roll  - 1 x daily - 7 x weekly - 2 sets - 10 reps - 5 sec hold - Standing Isometric Shoulder Internal Rotation with Towel Roll at Doorway  - 1 x daily - 7 x weekly - 2 sets - 10 reps - 5 sec hold - Standing Shoulder Row with Anchored Resistance  - 1 x daily - 7 x weekly - 3 sets - 10 reps - green band hold - Seated Shoulder Flexion Towel Slide at Table Top  - 1 x daily - 7 x weekly - 2 sets - 10 reps - 5 sec hold - Seated Shoulder Horizontal Abduction with Resistance  - 1 x daily - 7 x weekly - 3 sets - 10 reps - yellow band hold - Shoulder External Rotation and Scapular Retraction with Resistance  - 1 x daily - 7 x weekly - 2 sets - 10 reps - yellow band hold  ASSESSMENT:  CLINICAL IMPRESSION: Pt was able to complete all prescribed exercises with no adverse effect. She improved L shoulder flexion today and generally shows improving AROM. Pt is progressing with therapy, awaiting doctor to go over MRI results. Will otherwise continue to progress as able per POC.   EVAL: Patient is a 58 y.o. F who was seen today for physical therapy evaluation and treatment for chronic L shoulder pain. Physical  findings are consistent with referring provider impression as pt demonstrates decrease in L shoulder ROM and RTC strength. Quick DASH score demonstrates 68% disability in performance of home ADLs and community activities. Pt would benefit from skilled PT services working on improving strength and ROM of L shoulder in order to improve function and decrease pain.   OBJECTIVE IMPAIRMENTS: decreased endurance, decreased mobility, decreased ROM, decreased strength, impaired UE functional use, postural dysfunction, and pain  ACTIVITY LIMITATIONS: carrying, lifting, bathing, toileting, dressing, and reach over head  PARTICIPATION LIMITATIONS: cleaning, laundry, driving, shopping, community activity, occupation, and yard work  PERSONAL FACTORS: Time since onset of injury/illness/exacerbation and 1 comorbidity: CHF are also affecting patient's functional outcome.   REHAB POTENTIAL: Good  CLINICAL DECISION MAKING: Stable/uncomplicated  EVALUATION COMPLEXITY: Low   GOALS: Goals reviewed with patient? No  SHORT TERM GOALS: Target date: 07/27/2023   Pt will be compliant and knowledgeable with initial HEP for improved comfort and carryover Baseline: initial HEP given  Goal status: MET  2.  Pt will self report left shoulder pain no greater than 7/10 for improved comfort and functional ability Baseline: 10/10 at  worst Goal status: IN PROGRESS   LONG TERM GOALS: Target date: 08/31/2023   Pt will decrease Quick DASH disability score to no greater than 50% as proxy for functional improvement with home ADLs and community activities Baseline: 68%  Goal status: INITIAL  2.  Pt will self report left shoulder pain no greater than 3/10 for improved comfort and functional ability Baseline: 10/10 at worst Goal status: IN PROGRESS   3.  Pt will improve L shoulder flexion AROM to at least 140 degrees for improved functional ability with home ADLs Baseline: 83 Goal status: IN PROGRESS  4.  Pt will improve  left shoulder IR to at least T10 for improved hygiene activities and decreased pain Baseline: L5 Goal status: INITIAL  5.  Pt will improve left shoulder IR/ER MMT to no less than 4/5 for improved comfort and dynamic stability Baseline: see MMT chart Goal status: INITIAL  PLAN:  PT FREQUENCY: 2x/week  PT DURATION: 8 weeks  PLANNED INTERVENTIONS: 97164- PT Re-evaluation, 97110-Therapeutic exercises, 97530- Therapeutic activity, [id number]- Neuromuscular re-education, 97535- Self Care, [id number]- Manual therapy, [id number]- Electrical stimulation (unattended), [id number]- Electrical stimulation (manual), 97016- Vasopneumatic device, 20560 (1-2 muscles), 20561 (3+ muscles)- Dry Needling, Cryotherapy, and Moist heat  PLAN FOR NEXT SESSION: assess HEP response, periscapular and RTC strengthening, improve shoulder ROM   Alm BROCKS Dorothymae Maciver PT  08/12/23 9:30 AM

## 2023-08-17 ENCOUNTER — Ambulatory Visit

## 2023-08-17 DIAGNOSIS — M6281 Muscle weakness (generalized): Secondary | ICD-10-CM

## 2023-08-17 DIAGNOSIS — G8929 Other chronic pain: Secondary | ICD-10-CM

## 2023-08-17 DIAGNOSIS — M25512 Pain in left shoulder: Secondary | ICD-10-CM | POA: Diagnosis not present

## 2023-08-17 NOTE — Therapy (Signed)
 OUTPATIENT PHYSICAL THERAPY TREATMENT   Patient Name: [name] MRN: [id number] DOB:December 05, 1965, 58 y.o., female Today's Date: 08/17/2023  END OF SESSION:  PT End of Session - 08/17/23 0848     Visit Number 11    Number of Visits 17    Date for PT Re-Evaluation 08/31/23    Authorization Type [employer] MCD    PT Start Time 0846    PT Stop Time 0927    PT Time Calculation (min) 41 min                  Past Medical History:  Diagnosis Date   Acute bilateral low back pain with right-sided sciatica 10/09/2014   Allergic rhinitis 05/11/2017   Allergy    Back pain 06/05/2021   Bilateral leg cramps 03/28/2020   Cervical cancer screening 04/20/2020   Cough due to angiotensin-converting enzyme inhibitor 05/27/2010   Diastolic heart failure 02/2010   NEW ON ECHO 02/2010   Diastolic heart failure (HCC) 11/29/2010   ECHO performed 09/09/10 Left ventricle: The cavity size was normal. There was mild     concentric hypertrophy. Systolic function was vigorous. The     estimated ejection fraction was in the range of 65% to 70%. Wall     motion was normal; there were no regional wall motion     abnormalities. Features are consistent with a pseudonormal left     ventricular filling pattern, with concomitant abnormal   DUB (dysfunctional uterine bleeding) 04/04/2011   03/2011 U/S:Small fibroids, one of which demonstrates mass effect upon the endometrium and could correlate with history of dysfunctional uterine bleeding. Endometrial Biopsy 07/2012 Secretory endometrium with no hyperplasia or carcinoma.  07/2012 pap smear negative for intraepithelial neoplasia. FSH on menopausal range LH, TSH, Prolactin and Testosterone  wnl.      Encounter for allergy testing 05/13/2020   Healthcare maintenance 04/29/2021   Hemoptysis 02/2010   DUE TO ALV HGE.SABRASABRAANA NEGATIVE...DEEMED DUE TO DIASTOLIC CHF   HEMOPTYSIS UNSPECIFIED 03/28/2010   Followed in Pulmonary clinic/ [facility]/ [facility] med  regimen --med calendar 10/10/2010  3 episodes all related to hypertension with diastolic chf - last august 2012. CXR clear 11/21/10     History of anemia 11/29/2010   Last Hb 10.1 on 08/21 baseline around 9.    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   PPD positive    NEGATIVE DURING CHILDHOOD IN [employer]1ST + TEST 1993 DURING IMMIGRATION GRREN CARD CLEARENCE.SABRACXR NORMAL 1993 PER HX..s/p INR Rx AT [facility].SABRAREPEAT + 02/2010   Right knee pain 05/06/2011   Seasonal and perennial allergic rhinoconjunctivitis 11/05/2020   Upper airway cough syndrome 03/28/2020   Vitamin B12 deficiency 09/25/2016   Vitamin D  deficiency 09/24/2016   Past Surgical History:  Procedure Laterality Date   CESAREAN SECTION     TUBAL LIGATION     Patient Active Problem List   Diagnosis Date Noted   Prediabetes 05/23/2022   Muscle strain of shoulder region, left, sequela 07/03/2021   Hyperlipidemia 09/24/2016   GERD (gastroesophageal reflux disease) 10/09/2014   Hyperthyroidism 11/29/2010   Essential hypertension 03/28/2010    PCP: Jennelle Riis, MD  REFERRING PROVIDER: Teressa Rainell BROCKS, DO  REFERRING DIAG: [id number] (ICD-10-CM) - Chronic left shoulder pain   THERAPY DIAG:  Chronic left shoulder pain  Muscle weakness (generalized)  Rationale for Evaluation and  Treatment: Rehabilitation  ONSET DATE: Chronic  SUBJECTIVE:                                                                                                                                                                                      SUBJECTIVE STATEMENT: Pt presents with continued 6/10 L shoulder pain. Has been compliant with HEP.   EVAL: Pt presents to PT with reports of chronic L shoulder pain and discomfort. Denies trauma or MOI, same discomfort used to be in  the R shoulder but decreased after injection. Injection did not change any symptoms on L, has been gradually getting worse since January. No N/T or referral of pain outside of anterior L shoulder.  Hand dominance: Right  PERTINENT HISTORY: See PMH  PAIN:  Are you having pain?  Yes: NPRS scale: 6/10 Worst: 10/10 Pain location: L anterior shoulder Pain description: sharp Aggravating factors: OH reaching, lifting, styling hair Relieving factors: none  PRECAUTIONS: None  RED FLAGS: None   WEIGHT BEARING RESTRICTIONS: No  FALLS:  Has patient fallen in last 6 months? No  LIVING ENVIRONMENT: Lives with: lives with their family Lives in: House/apartment  OCCUPATION: Hair stylest   PLOF: Independent  PATIENT GOALS: decrease pain and improve functional ROM of L shoulder, be able to style hair without pain  NEXT MD VISIT: 07/28/2023  OBJECTIVE:  Note: Objective measures were completed at Evaluation unless otherwise noted.  DIAGNOSTIC FINDINGS:  See Imaging   PATIENT SURVEYS:  Quick DASH: 68% disability  COGNITION: Overall cognitive status: Within functional limits for tasks assessed     SENSATION: WFL  POSTURE: Rounded shoulders  UPPER EXTREMITY ROM:   Active ROM Right eval Left eval Left 07/28/23 Left 08/12/23  Shoulder flexion 160 83 90 125  Shoulder extension      Shoulder abduction 170 70 66   Shoulder adduction      Shoulder internal rotation T8 L5    Shoulder external rotation 70 25    Elbow flexion      Elbow extension      Wrist flexion      Wrist extension      Wrist ulnar deviation      Wrist radial deviation      Wrist pronation      Wrist supination      (Blank rows = not tested)  UPPER EXTREMITY MMT:  MMT Right eval Left eval  Shoulder flexion    Shoulder extension    Shoulder abduction    Shoulder adduction    Shoulder internal rotation 5 3+  Shoulder external rotation 5 3 with p!  Middle trapezius    Lower trapezius  Elbow  flexion    Elbow extension    Wrist flexion    Wrist extension    Wrist ulnar deviation    Wrist radial deviation    Wrist pronation    Wrist supination    Grip strength (lbs)    (Blank rows = not tested)  JOINT MOBILITY TESTING:  L GH hypomobility  PALPATION:  TTP to L infraspinatus, L biceps tendon                                                                                                                          TREATMENT: [facility] Adult PT Treatment:                                                DATE: 08/17/23 UBE x 3 min L 1.5 for functional activity tolerance UE ranger into flexion 38 2x10 L FM row 3x10 20# FM ext 2x10 20# Supine shoulder flexion 3x10 L 1# Inclined shoulder flex 2x10 L Supine D2 flexion 2x10 YTB Supine horizontal abd 2x15 green band S/L shoulder abd 2x10 1# Seated lat pulldown 2x10 25# Seated low row 2x10 25#  [facility] Adult PT Treatment:                                                DATE: 08/12/23 UBE x 3 min L 1.5 for functional activity tolerance Pulley x 2 min into flexion UE ranger into flexion 38 x 10 L FM row 3x10 17# FM ext 2x10 17# Supine shoulder flexion 3x10 L 1# Supine horizontal abd 2x15 blue band Seated shoulder flex 2x10 L - to 90 degrees  [facility] Adult PT Treatment:                                                DATE: 08/06/23 UBE x 4 min L 1.5 for functional activity tolerance Pulley x 2 min into flexion UE ranger into flexion 38 x 10 L FM row 2x10 17# FM ext 2x10 17# Supine shoulder flexion 3x10 L S/L ER 2x10 L  Supine serratus x 10 2# L  [facility] Adult PT Treatment:                                                DATE: 08/04/23 UBE x 3 min L 1.0 Pulley x 2 min into flexion FM row 2x12 13# FM ext 2x10 13# Supine shoulder flexion 2x10 2# L Supine horizontal  abd 2x15 GTB Seated bilateral ER 2x10 YTB Seated low row 2x10 25# Seated high row 2x10 25# supinated grip  [facility] Adult PT Treatment:                                                 DATE: 07/30/23  UBE x 4 min L 1.5 Pulley Seated low row 2x10 25# Seated high row 2x10 25# supinated grip Supine L shoulder flexion  1 x 10 2# L Supine short arm flexion 3# 1 x 12 L Supine protraction 3# 1 x 10 L Supine horiz abdct 10 x 2 GTB  S/L shoulder abduction 1 x 10 AROM, 1 x 10 1#  S/L shoulder ER 1# 10 x 2  [place] AROM seated x 10 L  Seated OH press AROM x 8 L  [facility] Adult PT Treatment:                                                DATE: 07/28/23  UBE lvl 1.0 x 3 min for functional activity tolerance Pulleys L shoulder flexion x 2 min Supine L shoulder flexion 1x10, 1# 1 x 10 2# L Supine horizontal abd 3x10 RTB Supine serratus punch 2x10  bilateral  S/L shoulder abduction  Seated low row 2x10 25# Seated high row 2x10 25# supinated grip L shoulder ER isometric x 10 - 5 hold FM row 2x10 13# FM ext 2x10 13#   PATIENT EDUCATION: Education details: HEP update Person educated: Patient Education method: Explanation, Demonstration, and Handouts Education comprehension: verbalized understanding and returned demonstration  HOME EXERCISE PROGRAM: Access Code: [email or web address] URL: [email or web address] Date: 07/13/2023 Prepared by: Alm Kingdom  Exercises - Standing Isometric Shoulder External Rotation with Doorway and Towel Roll  - 1 x daily - 7 x weekly - 2 sets - 10 reps - 5 sec hold - Standing Isometric Shoulder Internal Rotation with Towel Roll at Doorway  - 1 x daily - 7 x weekly - 2 sets - 10 reps - 5 sec hold - Standing Shoulder Row with Anchored Resistance  - 1 x daily - 7 x weekly - 3 sets - 10 reps - green band hold - Seated Shoulder Flexion Towel Slide at Table Top  - 1 x daily - 7 x weekly - 2 sets - 10 reps - 5 sec hold - Seated Shoulder Horizontal Abduction with Resistance  - 1 x daily - 7 x weekly - 3 sets - 10 reps - yellow band hold - Shoulder External Rotation and Scapular Retraction with Resistance  - 1 x daily - 7 x weekly - 2 sets - 10  reps - yellow band hold  ASSESSMENT:  CLINICAL IMPRESSION: Pt was able to complete all prescribed exercises with no adverse effect. Exercises today continue to progress periscapular strength and general L shoulder function. Will assess all formal goals next session and determine next steps in POC.   EVAL: Patient is a 6 y.o. F who was seen today for physical therapy evaluation and treatment for chronic L shoulder pain. Physical findings are consistent with referring provider impression as pt demonstrates decrease in L shoulder ROM and RTC strength. Quick DASH score demonstrates 68% disability in performance of home ADLs and community activities.  Pt would benefit from skilled PT services working on improving strength and ROM of L shoulder in order to improve function and decrease pain.   OBJECTIVE IMPAIRMENTS: decreased endurance, decreased mobility, decreased ROM, decreased strength, impaired UE functional use, postural dysfunction, and pain  ACTIVITY LIMITATIONS: carrying, lifting, bathing, toileting, dressing, and reach over head  PARTICIPATION LIMITATIONS: cleaning, laundry, driving, shopping, community activity, occupation, and yard work  PERSONAL FACTORS: Time since onset of injury/illness/exacerbation and 1 comorbidity: CHF are also affecting patient's functional outcome.   REHAB POTENTIAL: Good  CLINICAL DECISION MAKING: Stable/uncomplicated  EVALUATION COMPLEXITY: Low   GOALS: Goals reviewed with patient? No  SHORT TERM GOALS: Target date: 07/27/2023   Pt will be compliant and knowledgeable with initial HEP for improved comfort and carryover Baseline: initial HEP given  Goal status: MET  2.  Pt will self report left shoulder pain no greater than 7/10 for improved comfort and functional ability Baseline: 10/10 at worst Goal status: IN PROGRESS   LONG TERM GOALS: Target date: 08/31/2023   Pt will decrease Quick DASH disability score to no greater than 50% as proxy for  functional improvement with home ADLs and community activities Baseline: 68%  Goal status: INITIAL  2.  Pt will self report left shoulder pain no greater than 3/10 for improved comfort and functional ability Baseline: 10/10 at worst Goal status: IN PROGRESS   3.  Pt will improve L shoulder flexion AROM to at least 140 degrees for improved functional ability with home ADLs Baseline: 83 Goal status: IN PROGRESS  4.  Pt will improve left shoulder IR to at least T10 for improved hygiene activities and decreased pain Baseline: L5 Goal status: INITIAL  5.  Pt will improve left shoulder IR/ER MMT to no less than 4/5 for improved comfort and dynamic stability Baseline: see MMT chart Goal status: INITIAL  PLAN:  PT FREQUENCY: 2x/week  PT DURATION: 8 weeks  PLANNED INTERVENTIONS: 97164- PT Re-evaluation, 97110-Therapeutic exercises, 97530- Therapeutic activity, [id number]- Neuromuscular re-education, 97535- Self Care, [id number]- Manual therapy, [id number]- Electrical stimulation (unattended), [id number]- Electrical stimulation (manual), 97016- Vasopneumatic device, 20560 (1-2 muscles), 20561 (3+ muscles)- Dry Needling, Cryotherapy, and Moist heat  PLAN FOR NEXT SESSION: assess HEP response, periscapular and RTC strengthening, improve shoulder ROM   Alm JAYSON Kingdom PT  08/17/23 9:28 AM

## 2023-08-19 ENCOUNTER — Ambulatory Visit

## 2023-08-19 DIAGNOSIS — M25512 Pain in left shoulder: Secondary | ICD-10-CM | POA: Diagnosis not present

## 2023-08-19 DIAGNOSIS — G8929 Other chronic pain: Secondary | ICD-10-CM

## 2023-08-19 DIAGNOSIS — M6281 Muscle weakness (generalized): Secondary | ICD-10-CM

## 2023-08-19 DIAGNOSIS — R293 Abnormal posture: Secondary | ICD-10-CM

## 2023-08-19 NOTE — Therapy (Signed)
 OUTPATIENT PHYSICAL THERAPY TREATMENT   Patient Name: [name] MRN: [id number] DOB:20-Jun-1965, 58 y.o., female Today's Date: 08/19/2023  END OF SESSION:  PT End of Session - 08/19/23 0846     Visit Number 12    Number of Visits 17    Date for PT Re-Evaluation 08/31/23    Authorization Type [employer] MCD    PT Start Time 0846    PT Stop Time 0926    PT Time Calculation (min) 40 min                   Past Medical History:  Diagnosis Date   Acute bilateral low back pain with right-sided sciatica 10/09/2014   Allergic rhinitis 05/11/2017   Allergy    Back pain 06/05/2021   Bilateral leg cramps 03/28/2020   Cervical cancer screening 04/20/2020   Cough due to angiotensin-converting enzyme inhibitor 05/27/2010   Diastolic heart failure 02/2010   NEW ON ECHO 02/2010   Diastolic heart failure (HCC) 11/29/2010   ECHO performed 09/09/10 Left ventricle: The cavity size was normal. There was mild     concentric hypertrophy. Systolic function was vigorous. The     estimated ejection fraction was in the range of 65% to 70%. Wall     motion was normal; there were no regional wall motion     abnormalities. Features are consistent with a pseudonormal left     ventricular filling pattern, with concomitant abnormal   DUB (dysfunctional uterine bleeding) 04/04/2011   03/2011 U/S:Small fibroids, one of which demonstrates mass effect upon the endometrium and could correlate with history of dysfunctional uterine bleeding. Endometrial Biopsy 07/2012 Secretory endometrium with no hyperplasia or carcinoma.  07/2012 pap smear negative for intraepithelial neoplasia. FSH on menopausal range LH, TSH, Prolactin and Testosterone  wnl.      Encounter for allergy testing 05/13/2020   Healthcare maintenance 04/29/2021   Hemoptysis 02/2010   DUE TO ALV HGE.SABRASABRAANA NEGATIVE...DEEMED DUE TO DIASTOLIC CHF   HEMOPTYSIS UNSPECIFIED 03/28/2010   Followed in Pulmonary clinic/ [facility]/ [facility] med  regimen --med calendar 10/10/2010  3 episodes all related to hypertension with diastolic chf - last august 2012. CXR clear 11/21/10     History of anemia 11/29/2010   Last Hb 10.1 on 08/21 baseline around 9.    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   PPD positive    NEGATIVE DURING CHILDHOOD IN [employer]1ST + TEST 1993 DURING IMMIGRATION GRREN CARD CLEARENCE.SABRACXR NORMAL 1993 PER HX..s/p INR Rx AT [facility].SABRAREPEAT + 02/2010   Right knee pain 05/06/2011   Seasonal and perennial allergic rhinoconjunctivitis 11/05/2020   Upper airway cough syndrome 03/28/2020   Vitamin B12 deficiency 09/25/2016   Vitamin D  deficiency 09/24/2016   Past Surgical History:  Procedure Laterality Date   CESAREAN SECTION     TUBAL LIGATION     Patient Active Problem List   Diagnosis Date Noted   Prediabetes 05/23/2022   Muscle strain of shoulder region, left, sequela 07/03/2021   Hyperlipidemia 09/24/2016   GERD (gastroesophageal reflux disease) 10/09/2014   Hyperthyroidism 11/29/2010   Essential hypertension 03/28/2010    PCP: Jennelle Riis, MD  REFERRING PROVIDER: Teressa Rainell BROCKS, DO  REFERRING DIAG: [id number] (ICD-10-CM) - Chronic left shoulder pain   THERAPY DIAG:  Chronic left shoulder pain  Muscle weakness (generalized)  Abnormal posture  Rationale for Evaluation and Treatment: Rehabilitation  ONSET DATE: Chronic  SUBJECTIVE:                                                                                                                                                                                      SUBJECTIVE STATEMENT: Pt presents to PT with continued 6/10 pain in L shoulder. Pt has reviewed MRI with physician, will discuss options when she sees him in August.   EVAL: Pt presents to PT with reports of chronic  L shoulder pain and discomfort. Denies trauma or MOI, same discomfort used to be in the R shoulder but decreased after injection. Injection did not change any symptoms on L, has been gradually getting worse since January. No N/T or referral of pain outside of anterior L shoulder.  Hand dominance: Right  PERTINENT HISTORY: See PMH  PAIN:  Are you having pain?  Yes: NPRS scale: 6/10 Worst: 10/10 Pain location: L anterior shoulder Pain description: sharp Aggravating factors: OH reaching, lifting, styling hair Relieving factors: none  PRECAUTIONS: None  RED FLAGS: None   WEIGHT BEARING RESTRICTIONS: No  FALLS:  Has patient fallen in last 6 months? No  LIVING ENVIRONMENT: Lives with: lives with their family Lives in: House/apartment  OCCUPATION: Hair stylest   PLOF: Independent  PATIENT GOALS: decrease pain and improve functional ROM of L shoulder, be able to style hair without pain  NEXT MD VISIT: 07/28/2023  OBJECTIVE:  Note: Objective measures were completed at Evaluation unless otherwise noted.  DIAGNOSTIC FINDINGS:  See Imaging   PATIENT SURVEYS:  Quick DASH: 68% disability  COGNITION: Overall cognitive status: Within functional limits for tasks assessed     SENSATION: WFL  POSTURE: Rounded shoulders  UPPER EXTREMITY ROM:   Active ROM Right eval Left eval Left 07/28/23 Left 08/12/23 Left 08/19/23  Shoulder flexion 160 83 90 125 125  Shoulder extension       Shoulder abduction 170 70 66    Shoulder adduction       Shoulder internal rotation T8 L5   L2  Shoulder external rotation 70 25     Elbow flexion       Elbow extension       Wrist flexion       Wrist extension       Wrist ulnar deviation       Wrist radial deviation       Wrist pronation       Wrist supination       (Blank rows = not tested)  UPPER EXTREMITY MMT:  MMT Right eval Left eval Left 08/19/23  Shoulder flexion     Shoulder extension  Shoulder abduction     Shoulder  adduction     Shoulder internal rotation 5 3+ 4  Shoulder external rotation 5 3 with p! 4  Middle trapezius     Lower trapezius     Elbow flexion     Elbow extension     Wrist flexion     Wrist extension     Wrist ulnar deviation     Wrist radial deviation     Wrist pronation     Wrist supination     Grip strength (lbs)     (Blank rows = not tested)  JOINT MOBILITY TESTING:  L GH hypomobility  PALPATION:  TTP to L infraspinatus, L biceps tendon                                                                                                                          TREATMENT: [facility] Adult PT Treatment:                                                DATE: 08/19/23 UBE x 3 min L 1.5 for functional activity tolerance Supine shoulder flex 2x10 L 2# Supine horizontal abd 2x10 blue band Supine D2 flexion 2x10 YTB L S/L shoulder abd 2x10 2# L Standing row 2x12 blue band Shoulder ER isometric x 10 - 5 hold Wall slide L shoulder flexion x 10   [facility] Adult PT Treatment:                                                DATE: 08/17/23 UBE x 3 min L 1.5 for functional activity tolerance UE ranger into flexion 38 2x10 L FM row 3x10 20# FM ext 2x10 20# Supine shoulder flexion 3x10 L 1# Inclined shoulder flex 2x10 L Supine D2 flexion 2x10 YTB Supine horizontal abd 2x15 green band S/L shoulder abd 2x10 1# Seated lat pulldown 2x10 25# Seated low row 2x10 25#  [facility] Adult PT Treatment:                                                DATE: 08/12/23 UBE x 3 min L 1.5 for functional activity tolerance Pulley x 2 min into flexion UE ranger into flexion 38 x 10 L FM row 3x10 17# FM ext 2x10 17# Supine shoulder flexion 3x10 L 1# Supine horizontal abd 2x15 blue band Seated shoulder flex 2x10 L - to 90 degrees  PATIENT EDUCATION: Education details: HEP update Person educated: Patient Education method: Explanation, Demonstration, and Handouts Education comprehension: verbalized understanding  and returned demonstration  HOME  EXERCISE PROGRAM: Access Code: [email or web address] URL: [email or web address] Date: 08/19/2023 Prepared by: Alm Kingdom  Exercises - Supine Shoulder Flexion Extension Full Range AROM  - 1 x daily - 7 x weekly - 2 sets - 10 reps - 2lb hold - Supine Shoulder Horizontal Abduction with Resistance  - 1 x daily - 7 x weekly - 3 sets - 10 reps - blue band hold - Supine PNF D2 Flexion with Resistance  - 1 x daily - 7 x weekly - 3 sets - 10 reps - yellow band hold - Sidelying Shoulder Abduction Palm Forward  - 1 x daily - 7 x weekly - 3 sets - 10 reps - 2lb hold - Standing Shoulder Row with Anchored Resistance  - 1 x daily - 7 x weekly - 3 sets - 12 reps - blue band hold - Standing Isometric Shoulder External Rotation with Doorway and Towel Roll  - 1 x daily - 7 x weekly - 2 sets - 10 reps - 5 sec hold - Shoulder Flexion Wall Slide with Towel  - 1 x daily - 7 x weekly - 2 sets - 10 reps  ASSESSMENT:  CLINICAL IMPRESSION: Pt was able to complete all prescribed exercises with no adverse effect. Over the course of PT treatment she has been able to make progress in L shoulder function and ROM. She has met her Quick DASH score for subjective functional ability. We will wait for her to see her physician before scheduling any visits as she will discuss options about next steps for her shoulder. Pt in agreement with current plan, HEP updated.   EVAL: Patient is a 58 y.o. F who was seen today for physical therapy evaluation and treatment for chronic L shoulder pain. Physical findings are consistent with referring provider impression as pt demonstrates decrease in L shoulder ROM and RTC strength. Quick DASH score demonstrates 68% disability in performance of home ADLs and community activities. Pt would benefit from skilled PT services working on improving strength and ROM of L shoulder in order to improve function and decrease pain.   OBJECTIVE IMPAIRMENTS: decreased  endurance, decreased mobility, decreased ROM, decreased strength, impaired UE functional use, postural dysfunction, and pain  ACTIVITY LIMITATIONS: carrying, lifting, bathing, toileting, dressing, and reach over head  PARTICIPATION LIMITATIONS: cleaning, laundry, driving, shopping, community activity, occupation, and yard work  PERSONAL FACTORS: Time since onset of injury/illness/exacerbation and 1 comorbidity: CHF are also affecting patient's functional outcome.   REHAB POTENTIAL: Good  CLINICAL DECISION MAKING: Stable/uncomplicated  EVALUATION COMPLEXITY: Low   GOALS: Goals reviewed with patient? No  SHORT TERM GOALS: Target date: 07/27/2023   Pt will be compliant and knowledgeable with initial HEP for improved comfort and carryover Baseline: initial HEP given  Goal status: MET  2.  Pt will self report left shoulder pain no greater than 7/10 for improved comfort and functional ability Baseline: 10/10 at worst 08/19/2023: 6/10 Goal status: MET   LONG TERM GOALS: Target date: 08/31/2023   Pt will decrease Quick DASH disability score to no greater than 50% as proxy for functional improvement with home ADLs and community activities Baseline: 68%  08/19/2023: 46%  Goal status: MET  2.  Pt will self report left shoulder pain no greater than 3/10 for improved comfort and functional ability Baseline: 10/10 at worst 08/19/2023: 6/10 at worst Goal status: IN PROGRESS   3.  Pt will improve L shoulder flexion AROM to at least 140 degrees for improved functional ability with home ADLs Baseline: 83  08/19/2023: 125  Goal status: IN PROGRESS  4.  Pt will improve left shoulder IR to at least T10 for improved hygiene activities and decreased pain Baseline: L5 7/30/2025L L2 Goal status: IN PROGRESS  5.  Pt will improve left shoulder IR/ER MMT to no less than 4/5 for improved comfort and dynamic stability Baseline: see MMT chart Goal status: MET  PLAN:  PT FREQUENCY: 2x/week  PT  DURATION: 8 weeks  PLANNED INTERVENTIONS: 97164- PT Re-evaluation, 97110-Therapeutic exercises, 97530- Therapeutic activity, 97112- Neuromuscular re-education, 97535- Self Care, 02859- Manual therapy, G0283- Electrical stimulation (unattended), [id number]- Electrical stimulation (manual), 97016- Vasopneumatic device, 20560 (1-2 muscles), 20561 (3+ muscles)- Dry Needling, Cryotherapy, and Moist heat  PLAN FOR NEXT SESSION: assess HEP response, periscapular and RTC strengthening, improve shoulder ROM   Alm JAYSON Kingdom PT  08/19/23 9:51 AM

## 2023-08-20 ENCOUNTER — Ambulatory Visit: Admitting: Family Medicine

## 2023-08-22 ENCOUNTER — Other Ambulatory Visit: Payer: Self-pay | Admitting: Family Medicine

## 2023-08-22 DIAGNOSIS — I10 Essential (primary) hypertension: Secondary | ICD-10-CM

## 2023-08-25 ENCOUNTER — Ambulatory Visit: Admitting: Family Medicine

## 2023-08-25 ENCOUNTER — Encounter: Payer: Self-pay | Admitting: Family Medicine

## 2023-08-25 VITALS — BP 138/68 | Ht 65.0 in | Wt 242.0 lb

## 2023-08-25 DIAGNOSIS — M75102 Unspecified rotator cuff tear or rupture of left shoulder, not specified as traumatic: Secondary | ICD-10-CM | POA: Diagnosis not present

## 2023-08-25 DIAGNOSIS — M65341 Trigger finger, right ring finger: Secondary | ICD-10-CM

## 2023-08-25 DIAGNOSIS — S46812S Strain of other muscles, fascia and tendons at shoulder and upper arm level, left arm, sequela: Secondary | ICD-10-CM | POA: Diagnosis not present

## 2023-08-25 DIAGNOSIS — S46812D Strain of other muscles, fascia and tendons at shoulder and upper arm level, left arm, subsequent encounter: Secondary | ICD-10-CM | POA: Diagnosis not present

## 2023-08-25 NOTE — Patient Instructions (Signed)
 Dr. Cristy Beverley [facility] [address] [phone]

## 2023-08-25 NOTE — Progress Notes (Signed)
 DATE OF VISIT: 08/25/2023        [name] DOB: 10-09-1965 MRN: [id number]  CC:  f/u Lt shoulder & Rt 4th finger trigger finger  History of present Illness: [name] is a 58 y.o. female who presents for a follow-up visit for Lt shoulder pain with rotator cuff tear Last seen 07/13/23 - has been doing ongoing PT, but still having pain in the left shoulder No improvement with prior CSI 06/18/23 Mobic  has not been helpful Has decreased range of motion and pain with lifting and overhead activities Last PT note shows that she still has significant pain and discomfort with exercises MRI completed 08/09/2023 with findings as noted below  Trigger finger has been doing great, no recurrent issues  Medications:  Outpatient Encounter Medications as of 08/25/2023  Medication Sig   albuterol  (VENTOLIN  HFA) 108 (90 Base) MCG/ACT inhaler Inhale 2 puffs into the lungs every 6 (six) hours as needed for wheezing or shortness of breath.   amLODipine  (NORVASC ) 10 MG tablet TAKE 1 TABLET BY MOUTH AT NIGHT   atorvastatin  (LIPITOR) 20 MG tablet Take 1 tablet (20 mg total) by mouth daily.   meloxicam  (MOBIC ) 15 MG tablet TAKE 1 TABLET(15 MG) BY MOUTH DAILY   metoprolol  tartrate (LOPRESSOR ) 25 MG tablet Take 1 tablet (25 mg total) by mouth 2 (two) times daily.   montelukast  (SINGULAIR ) 10 MG tablet Take 1 tablet (10 mg total) by mouth at bedtime.   oxybutynin  (DITROPAN -XL) 5 MG 24 hr tablet Take 1 tablet (5 mg total) by mouth at bedtime.   No facility-administered encounter medications on file as of 08/25/2023.    Allergies: is allergic to crestor  [rosuvastatin ].  Physical Examination: Vitals: BP 138/68   Ht 5' 5 (1.651 m)   Wt 242 lb (109.8 kg)   LMP  (LMP Unknown) Comment: pt states 1.5 years ago   BMI 40.27 kg/m  GENERAL:  [name] is a 58 y.o. female appearing their stated age, alert and oriented x 3, in no apparent distress.  SKIN: no rashes or lesions, skin clean, dry, intact MSK: Shoulder: Left  shoulder with decreased active range of motion in all planes limited by 50% due to pain.  Near full passive range of motion with significant pain and guarding.  Tender palpation along the bicipital groove, AC joint, greater tuberosity.  Positive empty can, positive Hawkins, positive Neer, positive speeds, negative drop arm.  Rotator cuff strength 4 -/5 throughout and limited by pain. Right shoulder full range of motion without pain, weakness, instability Normal grip strength bilaterally Neurovascular intact distally  Radiology: MRI LT SHOULDER WITHOUT CONTRAST 08/09/23 showing: IMPRESSION: - Mild degenerative changes of the [facility] joint with hypertrophy. -  Severe insertional tendinosis of the supra and infraspinatus tendons with high-grade undersurface partial tears with interstitial extension. There is a near full-thickness tear of the supraspinatus tendon as above.  - Insertional tendinosis and partial tear of the subscapularis tendon.   Assessment & Plan  1. Tear of left supraspinatus tendon 2. Tear of infraspinatus tendon, left, sequela 3. Partial tear of left subscapularis tendon, subsequent encounter 4. Trigger finger, right ring finger Ongoing left shoulder pain for several months, no improvement with PT, prior cortisone injection, oral NSAIDs.  MRI showing full-thickness tear of supraspinatus, as well as partial tearing of the infraspinatus and subscapularis.  Trigger finger has resolved  Plan: - MRI findings reviewed with patient detail.  She has failed extensive conservative therapy.  Recommend surgical evaluation.  Referral to [facility] for an evaluation with Dr. Cristy for possible rotator cuff repair - She will continue her home exercises and NSAIDs as needed - She will follow-up with me on an as-needed basis, encouraged to reach out with any questions or concerns after seeing the surgeon   Patient expressed understanding & agreement with above.  Encounter  Diagnoses  Name Primary?   Tear of left supraspinatus tendon Yes   Tear of infraspinatus tendon, left, sequela    Partial tear of left subscapularis tendon, subsequent encounter    Trigger finger, right ring finger     Orders Placed This Encounter  Procedures   Ambulatory referral to Orthopedic Surgery

## 2023-09-01 ENCOUNTER — Ambulatory Visit: Admitting: Neurology

## 2023-09-01 ENCOUNTER — Encounter: Payer: Self-pay | Admitting: Neurology

## 2023-09-01 VITALS — BP 139/80 | HR 71 | Wt 246.4 lb

## 2023-09-01 DIAGNOSIS — R0681 Apnea, not elsewhere classified: Secondary | ICD-10-CM

## 2023-09-01 DIAGNOSIS — R351 Nocturia: Secondary | ICD-10-CM

## 2023-09-01 DIAGNOSIS — G4719 Other hypersomnia: Secondary | ICD-10-CM | POA: Diagnosis not present

## 2023-09-01 DIAGNOSIS — R0683 Snoring: Secondary | ICD-10-CM

## 2023-09-01 DIAGNOSIS — Z9189 Other specified personal risk factors, not elsewhere classified: Secondary | ICD-10-CM

## 2023-09-01 NOTE — Progress Notes (Signed)
 Subjective:    Patient ID: [name] is a 58 y.o. female.  HPI    True Mar, MD, PhD [facility] [address] [address] [place], [place] [address]  Dear [name],  I saw your patient, [name], upon your kind request in my sleep clinic today for initial consultation of her sleep disorder, in particular, concern for underlying obstructive sleep apnea.  The patient is unaccompanied today.  As you know, [name] is a 58 year old female with an underlying complex medical history of allergies, hypertension, hyperthyroidism, low back pain, leg cramps, diastolic congestive heart failure, prediabetes, DUB, anemia, history of hemoptysis, vitamin D  deficiency, vitamin B12 deficiency, and severe obesity with a BMI of over 40, who reports snoring and excessive daytime somnolence as well as frequent nighttime urination.  She has had occasional witnessed apneas per family report.  Her Epworth sleepiness score is 9 out of 24, fatigue severity score is 46 out of 63.  I reviewed your office note from 08/05/2023.   Of note, she was recently evaluated by [facility] pulmonology on 06/24/2023 for shortness of breath, postnasal drip and chronic cough.  I reviewed the office visit note.  A sleep study was not ordered at the time.  She had a pulmonary function test on 08/03/2023 and I reviewed the results.  She had normal lung volumes, normal diffusion capacity, and no significant response to bronchodilator.  She had a normal PFT. She goes to bed generally around 10 PM and rise time is around 6 but she does wake up around 4 or 4:30 AM for prayers.  She usually goes back to bed for an hour or so.  She has nocturia about 3-4 times per average night.  She is on Ditropan  but does not believe it has helped much.  She denies any recurrent nocturnal or morning headaches.  She is not aware of any family history of sleep apnea.  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  She lives with  her husband and 2 of her children, 3 live on their own.  She has 1 dog in the household, the dog does not sleep in the bedroom with her.  She does have a TV in the bedroom but does not turn it on at night.  She does not wake up fully rested.  She drinks caffeine in the form of coffee, 1 cup in the morning, no alcohol, she is a non-smoker.  Her Past Medical History Is Significant For: Past Medical History:  Diagnosis Date   Acute bilateral low back pain with right-sided sciatica 10/09/2014   Allergic rhinitis 05/11/2017   Allergy    Back pain 06/05/2021   Bilateral leg cramps 03/28/2020   Cervical cancer screening 04/20/2020   Cough due to angiotensin-converting enzyme inhibitor 05/27/2010   Diastolic heart failure 02/2010   NEW ON ECHO 02/2010   Diastolic heart failure (HCC) 11/29/2010   ECHO performed 09/09/10 Left ventricle: The cavity size was normal. There was mild     concentric hypertrophy. Systolic function was vigorous. The     estimated ejection fraction was in the range of 65% to 70%. Wall     motion was normal; there were no regional wall motion     abnormalities. Features are consistent with a pseudonormal left     ventricular filling pattern, with concomitant abnormal   DUB (dysfunctional uterine bleeding) 04/04/2011   03/2011 U/S:Small fibroids, one of which demonstrates mass effect upon the endometrium and  could correlate with history of dysfunctional uterine bleeding. Endometrial Biopsy 07/2012 Secretory endometrium with no hyperplasia or carcinoma.  07/2012 pap smear negative for intraepithelial neoplasia. FSH on menopausal range LH, TSH, Prolactin and Testosterone  wnl.      Encounter for allergy testing 05/13/2020   Healthcare maintenance 04/29/2021   Hemoptysis 02/2010   DUE TO ALV HGE.SABRASABRAANA NEGATIVE...DEEMED DUE TO DIASTOLIC CHF   HEMOPTYSIS UNSPECIFIED 03/28/2010   Followed in Pulmonary clinic/ [facility]/ [facility] Complex med regimen --med calendar 10/10/2010  3  episodes all related to hypertension with diastolic chf - last august 2012. CXR clear 11/21/10     History of anemia 11/29/2010   Last Hb 10.1 on 08/21 baseline around 9.    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   PPD positive    NEGATIVE DURING CHILDHOOD IN [employer]1ST + TEST 1993 DURING IMMIGRATION GRREN CARD CLEARENCE.SABRACXR NORMAL 1993 PER HX..s/p INR Rx AT [facility].SABRAREPEAT + 02/2010   Right knee pain 05/06/2011   Seasonal and perennial allergic rhinoconjunctivitis 11/05/2020   Upper airway cough syndrome 03/28/2020   Vitamin B12 deficiency 09/25/2016   Vitamin D  deficiency 09/24/2016    Her Past Surgical History Is Significant For: Past Surgical History:  Procedure Laterality Date   CESAREAN SECTION     TUBAL LIGATION      Her Family History Is Significant For: Family History  Problem Relation Age of Onset   Hypertension Father    Sudden death Father    Hyperlipidemia Neg Hx    Heart attack Neg Hx    Diabetes Neg Hx    Colon cancer Neg Hx    Colon polyps Neg Hx    Esophageal cancer Neg Hx    Stomach cancer Neg Hx    Rectal cancer Neg Hx    Asthma Neg Hx     Her Social History Is Significant For: Social History   Socioeconomic History   Marital status: Divorced    Spouse name: Not on file   Number of children: 5   Years of education: Not on file   Highest education level: Not on file  Occupational History   Occupation: Hair Stylist    Comment: works 4 days per week  Tobacco Use   Smoking status: Never    Passive exposure: Never   Smokeless tobacco: Never  Vaping Use   Vaping status: Never Used  Substance and Sexual Activity   Alcohol use: No   Drug use: No   Sexual activity: Yes    Birth control/protection: Surgical, Post-menopausal    Comment: BTL 58 years old  Other Topics Concern    Not on file  Social History Narrative   12/10/20- Has 5 children, her twin 71 year old boys live with her, patient is Muslim   Social Drivers of Corporate investment banker Strain: Low Risk  (12/10/2020)   Overall Financial Resource Strain (CARDIA)    Difficulty of Paying Living Expenses: Not hard at all  Food Insecurity: No Food Insecurity (12/10/2020)   Hunger Vital Sign    Worried About Running Out of Food in the Last Year: Never true    Ran Out of Food in the Last Year: Never true  Transportation Needs: No Transportation Needs (12/10/2020)   PRAPARE - Transportation    Lack of Transportation (Medical): No    Lack of  Transportation (Non-Medical): No  Physical Activity: Sufficiently Active (12/10/2020)   Exercise Vital Sign    Days of Exercise per Week: 7 days    Minutes of Exercise per Session: 50 min  Stress: Not on file  Social Connections: Moderately Integrated (12/10/2020)   Social Connection and Isolation Panel    Frequency of Communication with Friends and Family: More than three times a week    Frequency of Social Gatherings with Friends and Family: More than three times a week    Attends Religious Services: More than 4 times per year    Active Member of [employer] or Organizations: Yes    Attends Engineer, structural: More than 4 times per year    Marital Status: Divorced    Her Allergies Are:  Allergies  Allergen Reactions   Crestor  [Rosuvastatin ] Other (See Comments)    Headache   :   Her Current Medications Are:  Outpatient Encounter Medications as of 09/01/2023  Medication Sig   amLODipine  (NORVASC ) 10 MG tablet TAKE 1 TABLET BY MOUTH AT NIGHT   atorvastatin  (LIPITOR) 20 MG tablet Take 1 tablet (20 mg total) by mouth daily.   meloxicam  (MOBIC ) 15 MG tablet TAKE 1 TABLET(15 MG) BY MOUTH DAILY   metoprolol  tartrate (LOPRESSOR ) 25 MG tablet Take 1 tablet (25 mg total) by mouth 2 (two) times daily.   oxybutynin  (DITROPAN -XL) 5 MG 24 hr tablet Take 1  tablet (5 mg total) by mouth at bedtime.   albuterol  (VENTOLIN  HFA) 108 (90 Base) MCG/ACT inhaler Inhale 2 puffs into the lungs every 6 (six) hours as needed for wheezing or shortness of breath. (Patient not taking: Reported on 09/01/2023)   montelukast  (SINGULAIR ) 10 MG tablet Take 1 tablet (10 mg total) by mouth at bedtime. (Patient not taking: Reported on 09/01/2023)   No facility-administered encounter medications on file as of 09/01/2023.  :   Review of Systems:  Out of a complete 14 point review of systems, all are reviewed and negative with the exception of these symptoms as listed below:  Review of Systems  Neurological:        Pt here for sleep consult  Pt snores,fatigue,hypertension Pt denies headaches,sleep study,cpap machine    ESS:11 FSS:46               Objective:  Neurological Exam  Physical Exam Physical Examination:   Vitals:   09/01/23 1015  BP: 139/80  Pulse: 71    General Examination: The patient is a very pleasant 58 y.o. female in no acute distress. She appears well-developed and well-nourished and well groomed.   HEENT: Normocephalic, atraumatic, pupils are equal, round and reactive to light, extraocular tracking is good without limitation to gaze excursion or nystagmus noted. Hearing is grossly intact. Face is symmetric with normal facial animation. Speech is clear with no dysarthria noted. There is no hypophonia. There is no lip, neck/head, jaw or voice tremor. Neck is supple with full range of passive and active motion. There are no carotid bruits on auscultation. Oropharynx exam reveals: mild mouth dryness, adequate dental hygiene and mild airway crowding, due to small airway entry, tonsils on the smaller side, Mallampati class III, slightly wider tongue.  Neck circumference 16 1 eighths inches, slight overbite.  Tongue protrudes centrally and palate elevates symmetrically.    Chest: Clear to auscultation without wheezing, rhonchi or crackles  noted.  Heart: S1+S2+0, regular with a 3/6 systolic heart murmur noted, not new per patient.    Abdomen: Soft, non-tender and  non-distended.  Extremities: There is trace pitting edema in the distal lower extremities bilaterally.   Skin: Warm and dry without trophic changes noted.   Musculoskeletal: exam reveals no obvious joint deformities.   Neurologically:  Mental status: The patient is awake, alert and oriented in all 4 spheres. Her immediate and remote memory, attention, language skills and fund of knowledge are appropriate. There is no evidence of aphasia, agnosia, apraxia or anomia. Speech is clear with normal prosody and enunciation. Thought process is linear. Mood is normal and affect is normal.  Cranial nerves II - XII are as described above under HEENT exam.  Motor exam: Normal bulk, strength and tone is noted. There is no obvious action or resting tremor.  Fine motor skills and coordination: grossly intact.  Cerebellar testing: No dysmetria or intention tremor. There is no truncal or gait ataxia.  Sensory exam: intact to light touch in the upper and lower extremities.  Gait, station and balance: She stands easily. No veering to one side is noted. No leaning to one side is noted. Posture is age-appropriate and stance is narrow based. Gait shows normal stride length and normal pace. No problems turning are noted.   Assessment and plan:  In summary, Tamana Hatfield is a very pleasant 58 y.o.-year old female with an underlying complex medical history of allergies, hypertension, hyperthyroidism, low back pain, leg cramps, diastolic congestive heart failure, prediabetes, DUB, anemia, history of hemoptysis, vitamin D  deficiency, vitamin B12 deficiency, and severe obesity with a BMI of over 40, whose history and physical exam are concerning for sleep disordered breathing, particularly obstructive sleep apnea (OSA). A laboratory attended sleep study is typically considered gold standard for  evaluation of sleep disordered breathing.   I had a long chat with the patient about my findings and the diagnosis of sleep apnea, particularly OSA, its prognosis and treatment options. We talked about medical/conservative treatments, surgical interventions and non-pharmacological approaches for symptom control. I explained, in particular, the risks and ramifications of untreated moderate to severe OSA, especially with respect to developing cardiovascular disease down the road, including congestive heart failure (CHF), difficult to treat hypertension, cardiac arrhythmias (particularly A-fib), neurovascular complications including TIA, stroke and dementia. Even type 2 diabetes has, in part, been linked to untreated OSA. Symptoms of untreated OSA may include (but may not be limited to) daytime sleepiness, nocturia (i.e. frequent nighttime urination), memory problems, mood irritability and suboptimally controlled or worsening mood disorder such as depression and/or anxiety, lack of energy, lack of motivation, physical discomfort, as well as recurrent headaches, especially morning or nocturnal headaches. We talked about the importance of maintaining a healthy lifestyle and striving for healthy weight.   I recommended a sleep study at this time. I outlined the differences between a laboratory attended sleep study which is considered more comprehensive and accurate over the option of a home sleep test (HST); the latter may lead to underestimation of sleep disordered breathing in some instances and does not help with diagnosing upper airway resistance syndrome and is not accurate enough to diagnose primary central sleep apnea typically. I outlined possible surgical and non-surgical treatment options of OSA, including the use of a positive airway pressure (PAP) device (i.e. CPAP, AutoPAP/APAP or BiPAP in certain circumstances), a custom-made dental device (aka oral appliance, which would require a referral to a  specialist dentist or orthodontist typically, and is generally speaking not considered for patients with full dentures or edentulous state), upper airway surgical options, such as traditional UPPP (which is  not considered a first-line treatment) or the Inspire device (hypoglossal nerve stimulator, which would involve a referral for consultation with an ENT surgeon, after careful selection, following inclusion criteria - also not first-line treatment). I explained the PAP treatment option to the patient in detail, as this is generally considered first-line treatment.  The patient indicated that she would be willing to try PAP therapy, if the need arises. I explained the importance of being compliant with PAP treatment, not only for insurance purposes but primarily to improve patient's symptoms symptoms, and for the patient's long term health benefit, including to reduce Her cardiovascular risks longer-term.    We will pick up our discussion about the next steps and treatment options after testing.  We will keep her posted as to the test results by phone call and/or MyChart messaging where possible.  We will plan to follow-up in sleep clinic accordingly as well.  I answered all her questions today and the patient was in agreement.   I encouraged her to call with any interim questions, concerns, problems or updates or email us  through MyChart.  Generally speaking, sleep test authorizations may take up to 2 weeks, sometimes less, sometimes longer, the patient is encouraged to get in touch with us  if they do not hear back from the sleep lab staff directly within the next 2 weeks.  Thank you very much for allowing me to participate in the care of this nice patient. If I can be of any further assistance to you please do not hesitate to call me at [phone].  Sincerely,   True Mar, MD, PhD

## 2023-09-01 NOTE — Patient Instructions (Signed)

## 2023-09-04 ENCOUNTER — Telehealth: Payer: Self-pay | Admitting: Neurology

## 2023-09-04 NOTE — Telephone Encounter (Signed)
 NPSG [facility] pending.

## 2023-09-07 ENCOUNTER — Encounter: Payer: Self-pay | Admitting: Internal Medicine

## 2023-09-07 ENCOUNTER — Ambulatory Visit (INDEPENDENT_AMBULATORY_CARE_PROVIDER_SITE_OTHER): Admitting: Internal Medicine

## 2023-09-07 ENCOUNTER — Ambulatory Visit: Admitting: Family Medicine

## 2023-09-07 VITALS — BP 140/82 | HR 71 | Temp 98.7°F | Ht 65.0 in | Wt 242.0 lb

## 2023-09-07 DIAGNOSIS — R0602 Shortness of breath: Secondary | ICD-10-CM

## 2023-09-07 DIAGNOSIS — I5189 Other ill-defined heart diseases: Secondary | ICD-10-CM | POA: Diagnosis not present

## 2023-09-07 DIAGNOSIS — G4733 Obstructive sleep apnea (adult) (pediatric): Secondary | ICD-10-CM

## 2023-09-07 NOTE — Progress Notes (Signed)
 [name]    [id number]    13-Aug-1965  Primary Care Physician:Caudle, Thersia Bitters, FNP Date of Appointment: 09/07/2023 Established Patient Visit  Chief complaint:   Chief Complaint  Patient presents with   Shortness of Breath    Sob on exertion, not improved.      HPI: [name] is a 58 y.o. woman with shortness of breath on exertion and chronic cough  Interval Updates: Here for follow up after PFTs which show normal pulmonary function. She has since seen sleep medicine and a sleep study has been ordered. She does have snoring and excessive daytime sleepiness.   No improvement previously with albuterol . Occasional wheeze with forced expiration. No chest tightness, recurrent bronchitis. No palpitations or chest pain.   Lower extremity edema x 1 year.   BP a little high today but she says it runs in the systolic 130s at home.   I have reviewed the patient's family social and past medical history and updated as appropriate.   Past Medical History:  Diagnosis Date   Acute bilateral low back pain with right-sided sciatica 10/09/2014   Allergic rhinitis 05/11/2017   Allergy    Back pain 06/05/2021   Bilateral leg cramps 03/28/2020   Cervical cancer screening 04/20/2020   Cough due to angiotensin-converting enzyme inhibitor 05/27/2010   Diastolic heart failure 02/2010   NEW ON ECHO 02/2010   Diastolic heart failure (HCC) 11/29/2010   ECHO performed 09/09/10 Left ventricle: The cavity size was normal. There was mild     concentric hypertrophy. Systolic function was vigorous. The     estimated ejection fraction was in the range of 65% to 70%. Wall     motion was normal; there were no regional wall motion     abnormalities. Features are consistent with a pseudonormal left     ventricular filling pattern, with concomitant abnormal   DUB (dysfunctional uterine bleeding) 04/04/2011   03/2011 U/S:Small fibroids, one of which demonstrates mass effect upon the endometrium and  could correlate with history of dysfunctional uterine bleeding. Endometrial Biopsy 07/2012 Secretory endometrium with no hyperplasia or carcinoma.  07/2012 pap smear negative for intraepithelial neoplasia. FSH on menopausal range LH, TSH, Prolactin and Testosterone  wnl.      Encounter for allergy testing 05/13/2020   Healthcare maintenance 04/29/2021   Hemoptysis 02/2010   DUE TO ALV HGE.SABRASABRAANA NEGATIVE...DEEMED DUE TO DIASTOLIC CHF   HEMOPTYSIS UNSPECIFIED 03/28/2010   Followed in Pulmonary clinic/ [facility]/ [facility] Complex med regimen --med calendar 10/10/2010  3 episodes all related to hypertension with diastolic chf - last august 2012. CXR clear 11/21/10     History of anemia 11/29/2010   Last Hb 10.1 on 08/21 baseline around 9.    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   PPD positive    NEGATIVE DURING CHILDHOOD IN [employer]1ST + TEST 1993 DURING IMMIGRATION GRREN CARD CLEARENCE.SABRACXR NORMAL 1993 PER HX..s/p INR Rx AT [facility].SABRAREPEAT + 02/2010   Right knee pain 05/06/2011   Seasonal and perennial allergic rhinoconjunctivitis 11/05/2020   Upper airway cough syndrome 03/28/2020   Vitamin B12 deficiency 09/25/2016   Vitamin D  deficiency 09/24/2016    Past Surgical History:  Procedure Laterality Date   CESAREAN SECTION     TUBAL LIGATION      Family History  Problem  Relation Age of Onset   Hypertension Father    Sudden death Father    Hyperlipidemia Neg Hx    Heart attack Neg Hx    Diabetes Neg Hx    Colon cancer Neg Hx    Colon polyps Neg Hx    Esophageal cancer Neg Hx    Stomach cancer Neg Hx    Rectal cancer Neg Hx    Asthma Neg Hx     Social History   Occupational History   Occupation: Social worker    Comment: works 4 days per week  Tobacco Use   Smoking status: Never    Passive exposure:  Never   Smokeless tobacco: Never  Vaping Use   Vaping status: Never Used  Substance and Sexual Activity   Alcohol use: No   Drug use: No   Sexual activity: Yes    Birth control/protection: Surgical, Post-menopausal    Comment: BTL 58 years old     Physical Exam: Blood pressure (!) 140/82, pulse 71, temperature 98.7 F (37.1 C), temperature source Oral, height 5' 5 (1.651 m), weight 242 lb (109.8 kg), SpO2 96%.  Gen:      No acute distress ENT:  no nasal polyps, mucus membranes moist Lungs:    No increased respiratory effort, symmetric chest wall excursion, clear to auscultation bilaterally, no wheezes or crackles CV:         Regular rate, systolic murmur, trace pedal edema   Data Reviewed: Imaging: I have personally reviewed the chest ray May 2024 - no acute process  PFTs:     Latest Ref Rng & Units 08/03/2023    4:02 PM  PFT Results  FVC-Pre L 2.57   FVC-Predicted Pre % 74   FVC-Post L 2.54   FVC-Predicted Post % 73   Pre FEV1/FVC % % 87   Post FEV1/FCV % % 87   FEV1-Pre L 2.25   FEV1-Predicted Pre % 83   FEV1-Post L 2.20   DLCO uncorrected ml/min/mmHg 18.27   DLCO UNC% % 87   DLCO corrected ml/min/mmHg 18.27   DLCO COR %Predicted % 87   DLVA Predicted % 113   TLC L 4.56   TLC % Predicted % 87   RV % Predicted % 92    I have personally reviewed the patient's PFTs and normal pulmonary function  Labs:  Immunization status: Immunization History  Administered Date(s) Administered   Influenza Split 11/21/2010   Influenza Whole 03/21/2010   Influenza, Seasonal, Injecte, Preservative Fre 10/30/2022   Meningococcal Mcv4o 04/17/2021   PFIZER Comirnaty(Gray Top)Covid-19 Tri-Sucrose Vaccine 06/26/2019, 07/19/2019, 04/20/2020   PNEUMOCOCCAL CONJUGATE-20 10/30/2022   Pfizer Covid-19 Vaccine Bivalent Booster 5yrs & up 04/23/2021   Pfizer(Comirnaty)Fall Seasonal Vaccine 12 years and older 10/30/2022   Tdap 01/02/2011, 06/02/2022    External Records Personally  Reviewed: primary care  Assessment:  Shortness of breath Diastolic Dysfunction Possible OSA  Plan/Recommendations:  Your pulmonary function testing is normal.  I think it is most likely that your shortness of breath is related to your diastolic dysfunction.  This means you have a stiff heart.  This is a form of heart failure that you most likely have because of high blood pressure and possible sleep apnea. Referral to cardiology\.  I recommend following up with your primary care doctor about swelling in your legs.  It could be a side effect of the amlodipine .  See if you can switch to a different blood pressure medication.  Keep up the great work with  [employer].  Exercise is very important in improving your heart function, controlling your blood pressure, and improving shortness of breath.  If you can try to walk 20 minutes every day.   Return to Care: Return if symptoms worsen or fail to improve.   Verdon Gore, MD Pulmonary and Critical Care Medicine [facility] Office:[phone]

## 2023-09-07 NOTE — Patient Instructions (Addendum)
 It was a pleasure to see you today!  Please schedule follow up as needed. Please call [phone] if issues or concerns arise. You can also send us  a message through MyChart, but but aware that this is not to be used for urgent issues and it may take up to 5-7 days to receive a reply. Please be aware that you will likely be able to view your results before I have a chance to respond to them. Please give us  5 business days to respond to any non-urgent results.   Your pulmonary function testing is normal.  I think it is most likely that your shortness of breath is related to your diastolic dysfunction.  This means you have a stiff heart.  This is a form of heart failure that you most likely have because of high blood pressure and possible sleep apnea.  I recommend following up with your primary care doctor about swelling in your legs.  It could be a side effect of the amlodipine .  See if you can switch to a different blood pressure medication.  Keep up the great work with [employer] [facility].  Exercise is very important in improving your heart function, controlling your blood pressure, and improving shortness of breath.  If you can try to walk 20 minutes every day.

## 2023-09-09 NOTE — Telephone Encounter (Signed)
 NPSG MCD [facility] shara: [id number] (exp. 09/04/23 to 12/03/23)

## 2023-09-16 NOTE — Telephone Encounter (Signed)
 I spoke with the patient.  NPSG M[facility] shara: [id number] (exp. 09/04/23 to 12/03/23)   She is scheduled at [facility] for 10/06/23 at 9 pm.  Mailed packet and sent mychart.

## 2023-09-23 ENCOUNTER — Encounter: Payer: Self-pay | Admitting: Nurse Practitioner

## 2023-09-23 ENCOUNTER — Ambulatory Visit: Attending: Cardiovascular Disease | Admitting: Nurse Practitioner

## 2023-09-23 VITALS — BP 126/74 | HR 68 | Ht 65.0 in | Wt 245.2 lb

## 2023-09-23 DIAGNOSIS — I10 Essential (primary) hypertension: Secondary | ICD-10-CM | POA: Insufficient documentation

## 2023-09-23 DIAGNOSIS — R0609 Other forms of dyspnea: Secondary | ICD-10-CM | POA: Diagnosis not present

## 2023-09-23 DIAGNOSIS — I5189 Other ill-defined heart diseases: Secondary | ICD-10-CM | POA: Diagnosis not present

## 2023-09-23 DIAGNOSIS — E782 Mixed hyperlipidemia: Secondary | ICD-10-CM | POA: Insufficient documentation

## 2023-09-23 DIAGNOSIS — R600 Localized edema: Secondary | ICD-10-CM | POA: Insufficient documentation

## 2023-09-23 MED ORDER — METOPROLOL TARTRATE 100 MG PO TABS: ORAL_TABLET | ORAL | 0 refills | Status: AC

## 2023-09-23 NOTE — Patient Instructions (Signed)
 Medication Instructions:  Metoprolol  Tartrate 100 mg take 1 tablet 2 hours prior to cardiac CT. Continue your current medications as directed  *If you need a refill on your cardiac medications before your next appointment, please call your pharmacy*  Lab Work: Fasting Lipid panel & CMET 1 week prior to cardiac CT  Testing/Procedures:   Your cardiac CT will be scheduled at one of the below locations:   [facility] [address] [place], [place] [address] [phone]  [facility] [address]  [place], [place] [address] [phone]  If scheduled at the Heart and V[facility] Tower at [facility], please enter the parking lot using the [address] entrance and use the FREE valet service at the patient drop-off area. Enter the building and check-in with registration on the main floor.  If scheduled at [facility], please arrive to the Heart and Vascular Center 15 mins early for check-in and test prep.  Please follow these instructions carefully (unless otherwise directed):   On the Night Before the Test: Be sure to Drink plenty of water. Do not consume any caffeinated/decaffeinated beverages or chocolate 12 hours prior to your test. Do not take any antihistamines 12 hours prior to your test.  On the Day of the Test: Drink plenty of water until 1 hour prior to the test. Do not eat any food 1 hour prior to test. You may take your regular medications prior to the test.  Take metoprolol  (Lopressor ) two hours prior to test. If you take Furosemide /Hydrochlorothiazide/Spironolactone/Chlorthalidone, please HOLD on the morning of the test. Patients who wear a continuous glucose monitor MUST remove the device prior to scanning. FEMALES- please wear underwire-free bra if available, avoid dresses & tight clothing  After the Test: Drink plenty of water. After receiving IV contrast, you may experience a mild  flushed feeling. This is normal. On occasion, you may experience a mild rash up to 24 hours after the test. This is not dangerous. If this occurs, you can take Benadryl 25 mg, Zyrtec, Claritin, or Allegra and increase your fluid intake. (Patients taking Tikosyn should avoid Benadryl, and may take Zyrtec, Claritin, or Allegra) If you experience trouble breathing, this can be serious. If it is severe call 911 IMMEDIATELY. If it is mild, please call our office.  We will call to schedule your test 2-4 weeks out understanding that some insurance companies will need an authorization prior to the service being performed.   For more information and frequently asked questions, please visit our website : [email or web address]  For non-scheduling related questions, please contact the cardiac imaging nurse navigator should you have any questions/concerns: Cardiac Imaging Nurse Navigators Direct Office Dial: [phone]   For scheduling needs, including cancellations and rescheduling, please call [place], [phone].   Follow-Up: At [facility], you and your health needs are our priority.  As part of our continuing mission to provide you with exceptional heart care, our providers are all part of one team.  This team includes your primary Cardiologist (physician) and Advanced Practice Providers or APPs (Physician Assistants and Nurse Practitioners) who all work together to provide you with the care you need, when you need it.  Your next appointment:   6-8 week(s)  Provider:   Dr. Sheena or Damien Braver NP  We recommend signing up for the patient portal called MyChart.  Sign up information is provided on this After Visit Summary.  MyChart is used to connect with patients for  Virtual Visits (Telemedicine).  Patients are able to view lab/test results, encounter notes, upcoming appointments, etc.  Non-urgent messages can be sent to your provider as well.   To learn more about what you  can do with MyChart, go to [email or web address].

## 2023-09-23 NOTE — Progress Notes (Signed)
 Office Visit    Patient Name: [name] Date of Encounter: 09/23/2023  Primary Care Provider:  Knute Thersia Bitters, FNP Primary Cardiologist:  Kardie Tobb, DO  Chief Complaint    58 year old female with a history of diastolic dysfunction, dyspnea on exertion, bilateral lower extremity edema, hypertension, hyperlipidemia, hyperthyroidism s/p thyroidectomy, and chronic cough who presents for heart first clinic new patient visit.  Past Medical History    Past Medical History:  Diagnosis Date   Acute bilateral low back pain with right-sided sciatica 10/09/2014   Allergic rhinitis 05/11/2017   Allergy    Back pain 06/05/2021   Bilateral leg cramps 03/28/2020   Cervical cancer screening 04/20/2020   Cough due to angiotensin-converting enzyme inhibitor 05/27/2010   Diastolic heart failure 02/2010   NEW ON ECHO 02/2010   Diastolic heart failure (HCC) 11/29/2010   ECHO performed 09/09/10 Left ventricle: The cavity size was normal. There was mild     concentric hypertrophy. Systolic function was vigorous. The     estimated ejection fraction was in the range of 65% to 70%. Wall     motion was normal; there were no regional wall motion     abnormalities. Features are consistent with a pseudonormal left     ventricular filling pattern, with concomitant abnormal   DUB (dysfunctional uterine bleeding) 04/04/2011   03/2011 U/S:Small fibroids, one of which demonstrates mass effect upon the endometrium and could correlate with history of dysfunctional uterine bleeding. Endometrial Biopsy 07/2012 Secretory endometrium with no hyperplasia or carcinoma.  07/2012 pap smear negative for intraepithelial neoplasia. FSH on menopausal range LH, TSH, Prolactin and Testosterone  wnl.      Encounter for allergy testing 05/13/2020   Healthcare maintenance 04/29/2021   Hemoptysis 02/2010   DUE TO ALV HGE.SABRASABRAANA NEGATIVE...DEEMED DUE TO DIASTOLIC CHF   HEMOPTYSIS UNSPECIFIED 03/28/2010   Followed in Pulmonary clinic/  [facility]/ [facility] med regimen --med calendar 10/10/2010  3 episodes all related to hypertension with diastolic chf - last august 2012. CXR clear 11/21/10     History of anemia 11/29/2010   Last Hb 10.1 on 08/21 baseline around 9.    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   PPD positive    NEGATIVE DURING CHILDHOOD IN [facility]1ST + TEST 1993 DURING IMMIGRATION GRREN CARD CLEARENCE.SABRACXR NORMAL 1993 PER HX..s/p INR Rx AT [facility].SABRAREPEAT + 02/2010   Right knee pain 05/06/2011   Seasonal and perennial allergic rhinoconjunctivitis 11/05/2020   Upper airway cough syndrome 03/28/2020   Vitamin B12 deficiency 09/25/2016   Vitamin D  deficiency 09/24/2016   Past Surgical History:  Procedure Laterality Date   CESAREAN SECTION     TUBAL LIGATION      Allergies  Allergies  Allergen Reactions   Crestor  [Rosuvastatin ] Other (See Comments)    Headache      Labs/Other Studies Reviewed    The following studies were reviewed today:  Cardiac Studies & Procedures   ______________________________________________________________________________________________     ECHOCARDIOGRAM  ECHOCARDIOGRAM COMPLETE 11/21/2022  Narrative ECHOCARDIOGRAM REPORT    Patient Name:   [name]  Date of Exam: 11/21/2022 Medical Rec #:  [id number]  Height:       65.0 in Accession #:    [id number] Weight:       242.6 lb Date of Birth:  November 16, 1965  BSA:  2.147 m Patient Age:    57 years   BP:           139/68 mmHg Patient Gender: F          HR:           78 bpm. Exam Location:  Outpatient  Procedure: 2D Echo, Color Doppler and Cardiac Doppler  Indications:    R06.00 Dyspnea  History:        Patient has prior history of Echocardiogram examinations. Risk Factors:Hypertension.  Sonographer:    L.  Thornton-Maynard Referring Phys: [id number] BRITTANY J MCINTYRE  IMPRESSIONS   1. Left ventricular ejection fraction, by estimation, is 60 to 65%. The left ventricle has normal function. The left ventricle has no regional wall motion abnormalities. There is mild concentric left ventricular hypertrophy. Left ventricular diastolic parameters are consistent with Grade I diastolic dysfunction (impaired relaxation). 2. Right ventricular systolic function is normal. The right ventricular size is normal. There is normal pulmonary artery systolic pressure. 3. Left atrial size was mildly dilated. 4. The mitral valve is normal in structure. Trivial mitral valve regurgitation. No evidence of mitral stenosis. 5. The aortic valve is tricuspid. There is mild calcification of the aortic valve. There is mild thickening of the aortic valve. Aortic valve regurgitation is not visualized. Aortic valve sclerosis/calcification is present, without any evidence of aortic stenosis. 6. The inferior vena cava is normal in size with greater than 50% respiratory variability, suggesting right atrial pressure of 3 mmHg.  FINDINGS Left Ventricle: Left ventricular ejection fraction, by estimation, is 60 to 65%. The left ventricle has normal function. The left ventricle has no regional wall motion abnormalities. The left ventricular internal cavity size was normal in size. There is mild concentric left ventricular hypertrophy. Left ventricular diastolic parameters are consistent with Grade I diastolic dysfunction (impaired relaxation). Indeterminate filling pressures.  Right Ventricle: The right ventricular size is normal. No increase in right ventricular wall thickness. Right ventricular systolic function is normal. There is normal pulmonary artery systolic pressure. The tricuspid regurgitant velocity is 2.06 m/s, and with an assumed right atrial pressure of 3 mmHg, the estimated right ventricular systolic pressure is 20.0  mmHg.  Left Atrium: Left atrial size was mildly dilated.  Right Atrium: Right atrial size was normal in size.  Pericardium: There is no evidence of pericardial effusion.  Mitral Valve: The mitral valve is normal in structure. Trivial mitral valve regurgitation. No evidence of mitral valve stenosis.  Tricuspid Valve: The tricuspid valve is normal in structure. Tricuspid valve regurgitation is not demonstrated. No evidence of tricuspid stenosis.  Aortic Valve: The aortic valve is tricuspid. There is mild calcification of the aortic valve. There is mild thickening of the aortic valve. Aortic valve regurgitation is not visualized. Aortic valve sclerosis/calcification is present, without any evidence of aortic stenosis. Aortic valve mean gradient measures 10.0 mmHg. Aortic valve peak gradient measures 17.0 mmHg. Aortic valve area, by VTI measures 2.49 cm.  Pulmonic Valve: The pulmonic valve was normal in structure. Pulmonic valve regurgitation is not visualized. No evidence of pulmonic stenosis.  Aorta: The aortic root and ascending aorta are structurally normal, with no evidence of dilitation.  Venous: The inferior vena cava is normal in size with greater than 50% respiratory variability, suggesting right atrial pressure of 3 mmHg.  IAS/Shunts: No atrial level shunt detected by color flow Doppler.   LEFT VENTRICLE PLAX 2D LVIDd:         5.00 cm     Diastology LVIDs:  3.30 cm     LV e' medial:    7.07 cm/s LV PW:         1.20 cm     LV E/e' medial:  11.0 LV IVS:        1.30 cm     LV e' lateral:   6.20 cm/s LVOT diam:     2.20 cm     LV E/e' lateral: 12.5 LV SV:         103 LV SV Index:   48 LVOT Area:     3.80 cm  LV Volumes (MOD) LV vol d, MOD A2C: 71.0 ml LV vol d, MOD A4C: 75.9 ml LV vol s, MOD A2C: 15.8 ml LV vol s, MOD A4C: 29.4 ml LV SV MOD A2C:     55.2 ml LV SV MOD A4C:     75.9 ml LV SV MOD BP:      53.9 ml  RIGHT VENTRICLE             IVC RV S prime:      17.30 cm/s  IVC diam: 1.90 cm TAPSE (M-mode): 3.1 cm  LEFT ATRIUM             Index        RIGHT ATRIUM           Index LA diam:        3.90 cm 1.82 cm/m   RA Area:     14.10 cm LA Vol (A2C):   72.0 ml 33.53 ml/m  RA Volume:   33.60 ml  15.65 ml/m LA Vol (A4C):   46.4 ml 21.61 ml/m LA Biplane Vol: 57.9 ml 26.96 ml/m AORTIC VALVE                     PULMONIC VALVE AV Area (Vmax):    2.29 cm      PV Vmax:          1.29 m/s AV Area (Vmean):   2.20 cm      PV Peak grad:     6.7 mmHg AV Area (VTI):     2.49 cm      PR End Diast Vel: 4.16 msec AV Vmax:           206.00 cm/s AV Vmean:          146.500 cm/s AV VTI:            0.412 m AV Peak Grad:      17.0 mmHg AV Mean Grad:      10.0 mmHg LVOT Vmax:         124.00 cm/s LVOT Vmean:        84.800 cm/s LVOT VTI:          0.270 m LVOT/AV VTI ratio: 0.66  AORTA Ao Root diam: 3.10 cm Ao Asc diam:  3.40 cm  MITRAL VALVE                TRICUSPID VALVE MV Area (PHT): 3.60 cm     TR Peak grad:   17.0 mmHg MV Decel Time: 211 msec     TR Vmax:        206.00 cm/s MV E velocity: 77.50 cm/s MV A velocity: 104.00 cm/s  SHUNTS MV E/A ratio:  0.75         Systemic VTI:  0.27 m Systemic Diam: 2.20 cm  Jerel Croitoru MD Electronically signed by Jerel Balding MD Signature Date/Time: 11/21/2022/5:28:48 PM  Final          ______________________________________________________________________________________________     Recent Labs: 10/30/2022: BNP 4.3; Hemoglobin 12.5; Platelets 319 09/24/2023: ALT 20; BUN 14; Creatinine, Ser 0.88; Potassium 4.2; Sodium 141  Recent Lipid Panel    Component Value Date/Time   CHOL 182 09/24/2023 0820   TRIG 80 09/24/2023 0820   HDL 46 09/24/2023 0820   CHOLHDL 4.0 09/24/2023 0820   CHOLHDL 3.3 08/22/2015 0839   VLDL 14 08/22/2015 0839   LDLCALC 121 (H) 09/24/2023 0820    History of Present Illness    58 year old female with the above past medical history including diastolic dysfunction,  dyspnea on exertion, bilateral lower extremity edema, hypertension, hyperlipidemia, hyperthyroidism s/p thyroidectomy, and chronic cough.  She has a history of diastolic dysfunction noted on prior echocardiogram in 2018.  Most recent echocardiogram in 11/2022 showed EF 65%, normal LV function, no RWMA, mild concentric LVH, G1 DD, normal RV systolic function, normal PASP, no significant valvular abnormalities. She was evaluated by pulmonology, PFTs were normal. She is pending sleep study.  She was referred to cardiology the setting of shortness of breath on exertion, chronic cough, and bilateral lower extremity edema.   She presents today for heart first clinic new patient evaluation. She reports a greater than 2-year history of shortness of breath with activity, particularly when walking up stairs or hills.  She also reports nonpitting dependent bilateral lower extremity edema.  She denies chest pain, palpitations, dizziness, presyncope, syncope, PND, orthopnea, or weight gain. Despite her symptoms, she exercises regularly.  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  1 cup of coffee a day.  She denies any alcohol or tobacco use.  Home Medications    Current Outpatient Medications  Medication Sig Dispense Refill   albuterol  (VENTOLIN  HFA) 108 (90 Base) MCG/ACT inhaler Inhale 2 puffs into the lungs every 6 (six) hours as needed for wheezing or shortness of breath. 8 g 2   amLODipine  (NORVASC ) 10 MG tablet TAKE 1 TABLET BY MOUTH AT NIGHT 90 tablet 1   atorvastatin  (LIPITOR) 20 MG tablet Take 1 tablet (20 mg total) by mouth daily. 90 tablet 3   meloxicam  (MOBIC ) 15 MG tablet TAKE 1 TABLET(15 MG) BY MOUTH DAILY (Patient taking differently: as needed.) 90 tablet 0   metoprolol  tartrate (LOPRESSOR ) 100 MG tablet Take 1 tablet 2 hours prior to cardiac CT 1 tablet 0   metoprolol  tartrate (LOPRESSOR ) 25 MG tablet Take 1 tablet (25 mg total) by mouth 2 (two) times daily. 180 tablet 3    montelukast  (SINGULAIR ) 10 MG tablet Take 1 tablet (10 mg total) by mouth at bedtime. 30 tablet 11   oxybutynin  (DITROPAN -XL) 5 MG 24 hr tablet Take 1 tablet (5 mg total) by mouth at bedtime. 30 tablet 3   No current facility-administered medications for this visit.     Review of Systems    She denies chest pain, palpitations, pnd, orthopnea, n, v, dizziness, syncope, weight gain, or early satiety. All other systems reviewed and are otherwise negative except as noted above.   Physical Exam    VS:  BP 126/74   Pulse 68   Ht 5' 5 (1.651 m)   Wt 245 lb 3.2 oz (111.2 kg)   LMP  (LMP Unknown) Comment: pt states 1.5 years ago   SpO2 97%   BMI 40.80 kg/m  GEN: Well nourished, well developed, in no acute distress. HEENT: normal. Neck: Supple, no JVD, carotid bruits, or  masses. Cardiac: RRR, no murmurs, rubs, or gallops. No clubbing, cyanosis, edema.  Radials/DP/PT 2+ and equal bilaterally.  Respiratory:  Respirations regular and unlabored, clear to auscultation bilaterally. GI: Soft, nontender, nondistended, BS + x 4. MS: no deformity or atrophy. Skin: warm and dry, no rash. Neuro:  Strength and sensation are intact. Psych: Normal affect.  Accessory Clinical Findings    ECG personally reviewed by me today - EKG Interpretation Date/Time:  Wednesday September 23 2023 08:04:15 EDT Ventricular Rate:  68 PR Interval:  188 QRS Duration:  96 QT Interval:  396 QTC Calculation: 421 R Axis:   57  Text Interpretation: Normal sinus rhythm Normal ECG When compared with ECG of 06-Sep-2010 16:21, Vent. rate has decreased BY  56 BPM Confirmed by Daneen Perkins ([id number]) on 09/23/2023 8:18:17 AM  - no acute changes.   Lab Results  Component Value Date   WBC 6.1 10/30/2022   HGB 12.5 10/30/2022   HCT 40.2 10/30/2022   MCV 84 10/30/2022   PLT 319 10/30/2022   Lab Results  Component Value Date   CREATININE 0.88 09/24/2023   BUN 14 09/24/2023   NA 141 09/24/2023   K 4.2 09/24/2023   CL 104  09/24/2023   CO2 22 09/24/2023   Lab Results  Component Value Date   ALT 20 09/24/2023   AST 19 09/24/2023   ALKPHOS 69 09/24/2023   BILITOT 0.3 09/24/2023   Lab Results  Component Value Date   CHOL 182 09/24/2023   HDL 46 09/24/2023   LDLCALC 121 (H) 09/24/2023   TRIG 80 09/24/2023   CHOLHDL 4.0 09/24/2023    Lab Results  Component Value Date   HGBA1C 5.9 06/10/2023    Assessment & Plan    1. Diastolic dysfunction/dyspnea on exertion/bilateral lower extremity edema: Most recent echocardiogram in 11/2022 showed EF 65%, normal LV function, no RWMA, mild concentric LVH, G1 DD, normal RV systolic function, normal PASP, no significant valvular abnormalities. Following with pulmonology. PFTs were unremarkable.  She reports a greater than 2-year history of shortness of breath with activity, particularly when walking up stairs or climbing hills. She has nonpitting dependent bilateral lower extremity edema, otherwise euvolemic and well compensated on exam. She denies chest pain. Recent echo/pulmonary workup reassuring. Through shared decision making, will pursue coronary CT angiogram to rule out ischemic cause of symptoms.   2. Hypertension: BP well controlled. Lower extremity edema possibly in the setting of amlodipine .  BP is well-controlled, would defer any medication changes at this time. Can consider in the future if necessary.  For now, continue metoprolol , amlodipine .  3. Hyperlipidemia: LDL was 128 in 05/2023.  She was not taking her Lipitor consistently at the time.  She has since resumed Lipitor.  Will update fasting lipids, CMET.  4. Disposition: Follow-up in 6 to 8 weeks.  She wishes to establish with Dr. Sheena.   Perkins JAYSON Daneen, NP 09/26/2023, 10:45 AM

## 2023-09-24 DIAGNOSIS — I5189 Other ill-defined heart diseases: Secondary | ICD-10-CM | POA: Diagnosis not present

## 2023-09-24 DIAGNOSIS — E782 Mixed hyperlipidemia: Secondary | ICD-10-CM | POA: Diagnosis not present

## 2023-09-24 DIAGNOSIS — I10 Essential (primary) hypertension: Secondary | ICD-10-CM | POA: Diagnosis not present

## 2023-09-24 DIAGNOSIS — R0609 Other forms of dyspnea: Secondary | ICD-10-CM | POA: Diagnosis not present

## 2023-09-24 DIAGNOSIS — R600 Localized edema: Secondary | ICD-10-CM | POA: Diagnosis not present

## 2023-09-24 LAB — LIPID PANEL

## 2023-09-25 LAB — COMPREHENSIVE METABOLIC PANEL WITH GFR
ALT: 20 IU/L (ref 0–32)
AST: 19 IU/L (ref 0–40)
Albumin: 4.3 g/dL (ref 3.8–4.9)
Alkaline Phosphatase: 69 IU/L (ref 44–121)
BUN/Creatinine Ratio: 16 (ref 9–23)
BUN: 14 mg/dL (ref 6–24)
Bilirubin Total: 0.3 mg/dL (ref 0.0–1.2)
CO2: 22 mmol/L (ref 20–29)
Calcium: 9.4 mg/dL (ref 8.7–10.2)
Chloride: 104 mmol/L (ref 96–106)
Creatinine, Ser: 0.88 mg/dL (ref 0.57–1.00)
Globulin, Total: 2.5 g/dL (ref 1.5–4.5)
Glucose: 123 mg/dL — ABNORMAL HIGH (ref 70–99)
Potassium: 4.2 mmol/L (ref 3.5–5.2)
Sodium: 141 mmol/L (ref 134–144)
Total Protein: 6.8 g/dL (ref 6.0–8.5)
eGFR: 76 mL/min/1.73 (ref >59)

## 2023-09-25 LAB — LIPID PANEL
Cholesterol, Total: 182 mg/dL (ref 100–199)
HDL: 46 mg/dL (ref >39)
LDL CALC COMMENT:: 4 ratio (ref 0.0–4.4)
LDL Chol Calc (NIH): 121 mg/dL — AB (ref 0–99)
Triglycerides: 80 mg/dL (ref 0–149)
VLDL Cholesterol Cal: 15 mg/dL (ref 5–40)

## 2023-09-26 ENCOUNTER — Encounter: Payer: Self-pay | Admitting: Nurse Practitioner

## 2023-09-28 ENCOUNTER — Other Ambulatory Visit: Payer: Self-pay

## 2023-09-28 ENCOUNTER — Ambulatory Visit: Payer: Self-pay | Admitting: Nurse Practitioner

## 2023-09-28 DIAGNOSIS — H401134 Primary open-angle glaucoma, bilateral, indeterminate stage: Secondary | ICD-10-CM | POA: Diagnosis not present

## 2023-09-28 DIAGNOSIS — E7800 Pure hypercholesterolemia, unspecified: Secondary | ICD-10-CM

## 2023-09-28 DIAGNOSIS — Z79899 Other long term (current) drug therapy: Secondary | ICD-10-CM

## 2023-09-28 MED ORDER — ATORVASTATIN CALCIUM 40 MG PO TABS: 40.0000 mg | ORAL_TABLET | Freq: Every day | ORAL | 3 refills | Status: AC

## 2023-09-29 ENCOUNTER — Ambulatory Visit (HOSPITAL_COMMUNITY)
Admission: RE | Admit: 2023-09-29 | Discharge: 2023-09-29 | Disposition: A | Discharge status: Home / Self Care | Source: Ambulatory Visit | Attending: Nurse Practitioner | Admitting: Nurse Practitioner

## 2023-09-29 DIAGNOSIS — R0609 Other forms of dyspnea: Secondary | ICD-10-CM | POA: Diagnosis not present

## 2023-09-29 DIAGNOSIS — I5189 Other ill-defined heart diseases: Secondary | ICD-10-CM | POA: Insufficient documentation

## 2023-09-29 DIAGNOSIS — I700 Atherosclerosis of aorta: Secondary | ICD-10-CM | POA: Diagnosis not present

## 2023-09-29 MED ORDER — IOHEXOL 350 MG/ML SOLN
100.0000 mL | Freq: Once | INTRAVENOUS | Status: AC | PRN
  Administered 2023-09-29: 100 mL via INTRAVENOUS

## 2023-09-29 MED ORDER — NITROGLYCERIN 0.4 MG SL SUBL
0.8000 mg | SUBLINGUAL_TABLET | Freq: Once | SUBLINGUAL | Status: AC
  Administered 2023-09-29: 0.8 mg via SUBLINGUAL

## 2023-09-29 NOTE — Progress Notes (Signed)
 Patient presents for CCTA.  IV extravasated at the end of the study, study was completed.   Mild swelling noted to right AC, no redness, patient denies numbness or tingling, +2 radial pulse.   Pressure dressing and ice applied.   Precautions discussed and education sheet provided.  Patient verbalized understanding.

## 2023-09-30 ENCOUNTER — Telehealth (HOSPITAL_COMMUNITY): Payer: Self-pay | Admitting: *Deleted

## 2023-09-30 NOTE — Telephone Encounter (Signed)
 Attempted to call patient to follow up on IV extravasation-left message to call back.

## 2023-10-06 ENCOUNTER — Ambulatory Visit (INDEPENDENT_AMBULATORY_CARE_PROVIDER_SITE_OTHER): Admitting: Family Medicine

## 2023-10-06 ENCOUNTER — Ambulatory Visit: Admitting: Neurology

## 2023-10-06 ENCOUNTER — Encounter (HOSPITAL_BASED_OUTPATIENT_CLINIC_OR_DEPARTMENT_OTHER): Payer: Self-pay | Admitting: Family Medicine

## 2023-10-06 VITALS — BP 139/72 | HR 76 | Ht 65.0 in | Wt 249.0 lb

## 2023-10-06 DIAGNOSIS — R0683 Snoring: Secondary | ICD-10-CM | POA: Diagnosis not present

## 2023-10-06 DIAGNOSIS — Z9189 Other specified personal risk factors, not elsewhere classified: Secondary | ICD-10-CM

## 2023-10-06 DIAGNOSIS — H401190 Primary open-angle glaucoma, unspecified eye, stage unspecified: Secondary | ICD-10-CM | POA: Insufficient documentation

## 2023-10-06 DIAGNOSIS — M1991 Primary osteoarthritis, unspecified site: Secondary | ICD-10-CM | POA: Diagnosis not present

## 2023-10-06 DIAGNOSIS — R351 Nocturia: Secondary | ICD-10-CM

## 2023-10-06 DIAGNOSIS — I5030 Unspecified diastolic (congestive) heart failure: Secondary | ICD-10-CM

## 2023-10-06 DIAGNOSIS — Z8639 Personal history of other endocrine, nutritional and metabolic disease: Secondary | ICD-10-CM | POA: Diagnosis not present

## 2023-10-06 DIAGNOSIS — R0681 Apnea, not elsewhere classified: Secondary | ICD-10-CM

## 2023-10-06 DIAGNOSIS — G4733 Obstructive sleep apnea (adult) (pediatric): Secondary | ICD-10-CM

## 2023-10-06 DIAGNOSIS — R7303 Prediabetes: Secondary | ICD-10-CM | POA: Diagnosis not present

## 2023-10-06 DIAGNOSIS — G4720 Circadian rhythm sleep disorder, unspecified type: Secondary | ICD-10-CM

## 2023-10-06 DIAGNOSIS — G4719 Other hypersomnia: Secondary | ICD-10-CM

## 2023-10-06 MED ORDER — MELOXICAM 15 MG PO TABS: 15.0000 mg | ORAL_TABLET | ORAL | 2 refills | Status: AC | PRN

## 2023-10-06 NOTE — Progress Notes (Signed)
 Subjective:   [name] 12-Sep-1965 10/06/2023  Chief Complaint  Patient presents with   Medical Management of Chronic Issues    2-month follow up; pt denies any main concerns for today's visit. States she needs medication refill of meloxicam .     HPI: [name] presents today for re-assessment and management of chronic medical conditions.  NOCTURIA:  Patient reports improvement in nighttime awakening with urinary frequency since starting Ditropan  XL 5mg  in July 2025. She states she is getting up approx. 3-4 times a night which is improved from awakening every half hour to hour. She would like to stay at current dosage.    IMPAIRED FASTING GLUCOSE [name] is here for medical management of impaired fasting glucose.  Patient's current IFG medication regimen is: Diet controlled  Adhering to a diabetic diet: Yes Checking Blood Sugars: No Denies polydipsia, polyphagia, polyuria.  Lab Results  Component Value Date   HGBA1C 5.9 06/10/2023   HISTORY OF HYPERTHYROIDISM:  Patient has hx of hyperthyroidism with goiter in 2012 per chart review and is s/p radioactive iodine ablation. She does not take any thyroid  medication currently and reports she is no longer followed by endocrinology as thyroid  function has been stable since ablation.    CHF: [name] is here for the medical management of congestive heart failure. Patient recently seen on 09/23/2023 for history of diastolic dysfunction, dyspnea on exertion, BLE edema, HTN, HLD. Last Echocardiogram in 11/2022 noted EF 65%, normal LV function, mild concentric LVH and no significant valvular abnormalities. She is in the process of obtaining sleep study. CT Angiogram completed with Cardiology and has follow up in October. Her lipitor has been increased to 40mg  daily. She is tolerating well. BP well controlled.   Cardiologist: Kardie Tobb, DO Current medication: Amlodipine  10mg , Metoprolol  25mg  BID, Atorvastatin  40mg  Patient does   adhere to a low sodium diet.  Patient does check daily weight.    The following portions of the patient's history were reviewed and updated as appropriate: past medical history, past surgical history, family history, social history, allergies, medications, and problem list.   Patient Active Problem List   Diagnosis Date Noted   Primary open angle glaucoma (POAG) 10/06/2023   History of hyperthyroidism 10/06/2023   Nocturia more than twice per night 10/06/2023   Primary osteoarthritis 10/06/2023   Prediabetes 05/23/2022   Muscle strain of shoulder region, left, sequela 07/03/2021   Hyperlipidemia 09/24/2016   GERD (gastroesophageal reflux disease) 10/09/2014   Hyperthyroidism 11/29/2010   Essential hypertension 03/28/2010   Diastolic heart failure (HCC) 03/28/2010   Past Medical History:  Diagnosis Date   Allergy    Back pain 06/05/2021   Bilateral leg cramps 03/28/2020   Cough due to angiotensin-converting enzyme inhibitor 05/27/2010   Diastolic heart failure 02/2010   NEW ON ECHO 02/2010   Diastolic heart failure (HCC) 11/29/2010   ECHO performed 09/09/10 Left ventricle: The cavity size was normal. There was mild     concentric hypertrophy. Systolic function was vigorous. The     estimated ejection fraction was in the range of 65% to 70%. Wall     motion was normal; there were no regional wall motion     abnormalities. Features are consistent with a pseudonormal left     ventricular filling pattern, with concomitant abnormal   DUB (dysfunctional uterine bleeding) 04/04/2011   03/2011 U/S:Small fibroids, one of which demonstrates mass effect upon the endometrium and could correlate with history of dysfunctional uterine bleeding. Endometrial  Biopsy 07/2012 Secretory endometrium with no hyperplasia or carcinoma.  07/2012 pap smear negative for intraepithelial neoplasia. FSH on menopausal range LH, TSH, Prolactin and Testosterone  wnl.      Hemoptysis 02/2010   DUE TO ALV HGE.SABRASABRAANA  NEGATIVE...DEEMED DUE TO DIASTOLIC CHF   HEMOPTYSIS UNSPECIFIED 03/28/2010   Followed in Pulmonary clinic/ [facility]/ [facility] med regimen --med calendar 10/10/2010  3 episodes all related to hypertension with diastolic chf - last august 2012. CXR clear 11/21/10     History of anemia 11/29/2010   Last Hb 10.1 on 08/21 baseline around 9.    Hypertension    DX SEVERAL DECADES YRS AGO- UNTRREATED UNTIL ADMISSION 02/2010   Hyperthyroidism    Neck fullness 10/28/2017   Pollen-food allergy 11/05/2020   PPD positive    NEGATIVE DURING CHILDHOOD IN [employer]1ST + TEST 1993 DURING IMMIGRATION GRREN CARD CLEARENCE.SABRACXR NORMAL 1993 PER HX..s/p INR Rx AT [facility].SABRAREPEAT + 02/2010   Right knee pain 05/06/2011   Seasonal and perennial allergic rhinoconjunctivitis 11/05/2020   Vitamin B12 deficiency 09/25/2016   Vitamin D  deficiency 09/24/2016   Past Surgical History:  Procedure Laterality Date   CESAREAN SECTION     TUBAL LIGATION     Family History  Problem Relation Age of Onset   Hypertension Father    Sudden death Father    Hyperlipidemia Neg Hx    Heart attack Neg Hx    Diabetes Neg Hx    Colon cancer Neg Hx    Colon polyps Neg Hx    Esophageal cancer Neg Hx    Stomach cancer Neg Hx    Rectal cancer Neg Hx    Asthma Neg Hx    Outpatient Medications Prior to Visit  Medication Sig Dispense Refill   albuterol  (VENTOLIN  HFA) 108 (90 Base) MCG/ACT inhaler Inhale 2 puffs into the lungs every 6 (six) hours as needed for wheezing or shortness of breath. 8 g 2   amLODipine  (NORVASC ) 10 MG tablet TAKE 1 TABLET BY MOUTH AT NIGHT 90 tablet 1   atorvastatin  (LIPITOR) 40 MG tablet Take 1 tablet (40 mg total) by mouth daily. 90 tablet 3   metoprolol  tartrate (LOPRESSOR ) 100 MG tablet Take 1 tablet 2 hours prior to cardiac CT 1 tablet 0   metoprolol  tartrate (LOPRESSOR ) 25 MG tablet Take 1 tablet (25 mg total) by mouth 2 (two) times daily. 180 tablet 3    montelukast  (SINGULAIR ) 10 MG tablet Take 1 tablet (10 mg total) by mouth at bedtime. 30 tablet 11   oxybutynin  (DITROPAN -XL) 5 MG 24 hr tablet Take 1 tablet (5 mg total) by mouth at bedtime. 30 tablet 3   meloxicam  (MOBIC ) 15 MG tablet TAKE 1 TABLET(15 MG) BY MOUTH DAILY (Patient taking differently: as needed.) 90 tablet 0   No facility-administered medications prior to visit.   Allergies  Allergen Reactions   Crestor  [Rosuvastatin ] Other (See Comments)    Headache      ROS: A complete ROS was performed with pertinent positives/negatives noted in the HPI. The remainder of the ROS are negative.    Objective:   Today's Vitals   10/06/23 1326 10/06/23 1402  BP: (!) 142/78 139/72  Pulse: 76   SpO2: 99%   Weight: 249 lb (112.9 kg)   Height: 5' 5 (1.651 m)     Physical Exam   GENERAL: Well-appearing, in NAD. Well nourished.  SKIN: Pink, warm and dry.  Head: Normocephalic. NECK: Trachea midline. Full ROM w/o pain or  tenderness.  RESPIRATORY: Chest wall symmetrical. Respirations even and non-labored. Breath sounds clear to auscultation bilaterally.  CARDIAC: S1, S2 present, regular rate and rhythm with grade I systolic murmur. No gallops. Peripheral pulses 2+ bilaterally.  MSK: Muscle tone and strength appropriate for age. NEUROLOGIC: No motor or sensory deficits. Steady, even gait. C2-C12 intact.  PSYCH/MENTAL STATUS: Alert, oriented x 3. Cooperative, appropriate mood and affect.     Assessment & Plan:  1. Nocturia more than twice per night (Primary) Improved. Offered titration of medication, patient declined currently. Will notify PCP if desired. Recent renal function from Sept 2025 reviewed by PCP.   2. Prediabetes Will check A1C with labs today. Doing well with diet currently.  - Hemoglobin A1c  3. History of hyperthyroidism Controlled previously. Will check TSH and continue to monitor s/p ablation.  - TSH  4. Diastolic heart failure, unspecified HF chronicity  (HCC) Currently well controlled per patient. BP well controlled and patietn compliant with medications. Continue management by Cardiology.   5. Primary osteoarthritis, unspecified site Pt requesting refill of Mobic  15mg . Discussed safe use PRN of this medication due to renal/GI side effects and she verbalized understanding.    Meds ordered this encounter  Medications   meloxicam  (MOBIC ) 15 MG tablet    Sig: Take 1 tablet (15 mg total) by mouth as needed for pain.    Dispense:  90 tablet    Refill:  2    Supervising Provider:   DE PERU, RAYMOND J [[id number]]   Lab Orders         Hemoglobin A1c         TSH     Return in about 8 months (around 06/10/2024) for Annual Exam (fasting labs at visit) .    Patient to reach out to office if new, worrisome, or unresolved symptoms arise or if no improvement in patient's condition. Patient verbalized understanding and is agreeable to treatment plan. All questions answered to patient's satisfaction.    Thersia [name] Stark, [place]

## 2023-10-07 LAB — HEMOGLOBIN A1C
Est. average glucose Bld gHb Est-mCnc: 128 mg/dL
Hgb A1c MFr Bld: 6.1 % — ABNORMAL HIGH (ref 4.8–5.6)

## 2023-10-07 LAB — TSH: TSH: 2.02 u[IU]/mL (ref 0.450–4.500)

## 2023-10-08 ENCOUNTER — Ambulatory Visit (HOSPITAL_BASED_OUTPATIENT_CLINIC_OR_DEPARTMENT_OTHER): Payer: Self-pay | Admitting: Family Medicine

## 2023-10-08 NOTE — Progress Notes (Signed)
 Hi [name],  Your A1C has increased slightly from May and is up to 6.1. At this time, please make dietary changes with reduced sugar and carbohydrate intake. If you would like to consider medication such as Metformin to help, I would be happy to send this in for you. Your thyroid  function was stable. We will plan to repeat at your next visit.

## 2023-10-15 NOTE — Procedures (Unsigned)
 Physician Interpretation: Please see link under Procedure Tab or under Encounters tab for physician report, technical report, as well as O2 titration and/or PAP titration tables (if applicable).   Referred by: Dr. Modena Callander   History and Indication for Testing (obtained from visit note dated 07/23/2023): 58 year old female with an underlying complex medical history of stroke in May 2025, hypertension, hyperlipidemia, coronary artery disease, prior smoking, COPD, history of kidney stone, carotid artery disease with status post bilateral carotid endarterectomies, hypothyroidism, and overweight state, who reports snoring and nocturia. His Epworth sleepiness score is 3 out of 24, fatigue severity score is 15 out of 63.     Review of the EEG showed no abnormal electrical discharges and symmetrical bihemispheric findings.     EKG: The EKG revealed normal sinus rhythm (NSR). ***   AUDIO/VIDEO REVIEW: The audio and video review did not show any abnormal or unusual behaviors, movements, phonations or vocalizations. The patient took *** restroom breaks. Snoring was noted, ***   POST-STUDY QUESTIONNAIRE: Post study, the patient indicated, that sleep was *** the same as usual.    IMPRESSION:    Obstructive Sleep Apnea (OSA), *** ***Central Sleep Apnea (CSA) ***Primary Snoring ***Primary Central Sleep Apnea ***Complex Sleep Apnea ***PLMD (periodic limb movement disorder [of sleep]) ***Dysfunctions associated with sleep stages or arousal from sleep ***Non-specific abnormal electrocardiogram (EKG) ***Poor sleep pattern ***Inconclusive Test   RECOMMENDATIONS:         I certify that I have reviewed the entire raw data recording prior to the issuance of this report in accordance with the Standards of Accreditation of the American Academy of Sleep Medicine (AASM).   True Mar, MD, PhD Medical Director, [facility]) Diplomat, ABPN (Neurology and  Sleep)

## 2023-10-16 ENCOUNTER — Ambulatory Visit: Payer: Self-pay | Admitting: Neurology

## 2023-10-16 DIAGNOSIS — G4733 Obstructive sleep apnea (adult) (pediatric): Secondary | ICD-10-CM

## 2023-10-19 NOTE — Telephone Encounter (Signed)
-----   Message from True Mar sent at 10/16/2023 12:11 PM EDT ----- Patient referred by PCP, seen by me on 09/01/23, patient had a HST on 10/06/23.    Please call and notify the patient that the recent home sleep test showed obstructive sleep apnea in the moderate to severe range. I recommend treatment in the form of autoPAP, which means, that we  don't have to bring her in for a sleep study with CPAP, but will let her start using a so called autoPAP machine at home, which is a CPAP-like machine with self-adjusting pressures. We will send the  order to a local DME company (of her choice, or as per insurance requirement). The DME representative will fit her with a mask, educate her on how to use the machine, how to put the mask on, etc. I  have placed an order in the chart. Please send the order, talk to patient, send report to referring MD. We will need a FU in sleep clinic for 10 weeks post-PAP set up, please arrange that with me or  one of our NPs. Also reinforce the need for compliance with treatment. Thanks,   True Mar, MD, PhD [facility])    ----- Message ----- From: Rebecka Fleeta Higashi In One Three One Sent: 10/16/2023  12:10 PM EDT To: True Mar, MD

## 2023-10-19 NOTE — Telephone Encounter (Signed)
 RE: new pap user mod-severe Received: Today Zott, Glade Salt, Nena RAMAN, RN; Lavon Milling; Darrel Boyer Got It Thank you     Previous Messages    ----- Message ----- From: Salt Nena RAMAN, RN Sent: 10/19/2023   2:13 PM EDT To: Milling Donnice Boyer Darrel; Glade Zott Subject: new pap user mod-severe                        New order for PAP therapy,    Jacquetta Hammans Female, 58 y.o., 1965-03-03 Pronouns: she/her/hers MRN: [id number] Phone: [phone]   Thank you, Sa

## 2023-10-19 NOTE — Telephone Encounter (Signed)
 Relayed results of sleep study to pt as per below.  DME [facility].  They will authorize then call her.  Once machine available they will call her to pick up.  We see back 2-3 months for insurance compliance.  She will call to set up appt.  She was ok to proceed.

## 2023-10-27 ENCOUNTER — Other Ambulatory Visit: Payer: Self-pay | Admitting: Nurse Practitioner

## 2023-10-29 NOTE — Telephone Encounter (Signed)
 Signed sleep study faxed to [facility]/fax confirmation received.

## 2023-11-09 ENCOUNTER — Ambulatory Visit: Admitting: Nurse Practitioner

## 2023-11-17 ENCOUNTER — Ambulatory Visit: Admitting: Nurse Practitioner

## 2023-12-02 ENCOUNTER — Telehealth (HOSPITAL_BASED_OUTPATIENT_CLINIC_OR_DEPARTMENT_OTHER): Payer: Self-pay

## 2023-12-02 ENCOUNTER — Other Ambulatory Visit (HOSPITAL_BASED_OUTPATIENT_CLINIC_OR_DEPARTMENT_OTHER): Payer: Self-pay | Admitting: Family Medicine

## 2023-12-02 DIAGNOSIS — I10 Essential (primary) hypertension: Secondary | ICD-10-CM

## 2023-12-02 MED ORDER — METOPROLOL TARTRATE 25 MG PO TABS: 25.0000 mg | ORAL_TABLET | Freq: Two times a day (BID) | ORAL | 3 refills | Status: AC

## 2023-12-02 NOTE — Telephone Encounter (Signed)
 Patient called and left a message on the voicemail stating she needs a refill on her Metoprolol . Please review. Thanks, tbw

## 2023-12-10 NOTE — Telephone Encounter (Signed)
 RE: please reactivate cpap order Received: Today Zott, Glade Salt, Nena RAMAN, RN; [place], Tammy; Zott, Ashley; Darrel Boyer We will reprocess the order. Thanks.       Previous Messages    ----- Message ----- From: Salt Nena RAMAN, RN Sent: 12/10/2023   3:42 PM EST To: Madelin Donnice Boyer Darrel; Glade Zott Subject: please reactivate cpap order                  Good afternoon,  He would like to proceed with getting cpap, can you reactivate his order?  Jacquetta Hammans Female, 58 y.o., 07-02-1965 Pronouns: she/her/hers MRN: [id number] Thanks  Particia SAUNDERS

## 2023-12-16 DIAGNOSIS — H401134 Primary open-angle glaucoma, bilateral, indeterminate stage: Secondary | ICD-10-CM | POA: Diagnosis not present

## 2024-02-02 ENCOUNTER — Telehealth: Payer: Self-pay

## 2024-02-02 DIAGNOSIS — I10 Essential (primary) hypertension: Secondary | ICD-10-CM

## 2024-02-02 NOTE — Progress Notes (Signed)
 Complex Care Management Note Care Guide Note  02/02/2024 Name: [name] MRN: [id number] DOB: March 14, 1965   Complex Care Management Outreach Attempts: An unsuccessful telephone outreach was attempted today to offer the patient information about available complex care management services.  Follow Up Plan:  Additional outreach attempts will be made to offer the patient complex care management information and services.   Encounter Outcome:  No Answer  Dreama [facility]  [facility], [facility] VBCI Assistant Direct Dial: [phone]  Fax: [phone]

## 2024-02-08 NOTE — Progress Notes (Signed)
 Complex Care Management Note  Care Guide Note 02/08/2024 Name: [name] MRN: [id number] DOB: [birth year]  [name] is a 59 y.o. year old female who sees Caudle, Thersia Bitters, FNP for primary care. I reached out to Jacquetta Hammans by phone today to offer complex care management services.  [name] was given information about Complex Care Management services today including:   The Complex Care Management services include support from the care team which includes your Nurse Care Manager, Clinical Social Worker, or Pharmacist.  The Complex Care Management team is here to help remove barriers to the health concerns and goals most important to you. Complex Care Management services are voluntary, and the patient may decline or stop services at any time by request to their care team member.   Complex Care Management Consent Status: Patient did not agree to participate in complex care management services at this time.  Follow up plan:  Will follow up with PCP  Encounter Outcome:  Patient Refused  [name]  [facility], [facility] VBCI Assistant Direct Dial: [phone]  Fax: [phone]

## 2024-02-21 ENCOUNTER — Other Ambulatory Visit: Payer: Self-pay | Admitting: Family Medicine

## 2024-02-21 DIAGNOSIS — I10 Essential (primary) hypertension: Secondary | ICD-10-CM

## 2024-03-03 ENCOUNTER — Ambulatory Visit: Admitting: Neurology

## 2024-06-14 ENCOUNTER — Encounter (HOSPITAL_BASED_OUTPATIENT_CLINIC_OR_DEPARTMENT_OTHER): Admitting: Family Medicine
